# Patient Record
Sex: Male | Born: 1975 | Race: Black or African American | Hispanic: No | Smoking: Current every day smoker
Health system: Southern US, Community
[De-identification: ages and names within clinical notes are randomized; demographics above are authoritative.]

## PROBLEM LIST (undated history)

## (undated) DIAGNOSIS — I472 Ventricular tachycardia, unspecified: Secondary | ICD-10-CM

## (undated) DIAGNOSIS — I5082 Biventricular heart failure: Secondary | ICD-10-CM

## (undated) DIAGNOSIS — F1010 Alcohol abuse, uncomplicated: Secondary | ICD-10-CM

## (undated) DIAGNOSIS — K7460 Unspecified cirrhosis of liver: Secondary | ICD-10-CM

## (undated) DIAGNOSIS — I509 Heart failure, unspecified: Secondary | ICD-10-CM

## (undated) DIAGNOSIS — I10 Essential (primary) hypertension: Secondary | ICD-10-CM

---

## 2015-05-18 HISTORY — PX: ICD IMPLANT: EP1208

## 2020-09-03 ENCOUNTER — Encounter (HOSPITAL_COMMUNITY): Payer: Self-pay

## 2020-09-03 ENCOUNTER — Emergency Department (HOSPITAL_COMMUNITY): Payer: Medicare Other

## 2020-09-03 ENCOUNTER — Inpatient Hospital Stay (HOSPITAL_COMMUNITY)
Admission: EM | Admit: 2020-09-03 | Discharge: 2020-09-10 | DRG: 314 | Disposition: A | Discharge status: Home / Self Care | Payer: Medicare Other | Attending: Internal Medicine | Admitting: Internal Medicine

## 2020-09-03 DIAGNOSIS — Z95810 Presence of automatic (implantable) cardiac defibrillator: Secondary | ICD-10-CM | POA: Diagnosis not present

## 2020-09-03 DIAGNOSIS — E876 Hypokalemia: Secondary | ICD-10-CM | POA: Diagnosis not present

## 2020-09-03 DIAGNOSIS — G4700 Insomnia, unspecified: Secondary | ICD-10-CM | POA: Diagnosis present

## 2020-09-03 DIAGNOSIS — Z888 Allergy status to other drugs, medicaments and biological substances status: Secondary | ICD-10-CM

## 2020-09-03 DIAGNOSIS — F10288 Alcohol dependence with other alcohol-induced disorder: Secondary | ICD-10-CM | POA: Diagnosis present

## 2020-09-03 DIAGNOSIS — I472 Ventricular tachycardia: Secondary | ICD-10-CM | POA: Diagnosis present

## 2020-09-03 DIAGNOSIS — E1165 Type 2 diabetes mellitus with hyperglycemia: Secondary | ICD-10-CM | POA: Diagnosis not present

## 2020-09-03 DIAGNOSIS — R069 Unspecified abnormalities of breathing: Secondary | ICD-10-CM

## 2020-09-03 DIAGNOSIS — Z5900 Homelessness unspecified: Secondary | ICD-10-CM

## 2020-09-03 DIAGNOSIS — I110 Hypertensive heart disease with heart failure: Secondary | ICD-10-CM | POA: Diagnosis present

## 2020-09-03 DIAGNOSIS — R002 Palpitations: Secondary | ICD-10-CM | POA: Diagnosis present

## 2020-09-03 DIAGNOSIS — I5043 Acute on chronic combined systolic (congestive) and diastolic (congestive) heart failure: Secondary | ICD-10-CM | POA: Diagnosis present

## 2020-09-03 DIAGNOSIS — I420 Dilated cardiomyopathy: Secondary | ICD-10-CM | POA: Diagnosis not present

## 2020-09-03 DIAGNOSIS — G4733 Obstructive sleep apnea (adult) (pediatric): Secondary | ICD-10-CM | POA: Diagnosis present

## 2020-09-03 DIAGNOSIS — M25512 Pain in left shoulder: Secondary | ICD-10-CM | POA: Diagnosis present

## 2020-09-03 DIAGNOSIS — I428 Other cardiomyopathies: Secondary | ICD-10-CM | POA: Diagnosis not present

## 2020-09-03 DIAGNOSIS — Z20822 Contact with and (suspected) exposure to covid-19: Secondary | ICD-10-CM | POA: Diagnosis present

## 2020-09-03 DIAGNOSIS — I5082 Biventricular heart failure: Secondary | ICD-10-CM | POA: Diagnosis present

## 2020-09-03 DIAGNOSIS — Z6831 Body mass index (BMI) 31.0-31.9, adult: Secondary | ICD-10-CM | POA: Diagnosis not present

## 2020-09-03 DIAGNOSIS — Z9119 Patient's noncompliance with other medical treatment and regimen: Secondary | ICD-10-CM

## 2020-09-03 DIAGNOSIS — I4901 Ventricular fibrillation: Secondary | ICD-10-CM | POA: Diagnosis present

## 2020-09-03 DIAGNOSIS — F10231 Alcohol dependence with withdrawal delirium: Secondary | ICD-10-CM | POA: Diagnosis present

## 2020-09-03 DIAGNOSIS — R079 Chest pain, unspecified: Secondary | ICD-10-CM | POA: Diagnosis present

## 2020-09-03 DIAGNOSIS — R569 Unspecified convulsions: Secondary | ICD-10-CM | POA: Diagnosis present

## 2020-09-03 DIAGNOSIS — Z79899 Other long term (current) drug therapy: Secondary | ICD-10-CM

## 2020-09-03 DIAGNOSIS — F17210 Nicotine dependence, cigarettes, uncomplicated: Secondary | ICD-10-CM | POA: Diagnosis present

## 2020-09-03 DIAGNOSIS — I77810 Thoracic aortic ectasia: Secondary | ICD-10-CM | POA: Diagnosis present

## 2020-09-03 DIAGNOSIS — Z7982 Long term (current) use of aspirin: Secondary | ICD-10-CM

## 2020-09-03 DIAGNOSIS — F1020 Alcohol dependence, uncomplicated: Secondary | ICD-10-CM | POA: Diagnosis not present

## 2020-09-03 DIAGNOSIS — I4721 Torsades de pointes: Secondary | ICD-10-CM

## 2020-09-03 DIAGNOSIS — I426 Alcoholic cardiomyopathy: Principal | ICD-10-CM | POA: Diagnosis present

## 2020-09-03 DIAGNOSIS — F411 Generalized anxiety disorder: Secondary | ICD-10-CM | POA: Diagnosis present

## 2020-09-03 DIAGNOSIS — Z79890 Hormone replacement therapy (postmenopausal): Secondary | ICD-10-CM

## 2020-09-03 DIAGNOSIS — R7401 Elevation of levels of liver transaminase levels: Secondary | ICD-10-CM | POA: Diagnosis present

## 2020-09-03 DIAGNOSIS — I5023 Acute on chronic systolic (congestive) heart failure: Secondary | ICD-10-CM | POA: Diagnosis not present

## 2020-09-03 DIAGNOSIS — N179 Acute kidney failure, unspecified: Secondary | ICD-10-CM | POA: Diagnosis present

## 2020-09-03 DIAGNOSIS — Z8249 Family history of ischemic heart disease and other diseases of the circulatory system: Secondary | ICD-10-CM

## 2020-09-03 DIAGNOSIS — I479 Paroxysmal tachycardia, unspecified: Secondary | ICD-10-CM | POA: Diagnosis not present

## 2020-09-03 DIAGNOSIS — E669 Obesity, unspecified: Secondary | ICD-10-CM | POA: Diagnosis present

## 2020-09-03 DIAGNOSIS — E039 Hypothyroidism, unspecified: Secondary | ICD-10-CM | POA: Diagnosis present

## 2020-09-03 DIAGNOSIS — R0789 Other chest pain: Secondary | ICD-10-CM | POA: Diagnosis not present

## 2020-09-03 HISTORY — DX: Essential (primary) hypertension: I10

## 2020-09-03 HISTORY — DX: Heart failure, unspecified: I50.9

## 2020-09-03 LAB — RESP PANEL BY RT-PCR (FLU A&B, COVID) ARPGX2
Influenza A by PCR: NEGATIVE
Influenza B by PCR: NEGATIVE
SARS Coronavirus 2 by RT PCR: NEGATIVE

## 2020-09-03 LAB — GLUCOSE, CAPILLARY: Glucose-Capillary: 185 mg/dL — ABNORMAL HIGH (ref 70–99)

## 2020-09-03 LAB — HEPATIC FUNCTION PANEL
ALT: 40 U/L (ref 0–44)
AST: 98 U/L — ABNORMAL HIGH (ref 15–41)
Albumin: 3.5 g/dL (ref 3.5–5.0)
Alkaline Phosphatase: 215 U/L — ABNORMAL HIGH (ref 38–126)
Bilirubin, Direct: 0.5 mg/dL — ABNORMAL HIGH (ref 0.0–0.2)
Indirect Bilirubin: 1.1 mg/dL — ABNORMAL HIGH (ref 0.3–0.9)
Total Bilirubin: 1.6 mg/dL — ABNORMAL HIGH (ref 0.3–1.2)
Total Protein: 6.7 g/dL (ref 6.5–8.1)

## 2020-09-03 LAB — BASIC METABOLIC PANEL
Anion gap: 26 — ABNORMAL HIGH (ref 5–15)
BUN: 13 mg/dL (ref 6–20)
CO2: 29 mmol/L (ref 22–32)
Calcium: 7.2 mg/dL — ABNORMAL LOW (ref 8.9–10.3)
Chloride: 83 mmol/L — ABNORMAL LOW (ref 98–111)
Creatinine, Ser: 1.65 mg/dL — ABNORMAL HIGH (ref 0.61–1.24)
GFR, Estimated: 52 mL/min — ABNORMAL LOW (ref >60)
Glucose, Bld: 161 mg/dL — ABNORMAL HIGH (ref 70–99)
Potassium: 3.1 mmol/L — ABNORMAL LOW (ref 3.5–5.1)
Sodium: 138 mmol/L (ref 135–145)

## 2020-09-03 LAB — CBC
HCT: 39.9 % (ref 39.0–52.0)
Hemoglobin: 13.6 g/dL (ref 13.0–17.0)
MCH: 28.3 pg (ref 26.0–34.0)
MCHC: 34.1 g/dL (ref 30.0–36.0)
MCV: 83.1 fL (ref 80.0–100.0)
Platelets: 206 10*3/uL (ref 150–400)
RBC: 4.8 MIL/uL (ref 4.22–5.81)
RDW: 16.3 % — ABNORMAL HIGH (ref 11.5–15.5)
WBC: 5.7 10*3/uL (ref 4.0–10.5)
nRBC: 1.2 % — ABNORMAL HIGH (ref 0.0–0.2)

## 2020-09-03 LAB — I-STAT CHEM 8, ED
BUN: 17 mg/dL (ref 6–20)
Calcium, Ion: 0.67 mmol/L — CL (ref 1.15–1.40)
Chloride: 84 mmol/L — ABNORMAL LOW (ref 98–111)
Creatinine, Ser: 1.6 mg/dL — ABNORMAL HIGH (ref 0.61–1.24)
Glucose, Bld: 164 mg/dL — ABNORMAL HIGH (ref 70–99)
HCT: 45 % (ref 39.0–52.0)
Hemoglobin: 15.3 g/dL (ref 13.0–17.0)
Potassium: 3.3 mmol/L — ABNORMAL LOW (ref 3.5–5.1)
Sodium: 135 mmol/L (ref 135–145)
TCO2: 33 mmol/L — ABNORMAL HIGH (ref 22–32)

## 2020-09-03 LAB — TROPONIN I (HIGH SENSITIVITY): Troponin I (High Sensitivity): 84 ng/L — ABNORMAL HIGH (ref <18)

## 2020-09-03 LAB — MAGNESIUM: Magnesium: 2.5 mg/dL — ABNORMAL HIGH (ref 1.7–2.4)

## 2020-09-03 LAB — TSH: TSH: 6.782 u[IU]/mL — ABNORMAL HIGH (ref 0.350–4.500)

## 2020-09-03 MED ORDER — DOCUSATE SODIUM 100 MG PO CAPS: 100.0000 mg | ORAL_CAPSULE | Freq: Two times a day (BID) | ORAL | Status: DC | PRN

## 2020-09-03 MED ORDER — AMIODARONE HCL IN DEXTROSE 360-4.14 MG/200ML-% IV SOLN
30.0000 mg/h | INTRAVENOUS | Status: DC
Start: 2020-09-04 — End: 2020-09-03

## 2020-09-03 MED ORDER — ASPIRIN EC 81 MG PO TBEC
81.0000 mg | DELAYED_RELEASE_TABLET | Freq: Every day | ORAL | Status: DC
  Administered 2020-09-04 – 2020-09-10 (×7): 81 mg via ORAL
  Filled 2020-09-03 (×7): qty 1

## 2020-09-03 MED ORDER — POLYETHYLENE GLYCOL 3350 17 G PO PACK: 17.0000 g | PACK | Freq: Every day | ORAL | Status: DC | PRN

## 2020-09-03 MED ORDER — MAGNESIUM SULFATE 4 GM/100ML IV SOLN: 4.0000 g | Freq: Once | INTRAVENOUS | Status: AC

## 2020-09-03 MED ORDER — MIDAZOLAM HCL 2 MG/2ML IJ SOLN: 1.0000 mg | INTRAMUSCULAR | Status: DC | PRN

## 2020-09-03 MED ORDER — PHENOBARBITAL SODIUM 65 MG/ML IJ SOLN: 65.0000 mg | Freq: Two times a day (BID) | INTRAMUSCULAR | Status: DC

## 2020-09-03 MED ORDER — INSULIN ASPART 100 UNIT/ML IJ SOLN
0.0000 [IU] | INTRAMUSCULAR | Status: DC
  Administered 2020-09-04 – 2020-09-05 (×2): 3 [IU] via SUBCUTANEOUS

## 2020-09-03 MED ORDER — MAGNESIUM SULFATE 50 % IJ SOLN
INTRAMUSCULAR | Status: AC | PRN
  Administered 2020-09-03: 2 g via INTRAVENOUS

## 2020-09-03 MED ORDER — ADULT MULTIVITAMIN W/MINERALS CH
1.0000 | ORAL_TABLET | Freq: Every day | ORAL | Status: DC
  Administered 2020-09-04 – 2020-09-10 (×7): 1 via ORAL
  Filled 2020-09-03 (×7): qty 1

## 2020-09-03 MED ORDER — ONDANSETRON HCL 4 MG/2ML IJ SOLN: 4.0000 mg | Freq: Four times a day (QID) | INTRAMUSCULAR | Status: DC | PRN

## 2020-09-03 MED ORDER — PHENOBARBITAL SODIUM 65 MG/ML IJ SOLN
130.0000 mg | Freq: Three times a day (TID) | INTRAMUSCULAR | Status: DC
  Administered 2020-09-03: 130 mg via INTRAVENOUS
  Filled 2020-09-03 (×2): qty 2

## 2020-09-03 MED ORDER — HEPARIN SODIUM (PORCINE) 5000 UNIT/ML IJ SOLN
5000.0000 [IU] | Freq: Three times a day (TID) | INTRAMUSCULAR | Status: DC
  Administered 2020-09-03 – 2020-09-07 (×11): 5000 [IU] via SUBCUTANEOUS
  Filled 2020-09-03 (×15): qty 1

## 2020-09-03 MED ORDER — MAGNESIUM SULFATE 2 GM/50ML IV SOLN
INTRAVENOUS | Status: AC
  Administered 2020-09-03: 4 mg via INTRAVENOUS
  Filled 2020-09-03: qty 50

## 2020-09-03 MED ORDER — CARVEDILOL 12.5 MG PO TABS
12.5000 mg | ORAL_TABLET | Freq: Two times a day (BID) | ORAL | Status: DC
  Administered 2020-09-04 – 2020-09-08 (×10): 12.5 mg via ORAL
  Filled 2020-09-03 (×10): qty 1

## 2020-09-03 MED ORDER — AMIODARONE HCL IN DEXTROSE 360-4.14 MG/200ML-% IV SOLN
INTRAVENOUS | Status: AC
  Administered 2020-09-03: 60 mg/h via INTRAVENOUS
  Filled 2020-09-03: qty 200

## 2020-09-03 MED ORDER — FOLIC ACID 1 MG PO TABS
1.0000 mg | ORAL_TABLET | Freq: Every day | ORAL | Status: DC
  Administered 2020-09-04 – 2020-09-10 (×7): 1 mg via ORAL
  Filled 2020-09-03 (×8): qty 1

## 2020-09-03 MED ORDER — MAGNESIUM SULFATE 2 GM/50ML IV SOLN
INTRAVENOUS | Status: AC
  Administered 2020-09-03: 2 g
  Filled 2020-09-03: qty 50

## 2020-09-03 MED ORDER — LORAZEPAM 2 MG/ML IJ SOLN
2.0000 mg | Freq: Once | INTRAMUSCULAR | Status: AC
  Administered 2020-09-03: 2 mg via INTRAVENOUS
  Filled 2020-09-03: qty 1

## 2020-09-03 MED ORDER — CALCIUM GLUCONATE-NACL 1-0.675 GM/50ML-% IV SOLN
1.0000 g | Freq: Once | INTRAVENOUS | Status: AC
  Administered 2020-09-03: 1000 mg via INTRAVENOUS
  Filled 2020-09-03: qty 50

## 2020-09-03 MED ORDER — AMIODARONE HCL IN DEXTROSE 360-4.14 MG/200ML-% IV SOLN: 60.0000 mg/h | INTRAVENOUS | Status: DC

## 2020-09-03 MED ORDER — MAGNESIUM SULFATE 2 GM/50ML IV SOLN
2.0000 g | Freq: Once | INTRAVENOUS | Status: AC
  Administered 2020-09-03: 2 g via INTRAVENOUS
  Filled 2020-09-03: qty 50

## 2020-09-03 MED ORDER — DEXMEDETOMIDINE HCL IN NACL 400 MCG/100ML IV SOLN
0.2000 ug/kg/h | INTRAVENOUS | Status: DC
  Administered 2020-09-03: 0.2 ug/kg/h via INTRAVENOUS
  Administered 2020-09-04: 0.3 ug/kg/h via INTRAVENOUS
  Filled 2020-09-03 (×2): qty 100

## 2020-09-03 MED ORDER — POTASSIUM CHLORIDE 10 MEQ/100ML IV SOLN
10.0000 meq | INTRAVENOUS | Status: AC
  Administered 2020-09-03 – 2020-09-04 (×6): 10 meq via INTRAVENOUS
  Filled 2020-09-03 (×6): qty 100

## 2020-09-03 MED ORDER — CHLORHEXIDINE GLUCONATE CLOTH 2 % EX PADS
6.0000 | MEDICATED_PAD | Freq: Every day | CUTANEOUS | Status: DC
  Administered 2020-09-04 – 2020-09-05 (×2): 6 via TOPICAL

## 2020-09-03 MED ORDER — PHENOBARBITAL SODIUM 65 MG/ML IJ SOLN: 65.0000 mg | Freq: Three times a day (TID) | INTRAMUSCULAR | Status: DC

## 2020-09-03 MED ORDER — THIAMINE HCL 100 MG PO TABS
100.0000 mg | ORAL_TABLET | Freq: Every day | ORAL | Status: DC
  Administered 2020-09-04 – 2020-09-10 (×7): 100 mg via ORAL
  Filled 2020-09-03 (×7): qty 1

## 2020-09-03 NOTE — ED Provider Notes (Signed)
[facility] EMERGENCY DEPARTMENT Provider Note   CSN: [id number] Arrival date & time: 09/03/20  2004     History No chief complaint on file.   Darryl Mitchell is a 45 y.o. male.  The history is provided by the EMS personnel, the patient, the spouse and medical records. No language interpreter was used.  Palpitations Palpitations quality:  Fast Onset quality:  Sudden Timing:  Intermittent Progression:  Worsening Chronicity:  Recurrent Relieved by: icd defibrillation. Ineffective treatments:  None tried Associated symptoms: chest pain, chest pressure, malaise/fatigue, nausea and shortness of breath   Associated symptoms: no back pain, no cough and no leg pain       Past Medical History:  Diagnosis Date   CHF (congestive heart failure) (HCC)    Hypertension     There are no problems to display for this patient.    No family history on file.     Home Medications Prior to Admission medications   Not on File    Allergies    Lisinopril  Review of Systems   Review of Systems  Unable to perform ROS: Mental status change (Intermittently going into V. fib, difficult historian due to this)  Constitutional:  Positive for fatigue and malaise/fatigue.  HENT:  Negative for congestion.   Respiratory:  Positive for chest tightness and shortness of breath. Negative for cough and wheezing.   Cardiovascular:  Positive for chest pain, palpitations and leg swelling.  Gastrointestinal:  Positive for nausea. Negative for abdominal pain, constipation and diarrhea.  Genitourinary:  Negative for dysuria and frequency.  Musculoskeletal:  Negative for back pain.  Skin:  Negative for rash and wound.  Neurological:  Positive for syncope and light-headedness.  Psychiatric/Behavioral:  Negative for agitation.    Physical Exam Updated Vital Signs BP (!) 147/108   Pulse 98   Temp 99.1 F (37.3 C) (Temporal)   Resp 18   SpO2 94%   Physical Exam Vitals and nursing note  reviewed.  Constitutional:      Appearance: He is well-developed.  HENT:     Head: Normocephalic and atraumatic.     Mouth/Throat:     Mouth: Mucous membranes are moist.     Pharynx: No oropharyngeal exudate.  Eyes:     Extraocular Movements: Extraocular movements intact.     Conjunctiva/sclera: Conjunctivae normal.     Pupils: Pupils are equal, round, and reactive to light.  Cardiovascular:     Rate and Rhythm: Regular rhythm. Tachycardia present.     Pulses: Normal pulses.     Heart sounds: No murmur heard. Pulmonary:     Effort: Pulmonary effort is normal. No respiratory distress.     Breath sounds: Normal breath sounds. No wheezing, rhonchi or rales.  Chest:     Chest wall: No tenderness.  Abdominal:     General: Abdomen is flat.     Palpations: Abdomen is soft.     Tenderness: There is no abdominal tenderness.  Musculoskeletal:        General: No tenderness.     Cervical back: Neck supple. No tenderness.     Right lower leg: Edema present.     Left lower leg: Edema present.  Skin:    General: Skin is warm and dry.     Capillary Refill: Capillary refill takes less than 2 seconds.     Findings: No erythema or rash.  Neurological:     General: No focal deficit present.     Mental Status: He is  alert.    ED Results / Procedures / Treatments   Labs (all labs ordered are listed, but only abnormal results are displayed) Labs Reviewed  BASIC METABOLIC PANEL - Abnormal; Notable for the following components:      Result Value   Potassium 3.1 (*)    Chloride 83 (*)    Glucose, Bld 161 (*)    Creatinine, Ser 1.65 (*)    Calcium 7.2 (*)    GFR, Estimated 52 (*)    Anion gap 26 (*)    All other components within normal limits  CBC - Abnormal; Notable for the following components:   RDW 16.3 (*)    nRBC 1.2 (*)    All other components within normal limits  HEPATIC FUNCTION PANEL - Abnormal; Notable for the following components:   AST 98 (*)    Alkaline Phosphatase 215  (*)    Total Bilirubin 1.6 (*)    Bilirubin, Direct 0.5 (*)    Indirect Bilirubin 1.1 (*)    All other components within normal limits  MAGNESIUM - Abnormal; Notable for the following components:   Magnesium 2.5 (*)    All other components within normal limits  TSH - Abnormal; Notable for the following components:   TSH 6.782 (*)    All other components within normal limits  GLUCOSE, CAPILLARY - Abnormal; Notable for the following components:   Glucose-Capillary 185 (*)    All other components within normal limits  I-STAT CHEM 8, ED - Abnormal; Notable for the following components:   Potassium 3.3 (*)    Chloride 84 (*)    Creatinine, Ser 1.60 (*)    Glucose, Bld 164 (*)    Calcium, Ion 0.67 (*)    TCO2 33 (*)    All other components within normal limits  TROPONIN I (HIGH SENSITIVITY) - Abnormal; Notable for the following components:   Troponin I (High Sensitivity) 84 (*)    All other components within normal limits  RESP PANEL BY RT-PCR (FLU A&B, COVID) ARPGX2  MRSA PCR SCREENING  BASIC METABOLIC PANEL  HEPATIC FUNCTION PANEL  MAGNESIUM  PHOSPHORUS  HIV ANTIBODY (ROUTINE TESTING W REFLEX)  HEMOGLOBIN A1C  TROPONIN I (HIGH SENSITIVITY)    EKG EKG Interpretation  Date/Time:  Thursday September 03 2020 76:14:70 EDT Ventricular Rate:  97 PR Interval:  198 QRS Duration: 99 QT Interval:  378 QTC Calculation: 481 R Axis:   -33 Text Interpretation: Sinus rhythm Ventricular premature complex Aberrant conduction of SV complex(es) Borderline prolonged PR interval Left atrial enlargement Left axis deviation Consider anterior infarct no prior ECG for comparison. No STEMI Confirmed by Theda Belfast ([id number]) on 09/04/2020 12:35:52 AM  Radiology DG Chest Portable 1 View  Result Date: 09/03/2020 CLINICAL DATA:  Chest pain EXAM: PORTABLE CHEST 1 VIEW COMPARISON:  None. FINDINGS: Lungs are clear. No pneumothorax or pleural effusion. Implanted epicardial defibrillator noted. Cardiac size is  mildly enlarged. Pulmonary vascularity is normal. No acute bone abnormality. IMPRESSION: Mild cardiomegaly. Electronically Signed   By: Helyn Numbers MD   On: 09/03/2020 21:02    Procedures Procedures    CRITICAL CARE Performed by: Canary Brim Tammy Ericsson Total critical care time: 60 minutes Critical care time was exclusive of separately billable procedures and treating other patients. Critical care was necessary to treat or prevent imminent or life-threatening deterioration. Critical care was time spent personally by me on the following activities: development of treatment plan with patient and/or surrogate as well as nursing, discussions with consultants, evaluation  of patient's response to treatment, examination of patient, obtaining history from patient or surrogate, ordering and performing treatments and interventions, ordering and review of laboratory studies, ordering and review of radiographic studies, pulse oximetry and re-evaluation of patient's condition.   Medications Ordered in ED Medications  PHENObarbital (LUMINAL) injection 130 mg (130 mg Intravenous Given 09/03/20 2147)    Followed by  PHENObarbital (LUMINAL) injection 65 mg (has no administration in time range)    Followed by  PHENObarbital (LUMINAL) injection 65 mg (has no administration in time range)  dexmedetomidine (PRECEDEX) 400 MCG/100ML (4 mcg/mL) infusion (0.5 mcg/kg/hr  108.9 kg Intravenous Infusion Verify 09/04/20 0000)  midazolam (VERSED) injection 1-2 mg (has no administration in time range)  folic acid (FOLVITE) tablet 1 mg (0 mg Oral Hold 09/04/20 0009)  multivitamin with minerals tablet 1 tablet (0 tablets Oral Hold 09/04/20 0009)  thiamine tablet 100 mg (0 mg Oral Hold 09/04/20 0010)  potassium chloride 10 mEq in 100 mL IVPB (10 mEq Intravenous New Bag/Given 09/04/20 0028)  docusate sodium (COLACE) capsule 100 mg (has no administration in time range)  polyethylene glycol (MIRALAX / GLYCOLAX) packet 17 g (has no  administration in time range)  heparin injection 5,000 Units (5,000 Units Subcutaneous Given 09/03/20 2147)  aspirin EC tablet 81 mg (0 mg Oral Hold 09/04/20 0009)  insulin aspart (novoLOG) injection 0-15 Units (3 Units Subcutaneous Given 09/04/20 0028)  carvedilol (COREG) tablet 12.5 mg (has no administration in time range)  Chlorhexidine Gluconate Cloth 2 % PADS 6 each (6 each Topical Given 09/04/20 0000)  magnesium sulfate IVPB 2 g 50 mL (0 g Intravenous Stopped 09/03/20 2235)  magnesium sulfate IVPB 4 g 100 mL (0 g Intravenous Stopped 09/03/20 2313)  magnesium sulfate 2 GM/50ML IVPB (2 g  New Bag/Given 09/03/20 2140)  magnesium sulfate (IV Push/IM) injection (2 g Intravenous Given 09/03/20 2137)  LORazepam (ATIVAN) injection 2 mg (2 mg Intravenous Given 09/03/20 2230)  calcium gluconate 1 g/ 50 mL sodium chloride IVPB (0 mg Intravenous Stopped 09/03/20 2313)    ED Course  I have reviewed the triage vital signs and the nursing notes.  Pertinent labs & imaging results that were available during my care of the patient were reviewed by me and considered in my medical decision making (see chart for details).    MDM Rules/Calculators/A&P                          Morrill Bomkamp is a 45 y.o. male with a past medical history sniffing for alcohol abuse, hypertension, and CHF status post ICD placement who presents with multiple syncopal episodes and V. fib.  Patient reports that he has had some difficulty quitting drinking in the past and had his last drink several days ago.  He reports that he has been at a rehab facility and has been having episodes of passing out and then today was having episodes of lightheadedness and passing out episodes.  EMS found him to be going in and out of V. fib/V. tach as well as torsades.  Patient reports his defibrillator has fired "too many times to count" over the last 2 days and EMS reports that they have shocked him several times as well.  They gave amiodarone and brought him to the  emergency department for evaluation.  On arrival, patient is going in and out of torsades/V. fib.  We immediately started him on magnesium infusion and bolus as well as giving him continued amiodarone.  We called cardiology who will come see the patient.  We called critical care who will come see the patient.  Initially, we were going to intubate him for airway protection due to this continued ventricular fibrillation and torsades however after the magnesium he began to have some more stability with his rhythm.    after getting some magnesium, his heart rate went into a sinus rhythm and stayed there for some time.  He did have another episode after the initial 6 g of magnesium completed or he went back into torsades several times requiring several shocks both of his defibrillator and externally with our defibrillator.  In between these episodes patient is mentating well.  No evidence of postictal period or seizures.  After more magnesium, he did not have any further episodes of torsades.  He did have some nausea and vomiting so we gave some Ativan so as not to prolong QTC.  Patient mated to ICU for further management of her chronic torsades de points likely related to electrolyte imbalance.  Patient was given some calcium prior to admission as well with hypocalcemia discovery.  Patient admitted for further management.   Final Clinical Impression(s) / ED Diagnoses Final diagnoses:  Torsades de pointes (HCC)  Ventricular fibrillation (HCC)     Clinical Impression: 1. Torsades de pointes (HCC)   2. ICD (implantable cardioverter-defibrillator) in place   3. Ventricular fibrillation (HCC)     Disposition: Admit  This note was prepared with assistance of Dragon voice recognition software. Occasional wrong-word or sound-a-like substitutions may have occurred due to the inherent limitations of voice recognition software.     Cristabel Bicknell, Canary Brim, MD 09/04/20 0040

## 2020-09-03 NOTE — Consult Note (Signed)
Cardiology Consult    Patient ID: Dia Jefferys MRN: [id number], DOB/AGE: [birth year]   Admit date: 09/03/2020 Date of Consult: 09/03/2020 Requesting Provider: Lynden Oxford  PCP:  No primary care provider on file.   [facility] Providers Cardiologist:  None       Patient Profile    Darryl Mitchell is a 45 y.o. male without prior history in our system who presented with recurrent polymorphic VT in the setting of alcohol withdrawal whom we are consulted for arrhythmia.  History of Present Illness    This is a 45 year old male with a history of reported heart failure and prior implantation of [employer] S-ICD who presented with recurrent ventricular arrhythmias.  On arrival to ED he had multiple episodes of brief and sustained polymorphic VT requiring defibrillation x3 times.  He was bolused with amiodarone and started on an amiodarone infusion with 2 gm magnesium.  Advised more magnesium and, in response to push of IV Mg++, amount of ectopy quieted down.  He reports numerous shocks in the last 24 hours.  In interrogated his S-ICD in the ED and this confirmed 18 episodes in last 24 hours for which he received therapy 20 times.  He reports prior longstanding history of alcoholism and apparently has been sober for the last two years but relapsed about two months ago.  He decided to detox and went cold [place] ~ 2 days ago with subsequent development of severe alcohol withdrawal syndrome.  He denies chest pain, shortness of breath, orthopnea.  He did have some leg swelling but this improved.  He can't remember most of his medications but tells me he takes carvedilol and furosemide.  The only dose he can remember is carvedilol 25 mg BID.  He was diagnosed with NICM (reports negative cath) several years ago for which he had his SQ-ICD implanted.  He doesn't remember a cause and he's not sure if it was presumed alcoholic at the time.  Past Medical History   Past Medical History:  Diagnosis Date   CHF  (congestive heart failure) (HCC)    Hypertension        Allergies  Allergen Reactions   Lisinopril Swelling   Inpatient Medications     folic acid  1 mg Oral Daily   multivitamin with minerals  1 tablet Oral Daily   PHENObarbital  130 mg Intravenous Q8H   Followed by   Melene Muller ON 09/05/2020] PHENObarbital  65 mg Intravenous Q8H   Followed by   Melene Muller ON 09/06/2020] PHENObarbital  65 mg Intravenous BID   thiamine  100 mg Oral Daily    Family History    No family history on file. has no family status information on file.    Social History    Social History   Socioeconomic History   Marital status: Not on file    Spouse name: Not on file   Number of children: Not on file   Years of education: Not on file   Highest education level: Not on file  Occupational History   Not on file  Tobacco Use   Smoking status: Not on file   Smokeless tobacco: Not on file  Substance and Sexual Activity   Alcohol use: Not on file   Drug use: Not on file   Sexual activity: Not on file  Other Topics Concern   Not on file  Social History Narrative   Not on file   Social Determinants of Health   Financial Resource Strain: Not on  file  Food Insecurity: Not on file  Transportation Needs: Not on file  Physical Activity: Not on file  Stress: Not on file  Social Connections: Not on file  Intimate Partner Violence: Not on file     Review of Systems    General:  No chills, fever, night sweats or weight changes.  Cardiovascular:  No chest pain, dyspnea on exertion, edema, orthopnea, palpitations, paroxysmal nocturnal dyspnea. Dermatological: No rash, lesions/masses Respiratory: No cough, dyspnea Urologic: No hematuria, dysuria Abdominal:   No nausea, vomiting, diarrhea, bright red blood per rectum, melena, or hematemesis Neurologic:  No visual changes, wkns, changes in mental status. All other systems reviewed and are otherwise negative except as noted above.  Physical Exam     Blood pressure (!) 147/108, pulse 98, temperature 99.1 F (37.3 C), temperature source Temporal, resp. rate 18, SpO2 94 %.     Intake/Output Summary (Last 24 hours) at 09/03/2020 2053 Last data filed at 09/03/2020 2039 Gross per 24 hour  Intake 50 ml  Output --  Net 50 ml   Wt Readings from Last 3 Encounters:  No data found for Wt    CONSTITUTIONAL: Disheveled but alert and conversant HEENT: normal NECK: no JVD, no masses CARDIAC: RRR normal S1/S2 no MGR VASCULAR: Radial pulses intact bilaterally. No carotid bruits. PULMONARY/CHEST WALL: no deformities, normal breath sounds bilaterally, normal work of breathing, nontender SQ-ICD implant L axilla. ABDOMINAL: soft, non-tender, non-distended EXTREMITIES: no edema, no muscle atrophy, warm and well-perfused SKIN: Dry and intact without apparent rashes or wounds. No peripheral cyanosis. NEUROLOGIC: alert, no abnormal movements, cranial nerves grossly intact. PSYCH: normal affect, normal speech and language   Labs    5.7>13.6<206 K 3.3 Cr 1.6, iCal 0.67   Radiology Studies    CXR reviewed cardiomegaly otherwise unremarkable.  ECG & Cardiac Imaging    NSR, LAD, no overt iscehmia, delayed R-wave progression, QTc is prolonged.  Assessment & Plan    45 year old male with history of reported non-ischemic cardiomyopathy outside of our system (in [place]), alcohol use disorder, obesity presenting with severe acute alcohol withdrawal syndrome and recurrent PMVT with prolonged QT (Torsades).  Suspect QT segment abnormalities are in part related to electrolyte abnormalities given prompt response to magnesium with adrenergic surge driving trigger activity in the setting of alcohol withdrawal.  Recommend aggressive electrolyte resuscitation and treatment of his alcohol withdrawal syndrome which should be effective in suppressing arrhythmia.  He's a poor historian from the perspective of his medications but he does report full dose  carvedilol.  Reasonable to resume this to aid in PVC suppression but would resume at half dose.  Can advise on the rest of his regimen once we have more information.  #Torsades/PMVT #Prolonged QTc #Hypokalemia #Hypocalcemia - IV Mg++ up to 8 grams acutely for membrane stabilizing - Aggressively replete electrolytes - Treat underlyling alcohol withdrawal - Would recommend stopping amiodarone for now as this is contributing to prolongation of vulnerable period.  #History of cardiomyopathy - Resume BB @ half-dose, carvedilol 12.5 mg BID - Suspect degree of AKI (Cr 1.6); would observe for recovery and introduce ARB (note prior ACEi intolerance) - Approximately euvolemic; hold diuretics while resuscitating - TTE  Signed, Regino Schultze, MD 09/03/2020, 8:53 PM  For questions or updates, please contact   Please consult [email or web address] for contact info under Cardiology/STEMI.

## 2020-09-03 NOTE — ED Notes (Signed)
Paged Critical Care again for Dr. Rush Landmark

## 2020-09-03 NOTE — ED Notes (Signed)
Cards and CCM at bedside

## 2020-09-03 NOTE — Progress Notes (Signed)
eLink Physician-Brief Progress Note Patient Name: Darryl Mitchell DOB: June 05, 1975 MRN: [id number]   Date of Service  09/03/2020  HPI/Events of Note  Patient with ETOH cardiomyopathy (EF in the 25 % range) and a history of recurrent VT secondary to Torsades, he has an AICD, presents with seizure-like activity in the context of  Torsades, treated with  7 shocks and 4 grams of Magnesium, admits to heavy ETOH use up until 2 days ago.  eICU Interventions  New Patient Evaluation.        Thomasene Lot Desiray Orchard 09/03/2020, 11:19 PM

## 2020-09-03 NOTE — Code Documentation (Signed)
In vfib, shocked at 120J

## 2020-09-03 NOTE — ED Notes (Signed)
2137-Torsades rhythm- 200J defibrillation by this RN

## 2020-09-03 NOTE — ED Notes (Signed)
Attempted report 

## 2020-09-03 NOTE — H&P (Signed)
NAME:  Darryl Mitchell, MRN:  [id number], DOB:  01/06/1976, LOS: 0 ADMISSION DATE:  09/03/2020, CONSULTATION DATE:  09/03/20 REFERRING MD:  Tegeler, CHIEF COMPLAINT:  chest pain   History of Present Illness:  45 year old man presenting with seizure-like activity and recurrent torsades converted with defib x 7 and mag x 4g.  Had some vague left chest and shoulder pain preceding.  Cardiology to see.  Admits to heavy EtOH use last drink 2 days ago, never had w/d this bad before.  Does have an AICD that also shocked him, this is going to be interrogated.  Visiting from [place] does not know his meds.  Pertinent  Medical History  HTN CHF s/p AICD (he says 25% ef)  Significant Hospital Events: Including procedures, antibiotic start and stop dates in addition to other pertinent events   6/9 admitted  Interim History / Subjective:  Consulted.  Objective   Blood pressure (!) 147/108, pulse 98, temperature 99.1 F (37.3 C), temperature source Temporal, resp. rate 18, SpO2 94 %.        Intake/Output Summary (Last 24 hours) at 09/03/2020 2048 Last data filed at 09/03/2020 2039 Gross per 24 hour  Intake 50 ml  Output --  Net 50 ml   There were no vitals filed for this visit.  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  Defib x 6-7, 4g mag given.  EKG without STEMI. - F/u Mg/Trop - Check  echo - Generous with mg - Amio per cardiology  Severe EtOH w/d - Start phenobarb taper, PRN ativan, precedex if refractory - Seizure precautions - Thiamine/folate  Baseline cardiomyopathy - Need to get a med list when he's more stable, he does take 4 baby aspirin a day  Best practice (right click and "Reselect all SmartList Selections" daily)  Diet:  NPO Pain/Anxiety/Delirium protocol (if indicated): No VAP protocol (if indicated): Not indicated DVT prophylaxis: Subcutaneous Heparin GI prophylaxis: N/A Glucose control:  SSI Yes Central venous access:  N/A Arterial line:  N/A Foley:  N/A Mobility:  bed rest  PT consulted: N/A Last date of multidisciplinary goals of care discussion [pending] Code Status:  full code Disposition: ICU  Labs   CBC: Recent Labs  Lab 09/03/20 2036  HGB 15.3  HCT 45.0    Basic Metabolic Panel: Recent Labs  Lab 09/03/20 2036  NA 135  K 3.3*  CL 84*  GLUCOSE 164*  BUN 17  CREATININE 1.60*   GFR: CrCl cannot be calculated (Unknown ideal weight.). No results for input(s): PROCALCITON, WBC, LATICACIDVEN in the last 168 hours.  Liver Function Tests: No results for input(s): AST, ALT, ALKPHOS, BILITOT, PROT, ALBUMIN in the last 168 hours. No results for input(s): LIPASE, AMYLASE in the last 168 hours. No results for input(s): AMMONIA in the last 168 hours.  ABG    Component Value Date/Time   TCO2 33 (H) 09/03/2020 2036  Coagulation Profile: No results for input(s): INR, PROTIME in the last 168 hours.  Cardiac Enzymes: No results for input(s): CKTOTAL, CKMB, CKMBINDEX, TROPONINI in the last 168 hours.  HbA1C: No results found for: HGBA1C  CBG: No results for input(s): GLUCAP in the last 168 hours.  Review of Systems:    Positive Symptoms in bold:  Constitutional fevers, chills, weight loss, fatigue, anorexia, malaise  Eyes decreased vision, double vision, eye irritation  Ears, Nose, Mouth, Throat sore throat, trouble  swallowing, sinus congestion  Cardiovascular chest pain, paroxysmal nocturnal dyspnea, lower ext edema, palpitations   Respiratory SOB, cough, DOE, hemoptysis, wheezing  Gastrointestinal nausea, vomiting, diarrhea  Genitourinary burning with urination, trouble urinating  Musculoskeletal joint aches, joint swelling, back pain  Integumentary  rashes, skin lesions  Neurological focal weakness, focal numbness, trouble speaking, headaches  Psychiatric depression, anxiety, confusion  Endocrine polyuria, polydipsia, cold intolerance, heat intolerance  Hematologic abnormal bruising, abnormal bleeding, unexplained nose bleeds  Allergic/Immunologic recurrent infections, hives, swollen lymph nodes     Past Medical History:  He,  has a past medical history of CHF (congestive heart failure) (HCC) and Hypertension.   Surgical History:  AICD in place   Social History:    Drinks a handle (1/2 gallon) of liquor daily Denies illicits  Family History:  Mother had heart failure  Allergies Allergies  Allergen Reactions   Lisinopril Swelling     Home Medications  Prior to Admission medications   Not on File     Critical care time: 34 minutes not including any separately billable procedures

## 2020-09-03 NOTE — ED Triage Notes (Signed)
Pt comes via [facility], has been having "seizure like activity" in and out of vtach/vfib, torsades had internal defibrillator, given orders for 150mg  amiodarone PTA

## 2020-09-03 NOTE — ED Notes (Signed)
Paged Cardiology and Critical Care per Dr Rush Landmark oral orders.

## 2020-09-03 NOTE — Progress Notes (Signed)
eLink Physician-Brief Progress Note Patient Name: Darryl Mitchell DOB: [birth year] MRN: [id number]   Date of Service  09/03/2020  HPI/Events of Note  Patient has MVI, Thiamine and Aspirin due to be given PO tonight but he is NPO.  eICU Interventions  Hold scheduled PO medications due tonight.        Thomasene Lot Keyler Hoge 09/03/2020, 11:51 PM

## 2020-09-03 NOTE — Code Documentation (Signed)
Pt shocked by his defibrillator

## 2020-09-03 NOTE — ED Notes (Signed)
2147-MD Tegeler shock again 200J w/ resolution of vfib

## 2020-09-03 NOTE — Code Documentation (Signed)
In vifb, shocked at 150J

## 2020-09-04 ENCOUNTER — Inpatient Hospital Stay (HOSPITAL_COMMUNITY): Payer: Medicare Other

## 2020-09-04 DIAGNOSIS — I472 Ventricular tachycardia: Secondary | ICD-10-CM

## 2020-09-04 DIAGNOSIS — I420 Dilated cardiomyopathy: Secondary | ICD-10-CM

## 2020-09-04 LAB — ECHOCARDIOGRAM COMPLETE
AR max vel: 4.51 cm2
AV Area VTI: 3.87 cm2
AV Area mean vel: 4.49 cm2
AV Mean grad: 1 mmHg
AV Peak grad: 2.2 mmHg
Ao pk vel: 0.75 m/s
Area-P 1/2: 3.77 cm2
Calc EF: 3.2 %
MV M vel: 3.65 m/s
MV Peak grad: 53.3 mmHg
S' Lateral: 6.6 cm
Single Plane A2C EF: 6 %
Single Plane A4C EF: 1.8 %
Weight: 3545 oz

## 2020-09-04 LAB — BASIC METABOLIC PANEL
Anion gap: 12 (ref 5–15)
Anion gap: 14 (ref 5–15)
BUN: 10 mg/dL (ref 6–20)
BUN: 11 mg/dL (ref 6–20)
CO2: 39 mmol/L — ABNORMAL HIGH (ref 22–32)
CO2: 34 mmol/L — ABNORMAL HIGH (ref 22–32)
Calcium: 7.1 mg/dL — ABNORMAL LOW (ref 8.9–10.3)
Calcium: 6.9 mg/dL — ABNORMAL LOW (ref 8.9–10.3)
Chloride: 88 mmol/L — ABNORMAL LOW (ref 98–111)
Chloride: 88 mmol/L — ABNORMAL LOW (ref 98–111)
Creatinine, Ser: 1.14 mg/dL (ref 0.61–1.24)
Creatinine, Ser: 1.19 mg/dL (ref 0.61–1.24)
GFR, Estimated: >60 mL/min (ref >60)
GFR, Estimated: >60 mL/min (ref >60)
Glucose, Bld: 105 mg/dL — ABNORMAL HIGH (ref 70–99)
Glucose, Bld: 113 mg/dL — ABNORMAL HIGH (ref 70–99)
Potassium: 2.9 mmol/L — ABNORMAL LOW (ref 3.5–5.1)
Potassium: 3.2 mmol/L — ABNORMAL LOW (ref 3.5–5.1)
Sodium: 139 mmol/L (ref 135–145)
Sodium: 136 mmol/L (ref 135–145)

## 2020-09-04 LAB — RAPID URINE DRUG SCREEN, HOSP PERFORMED
Amphetamines: NOT DETECTED
Barbiturates: POSITIVE — AB
Benzodiazepines: POSITIVE — AB
Cocaine: NOT DETECTED
Opiates: NOT DETECTED
Tetrahydrocannabinol: NOT DETECTED

## 2020-09-04 LAB — TROPONIN I (HIGH SENSITIVITY)
Troponin I (High Sensitivity): 134 ng/L (ref <18)
Troponin I (High Sensitivity): 135 ng/L (ref <18)
Troponin I (High Sensitivity): 74 ng/L — ABNORMAL HIGH (ref <18)

## 2020-09-04 LAB — HEPATIC FUNCTION PANEL
ALT: 37 U/L (ref 0–44)
AST: 82 U/L — ABNORMAL HIGH (ref 15–41)
Albumin: 3.2 g/dL — ABNORMAL LOW (ref 3.5–5.0)
Alkaline Phosphatase: 198 U/L — ABNORMAL HIGH (ref 38–126)
Bilirubin, Direct: 0.4 mg/dL — ABNORMAL HIGH (ref 0.0–0.2)
Indirect Bilirubin: 0.8 mg/dL (ref 0.3–0.9)
Total Bilirubin: 1.2 mg/dL (ref 0.3–1.2)
Total Protein: 6.1 g/dL — ABNORMAL LOW (ref 6.5–8.1)

## 2020-09-04 LAB — HIV ANTIBODY (ROUTINE TESTING W REFLEX): HIV Screen 4th Generation wRfx: NONREACTIVE

## 2020-09-04 LAB — PHOSPHORUS: Phosphorus: 4.1 mg/dL (ref 2.5–4.6)

## 2020-09-04 LAB — GLUCOSE, CAPILLARY
Glucose-Capillary: 102 mg/dL — ABNORMAL HIGH (ref 70–99)
Glucose-Capillary: 111 mg/dL — ABNORMAL HIGH (ref 70–99)
Glucose-Capillary: 112 mg/dL — ABNORMAL HIGH (ref 70–99)
Glucose-Capillary: 113 mg/dL — ABNORMAL HIGH (ref 70–99)
Glucose-Capillary: 116 mg/dL — ABNORMAL HIGH (ref 70–99)
Glucose-Capillary: 125 mg/dL — ABNORMAL HIGH (ref 70–99)

## 2020-09-04 LAB — HEMOGLOBIN A1C
Hgb A1c MFr Bld: 6.6 % — ABNORMAL HIGH (ref 4.8–5.6)
Mean Plasma Glucose: 143 mg/dL

## 2020-09-04 LAB — MRSA PCR SCREENING: MRSA by PCR: NEGATIVE

## 2020-09-04 LAB — MAGNESIUM: Magnesium: 2.3 mg/dL (ref 1.7–2.4)

## 2020-09-04 LAB — TSH: TSH: 4.715 u[IU]/mL — ABNORMAL HIGH (ref 0.350–4.500)

## 2020-09-04 MED ORDER — POTASSIUM CHLORIDE CRYS ER 20 MEQ PO TBCR
20.0000 meq | EXTENDED_RELEASE_TABLET | Freq: Once | ORAL | Status: AC
  Administered 2020-09-04: 20 meq via ORAL
  Filled 2020-09-04: qty 1

## 2020-09-04 MED ORDER — PHENOBARBITAL SODIUM 130 MG/ML IJ SOLN
130.0000 mg | Freq: Three times a day (TID) | INTRAMUSCULAR | Status: AC
  Administered 2020-09-04 – 2020-09-05 (×5): 130 mg via INTRAVENOUS
  Filled 2020-09-04 (×5): qty 1

## 2020-09-04 MED ORDER — POTASSIUM CHLORIDE CRYS ER 20 MEQ PO TBCR
40.0000 meq | EXTENDED_RELEASE_TABLET | Freq: Once | ORAL | Status: AC
  Administered 2020-09-04: 40 meq via ORAL
  Filled 2020-09-04: qty 2

## 2020-09-04 MED ORDER — LORAZEPAM 0.5 MG PO TABS
0.5000 mg | ORAL_TABLET | Freq: Four times a day (QID) | ORAL | Status: DC | PRN
  Administered 2020-09-04 – 2020-09-05 (×2): 1 mg via ORAL
  Filled 2020-09-04 (×3): qty 2

## 2020-09-04 MED ORDER — CALCIUM GLUCONATE-NACL 1-0.675 GM/50ML-% IV SOLN
1.0000 g | Freq: Once | INTRAVENOUS | Status: AC
  Administered 2020-09-04: 1000 mg via INTRAVENOUS
  Filled 2020-09-04: qty 50

## 2020-09-04 MED ORDER — PHENOBARBITAL SODIUM 65 MG/ML IJ SOLN
65.0000 mg | Freq: Two times a day (BID) | INTRAMUSCULAR | Status: AC
  Administered 2020-09-06 – 2020-09-07 (×2): 65 mg via INTRAVENOUS
  Filled 2020-09-04 (×2): qty 1

## 2020-09-04 MED ORDER — PHENOBARBITAL SODIUM 65 MG/ML IJ SOLN
65.0000 mg | Freq: Three times a day (TID) | INTRAMUSCULAR | Status: AC
  Administered 2020-09-05 – 2020-09-06 (×3): 65 mg via INTRAVENOUS
  Filled 2020-09-04 (×3): qty 1

## 2020-09-04 MED ORDER — PERFLUTREN LIPID MICROSPHERE
1.0000 mL | INTRAVENOUS | Status: AC | PRN
Start: 2020-09-04 — End: 2020-09-04
  Administered 2020-09-04: 2 mL via INTRAVENOUS
  Filled 2020-09-04: qty 10

## 2020-09-04 MED ORDER — POTASSIUM CHLORIDE 10 MEQ/100ML IV SOLN
10.0000 meq | INTRAVENOUS | Status: AC
  Administered 2020-09-04 (×4): 10 meq via INTRAVENOUS
  Filled 2020-09-04 (×4): qty 100

## 2020-09-04 MED ORDER — HYDRALAZINE HCL 10 MG PO TABS
10.0000 mg | ORAL_TABLET | Freq: Two times a day (BID) | ORAL | Status: DC
  Administered 2020-09-04: 10 mg via ORAL
  Filled 2020-09-04: qty 1

## 2020-09-04 NOTE — Progress Notes (Signed)
2D echocardiogram with Definity completed.  09/04/2020 2:44 PM Eula Fried., MHA, RVT, RDCS, RDMS

## 2020-09-04 NOTE — Plan of Care (Signed)
  Problem: Activity: Goal: Risk for activity intolerance will decrease Outcome: Not Progressing   

## 2020-09-04 NOTE — Progress Notes (Addendum)
NAME:  Darryl Mitchell, MRN:  [id number], DOB:  May 03, 1975, LOS: 1 ADMISSION DATE:  09/03/2020, CONSULTATION DATE:  09/03/20 REFERRING MD:  Tegeler, CHIEF COMPLAINT:  chest pain   History of Present Illness:  45 year old man presenting with seizure-like activity and recurrent torsades converted with defib x 7 and mag x 4g.  Had some vague left chest and shoulder pain preceding.  Cardiology to see.  Admits to heavy EtOH use last drink 2 days ago, never had w/d this bad before.  Does have an AICD that also shocked him, this is going to be interrogated.  Visiting from [place] does not know his meds.  Pertinent  Medical History  HTN CHF s/p AICD (he says 25% ef)  Significant Hospital Events: Including procedures, antibiotic start and stop dates in addition to other pertinent events   6/9 admitted requiring multiple shocks boluses of magnesium  Interim History / Subjective:  amiodarone has been turned off all Precedex is now off due to lethargy  Objective   Blood pressure (!) 111/92, pulse 69, temperature 98.7 F (37.1 C), temperature source Oral, resp. rate 16, weight 100.5 kg, SpO2 99 %.        Intake/Output Summary (Last 24 hours) at 09/04/2020 8657 Last data filed at 09/04/2020 0600 Gross per 24 hour  Intake 818.92 ml  Output 650 ml  Net 168.92 ml   Filed Weights   09/03/20 2100 09/03/20 2300 09/04/20 0445  Weight: 108.9 kg 100.4 kg 100.5 kg    Examination: General: Middle-aged male in no acute distress.  Currently on Precedex and somewhat lethargic Precedex will be discontinued HEENT: MM pink/moist.  No JVD or lymphadenopathy is appreciated Neuro: Lethargic but otherwise intact CV: Heart sounds are regular regular rate and rhythm PULM: Decreased breath sounds in the bases  GI: soft, bsx4 active  GU: Voiding with Extremities: warm/dry, 1+ edema ulcer noted on right 2 fingers missing on the right hand.     Skin: Warm and dry   Labs/imaging that I havepersonally reviewed   (right click and "Reselect all SmartList Selections" daily)  Chem k2.9 Cxr iad  Resolved Hospital Problem list   N/a  Assessment & Plan:  Recurrent torsades in patient with hx of heavy alcohol abuse and known CHF with AICD in place.  Defib x 6-7, 4g mag given.  EKG without STEMI.  Continue to treat and monitor potassium Check 2D echo  Continuous cardiac monitoring Cardiology input greatly appreciated currently on Coreg He is not on amiodarone at this time     Severe EtOH w/d  Phenobarbital taper As needed Ativan Precedex currently off Thiamine folic acid Monitor for seizure activity    Baseline cardiomyopathy Need updated med list as able currently takes 4 baby aspirin daily  Best practice (right click and "Reselect all SmartList Selections" daily)  Diet:  NPO Pain/Anxiety/Delirium protocol (if indicated): No VAP protocol (if indicated): Not indicated DVT prophylaxis: Subcutaneous Heparin GI prophylaxis: N/A Glucose control:  SSI Yes Central venous access:  N/A Arterial line:  N/A Foley:  N/A Mobility:  bed rest  PT consulted: N/A Last date of multidisciplinary goals of care discussion [pending] Code Status:  full code Disposition: ICU  Critical care time: 30 min   Brett Canales Minor ACNP Acute Care Nurse Practitioner [facility] Please consult Amion 09/04/2020, 8:15 AM  --------------------------------------------  Attending note: I have seen and examined the patient. History, labs and imaging reviewed.  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  He was defibrillated, given amiodarone, Mg and admitted to ICU  Blood pressure (!) 111/92, pulse 69, temperature 98.7 F (37.1 C), temperature source Oral, resp. rate 16, weight 100.5 kg, SpO2 99 %. Gen:      No acute distress HEENT:  EOMI, sclera anicteric Neck:     No  masses; no thyromegaly Lungs:    Clear to auscultation bilaterally; normal respiratory effort CV:         Regular rate and rhythm; no murmurs Abd:      + bowel sounds; soft, non-tender; no palpable masses, no distension Ext:    No edema; adequate peripheral perfusion Skin:      Warm and dry; no rash Neuro: Somnolent, arousable  Labs/Imaging personally reviewed, significant for Potassium 2.9, BUN/creatinine 10/1.14, mag 2.3 Troponin elevated at 135  Assessment/plan: Recurrent torsades in the setting of cardiomyopathy, alcohol withdrawal Follow 2D echo, telemetry monitoring Replete electrolytes Off amiodarone  Severe EtOH abuse Monitor for withdrawal Phenobarb taper, CIWA protocol Precedex has been turned off Thiamine, folate  The patient is critically ill with multiple organ systems failure and requires high complexity decision making for assessment and support, frequent evaluation and titration of therapies, application of advanced monitoring technologies and extensive interpretation of multiple databases.  Critical care time - 35 mins. This represents my time independent of the NPs time taking care of the pt.  Chilton Greathouse MD [facility] 09/04/2020, 8:18 AM

## 2020-09-04 NOTE — Progress Notes (Signed)
eLink Physician-Brief Progress Note Patient Name: Darryl Mitchell DOB: January 13, 1976 MRN: [id number]   Date of Service  09/04/2020  HPI/Events of Note  Troponin 134 likely secondary to Torsades and shocks, K+ 2.9.  eICU Interventions  Will trend Troponin, and will order 80 meq of additional K+ replacement.        Thomasene Lot Kagan Hietpas 09/04/2020, 4:07 AM

## 2020-09-04 NOTE — Consult Note (Addendum)
Cardiology Consultation:   Patient ID: Darryl Mitchell MRN: [id number]; DOB: 05/28/1975  Admit date: 09/03/2020 Date of Consult: 09/04/2020  PCP:  Oneita Hurt, No   [facility] Providers Cardiologist:  Dr. Latanya Maudlin [facility])   Patient Profile:   Darryl Mitchell is a 45 y.o. male with a hx (by chart) of NICM w/s-ICD, HTN who is being seen 09/04/2020 for the evaluation of VT/VFat the request of Dr. Julianne Mitchell (cardiology service).  History of Present Illness:   Mr. Golubski was brought in via EMS with observed "seizure-like activity) associated with episode of brief and sustained VT/VF episode by chart description with both external and ICD defibrillations. Was reported that he recently relapsed with ETOH abuse and 2 days prior to coming in stopped again and is suspect to be in severe ETOH withdrawal syndrome.  Initially treated with amiodarone (and mag) though stopped by cardiology service mentioning Torsades/PMVT and prolonged QT and suspect electrolyte abnormality and ETOH withdrawal as trigger and treating these should suppress hs arrhythmia  LABS K+ 3.1 > 3.3 > 2.9 Mag 2.5 > 2.3 BUN/Creat 10/1.14 HS Trop 84 > 164 > 105 WBC 5.7 H/H 15/45 Plts 206 TSH 6.782   No device information is available I have reached out to industry to interrogate his device  The patient reports hx of CHF (unknown specifics) and think his device was put in 2016 or 2018 with one prior shock years ago. He denies any other medical HX Confirms recent exacerbation of ETOH abuse and stopped a few days ago Yesterday he started having dizzy spells and then fainting, he recalls at home one shock, his Mom called 911 He does not recall any particular symptoms last few days like CP, but says he isn't sure either.  He is "taking a break" from his home life here for a while, lives in [facility] and sees cardiologist Dr. Latanya Maudlin. He takes his medicines on/off doesn't know the names, but reports was taking them the last few  days   Past Medical History:  Diagnosis Date   CHF (congestive heart failure) (HCC)    Hypertension    Home Medications:  Prior to Admission medications   Medication Sig Start Date End Date Taking? Authorizing Provider  carvedilol (COREG) 25 MG tablet Take 25 mg by mouth 2 (two) times daily. 08/06/20  Yes [provider]  furosemide (LASIX) 80 MG tablet Take 80 mg by mouth 2 (two) times daily. 08/25/20  Yes [provider]  gabapentin (NEURONTIN) 300 MG capsule Take 30 mg by mouth 3 (three) times daily. 08/25/20  Yes [provider]  hydrALAZINE (APRESOLINE) 10 MG tablet Take 10 mg by mouth 2 (two) times daily. 08/06/20  Yes [provider]  hydrOXYzine (VISTARIL) 50 MG capsule Take 50 mg by mouth every 6 (six) hours as needed for anxiety. 08/06/20  Yes [provider]  levothyroxine (SYNTHROID) 25 MCG tablet Take 25 mcg by mouth daily. 08/25/20  Yes [provider]  mirtazapine (REMERON) 30 MG tablet Take 30 mg by mouth at bedtime as needed (sleep). 08/25/20  Yes [provider]  nicotine (NICODERM CQ - DOSED IN MG/24 HOURS) 21 mg/24hr patch Place 21 mg onto the skin daily. 08/06/20  Yes [provider]  spironolactone (ALDACTONE) 25 MG tablet Take 25 mg by mouth daily. 08/25/20  Yes [provider]  traZODone (DESYREL) 50 MG tablet Take 50 mg by mouth at bedtime as needed for sleep. 08/06/20  Yes [provider]    Inpatient Medications:  Scheduled Meds:  aspirin EC  81 mg Oral Daily   carvedilol  12.5 mg Oral BID WC   Chlorhexidine Gluconate Cloth  6 each Topical Q0600   folic acid  1 mg Oral Daily   heparin  5,000 Units Subcutaneous Q8H   insulin aspart  0-15 Units Subcutaneous Q4H   multivitamin with minerals  1 tablet Oral Daily   PHENObarbital  130 mg Intravenous Q8H   Followed by   [START ON 09/05/2020] PHENObarbital  65 mg Intravenous Q8H   Followed by   [START ON 09/06/2020] PHENObarbital  65 mg  Intravenous BID   thiamine  100 mg Oral Daily   Continuous Infusions:  dexmedetomidine (PRECEDEX) IV infusion 0.3 mcg/kg/hr (09/04/20 0600)   potassium chloride 10 mEq (09/04/20 0835)   PRN Meds: docusate sodium, midazolam, polyethylene glycol  Allergies:    Allergies  Allergen Reactions   Lisinopril Swelling    Social History:   Social History   Socioeconomic History   Marital status: Divorced    Spouse name: Not on file   Number of children: Not on file   Years of education: Not on file   Highest education level: Not on file  Occupational History   Not on file  Tobacco Use   Smoking status: Not on file   Smokeless tobacco: Not on file  Substance and Sexual Activity   Alcohol use: Not on file   Drug use: Not on file   Sexual activity: Not on file  Other Topics Concern   Not on file  Social History Narrative   Not on file   Social Determinants of Health   Financial Resource Strain: Not on file  Food Insecurity: Not on file  Transportation Needs: Not on file  Physical Activity: Not on file  Stress: Not on file  Social Connections: Not on file  Intimate Partner Violence: Not on file    Family History:   He doesn't know specific family hx  ROS:  Please see the history of present illness.  All other ROS reviewed and negative.     Physical Exam/Data:   Vitals:   09/04/20 0630 09/04/20 0645 09/04/20 0700 09/04/20 0833  BP: 109/87 (!) 113/94 (!) 111/92 (!) 131/108  Pulse: 69 69 69 74  Resp: 17 16 16    Temp:      TempSrc:      SpO2: 99% 99% 99%   Weight:        Intake/Output Summary (Last 24 hours) at 09/04/2020 0846 Last data filed at 09/04/2020 0600 Gross per 24 hour  Intake 818.92 ml  Output 650 ml  Net 168.92 ml   Last 3 Weights 09/04/2020 09/03/2020 09/03/2020  Weight (lbs) 221 lb 9 oz 221 lb 5.5 oz 240 lb  Weight (kg) 100.5 kg 100.4 kg 108.863 kg     There is no height or weight on file to calculate BMI.  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 Skin: has a black wound bottom of R great toe (he reports about 2 days) Neuro:  CNs 2-12 intact, no focal abnormalities noted Psych:  Normal affect   EKG:  The EKG was personally reviewed and  demonstrates:    SR 97bpm, LAD, poor R progression, no acute/ischemic looking changes  Telemetry:  Telemetry was personally reviewed and demonstrates:    SR 60's-70's  EMS strips are reviewed Agree with Torsades   Relevant CV Studies:  No historical cardiac data available  Laboratory Data:  High Sensitivity Troponin:   Recent Labs  Lab 09/03/20 2011 09/04/20 0251 09/04/20 0535  TROPONINIHS 84* 134* 135*     Chemistry Recent Labs  Lab 09/03/20 2011 09/03/20 2036 09/04/20 0251  NA 138 135 139  K 3.1* 3.3* 2.9*  CL 83* 84* 88*  CO2 29  --  39*  GLUCOSE 161* 164* 105*  BUN 13 17 10   CREATININE 1.65* 1.60* 1.14  CALCIUM 7.2*  --  7.1*  GFRNONAA 52*  --  >60  ANIONGAP 26*  --  12    Recent Labs  Lab 09/03/20 2011 09/04/20 0251  PROT 6.7 6.1*  ALBUMIN 3.5 3.2*  AST 98* 82*  ALT 40 37  ALKPHOS 215* 198*  BILITOT 1.6* 1.2   Hematology Recent Labs  Lab 09/03/20 2011 09/03/20 2036  WBC 5.7  --   RBC 4.80  --   HGB 13.6 15.3  HCT 39.9 45.0  MCV 83.1  --   MCH 28.3  --   MCHC 34.1  --   RDW 16.3*  --   PLT 206  --    BNPNo results for input(s): BNP, PROBNP in the last 168 hours.  DDimer No results for input(s): DDIMER in the last 168 hours.   Radiology/Studies:  DG Chest 2 View  Result Date: 09/04/2020 CLINICAL DATA:  Initial evaluation for ICD. EXAM: CHEST - 2 VIEW COMPARISON:  Prior radiograph from 09/03/2020. FINDINGS:  Left-sided subcutaneous ICD in place. The tip of the electrode terminates adjacent to the aortic knob, stable from previous. Defibrillator pads overlie the right chest. Underlying moderate cardiomegaly. Lungs mildly hypoinflated. Mild diffuse pulmonary venous and interstitial congestion. No definite pleural effusion. No focal infiltrates. No pneumothorax. Osseous structures unchanged. IMPRESSION: 1. Left-sided subcutaneous ICD in place with tip of the electrode terminating adjacent to the aortic knob. 2. Cardiomegaly with mild diffuse pulmonary venous congestion. Electronically Signed   By: Rise Mu M.D.   On: 09/04/2020 02:41   DG Chest Portable 1 View  Result Date: 09/03/2020 CLINICAL DATA:  Chest pain EXAM: PORTABLE CHEST 1 VIEW COMPARISON:  None. FINDINGS: Lungs are clear. No pneumothorax or pleural effusion. Implanted epicardial defibrillator noted. Cardiac size is mildly enlarged. Pulmonary vascularity is normal. No acute bone abnormality. IMPRESSION: Mild cardiomegaly. Electronically Signed   By: Helyn Numbers MD   On: 09/03/2020 21:02     Assessment and Plan:   Severe ETOH withdrawal  Severe hypokalemia       C/w CCM  3. Torsades 4. Reported hx of NICM 5. CHF by CXR      Agree, most likely triggered by withdrawal and marked electrolyte abnormality      Mild HS Trop abnormality, no acute looking ischemic changes, and most likely 2/2 arrhythmia and shocks though will defer to attending cardiology team decisions on any ischemic w/u       Echo is pending  Continue coreg  Dr. Ladona Ridgel will see later today Will defer primary management to CCM and attending cardiology team I have asked for device interrogation is pending   Risk Assessment/Risk Scores:  { For questions or updates, please contact [facility] Please consult [email or web address] for contact info under    Signed,  Sheilah Pigeon, PA-C  09/04/2020 8:46 AM  EP Attending  Patient seen and examined. Agree with  the findings as noted above. He admits to an ETOH binge and presented with hypokalemia, marked QT prlongation, particularly on repeat ECG where the T waves are less flat, and interrogation of his SICD demonstrates VFwith successful ICD therapy. I am not sure that his is really torsades as I did not see salvos. He has undergone potassium repletion but the QT is still long and K is still low. I suspect a significant and concomittant magnesium deficiency. For now continue the beta blocker. Main issue is to stop drinking ETOH. Watch for DT's. He has had them remotely.   Sharlot Gowda Danaja [place]ota,MD

## 2020-09-04 NOTE — Progress Notes (Addendum)
Progress Note  Patient Name: Darryl Mitchell Date of Encounter: 09/04/2020  [facility] Cardiologist: New   Subjective   Patient states he is feeling very fatigued. He can't tell me how he feels at this moment. He denied chest pain. He does not know if he is SOB. He denied leg swelling. He takes his home meds intermittently, knows he is on Coreg and Lasix. He states he is not DM. He drinks 1 gallon liquor daily. He denied any hallucination at this time. He states he had a cardiac cath many years ago and it was " good"   Inpatient Medications    Scheduled Meds:  aspirin EC  81 mg Oral Daily   carvedilol  12.5 mg Oral BID WC   Chlorhexidine Gluconate Cloth  6 each Topical Q0600   folic acid  1 mg Oral Daily   heparin  5,000 Units Subcutaneous Q8H   insulin aspart  0-15 Units Subcutaneous Q4H   multivitamin with minerals  1 tablet Oral Daily   PHENObarbital  130 mg Intravenous Q8H   Followed by   [START ON 09/05/2020] PHENObarbital  65 mg Intravenous Q8H   Followed by   Melene Muller ON 09/06/2020] PHENObarbital  65 mg Intravenous BID   thiamine  100 mg Oral Daily   Continuous Infusions:  dexmedetomidine (PRECEDEX) IV infusion 0.3 mcg/kg/hr (09/04/20 0600)   PRN Meds: docusate sodium, LORazepam, polyethylene glycol   Vital Signs    Vitals:   09/04/20 0630 09/04/20 0645 09/04/20 0700 09/04/20 0833  BP: 109/87 (!) 113/94 (!) 111/92 (!) 131/108  Pulse: 69 69 69 74  Resp: 17 16 16    Temp:   98.1 F (36.7 C)   TempSrc:   Oral   SpO2: 99% 99% 99%   Weight:        Intake/Output Summary (Last 24 hours) at 09/04/2020 1132 Last data filed at 09/04/2020 0600 Gross per 24 hour  Intake 818.92 ml  Output 650 ml  Net 168.92 ml   Last 3 Weights 09/04/2020 09/03/2020 09/03/2020  Weight (lbs) 221 lb 9 oz 221 lb 5.5 oz 240 lb  Weight (kg) 100.5 kg 100.4 kg 108.863 kg      Telemetry    Sinus rhythm 70s, artifacts- Personally Reviewed  ECG    No new tracing today - Personally  Reviewed  Physical Exam   GEN: No acute distress. Fatigued   Neck: No JVD Cardiac: RRR, no murmurs, rubs, or gallops. On 2LNC oxygen.  Respiratory: Clear to auscultation bilaterally. Speaks short sentence. No DOE.  GI: Soft, nontender, non-distended  MS: No BLE edema; No deformity. Neuro:  Nonfocal  Psych: Normal affect   Labs    High Sensitivity Troponin:   Recent Labs  Lab 09/03/20 2011 09/04/20 0251 09/04/20 0535  TROPONINIHS 84* 134* 135*      Chemistry Recent Labs  Lab 09/03/20 2011 09/03/20 2036 09/04/20 0251 09/04/20 0949  NA 138 135 139 136  K 3.1* 3.3* 2.9* 3.2*  CL 83* 84* 88* 88*  CO2 29  --  39* 34*  GLUCOSE 161* 164* 105* 113*  BUN 13 17 10 11   CREATININE 1.65* 1.60* 1.14 1.19  CALCIUM 7.2*  --  7.1* 6.9*  PROT 6.7  --  6.1*  --   ALBUMIN 3.5  --  3.2*  --   AST 98*  --  82*  --   ALT 40  --  37  --   ALKPHOS 215*  --  198*  --  BILITOT 1.6*  --  1.2  --   GFRNONAA 52*  --  >60 >60  ANIONGAP 26*  --  12 14     Hematology Recent Labs  Lab 09/03/20 2011 09/03/20 2036  WBC 5.7  --   RBC 4.80  --   HGB 13.6 15.3  HCT 39.9 45.0  MCV 83.1  --   MCH 28.3  --   MCHC 34.1  --   RDW 16.3*  --   PLT 206  --     BNPNo results for input(s): BNP, PROBNP in the last 168 hours.   DDimer No results for input(s): DDIMER in the last 168 hours.   Radiology    DG Chest 2 View  Result Date: 09/04/2020 CLINICAL DATA:  Initial evaluation for ICD. EXAM: CHEST - 2 VIEW COMPARISON:  Prior radiograph from 09/03/2020. FINDINGS: Left-sided subcutaneous ICD in place. The tip of the electrode terminates adjacent to the aortic knob, stable from previous. Defibrillator pads overlie the right chest. Underlying moderate cardiomegaly. Lungs mildly hypoinflated. Mild diffuse pulmonary venous and interstitial congestion. No definite pleural effusion. No focal infiltrates. No pneumothorax. Osseous structures unchanged. IMPRESSION: 1. Left-sided subcutaneous ICD in place  with tip of the electrode terminating adjacent to the aortic knob. 2. Cardiomegaly with mild diffuse pulmonary venous congestion. Electronically Signed   By: Rise Mu M.D.   On: 09/04/2020 02:41   DG Chest Portable 1 View  Result Date: 09/03/2020 CLINICAL DATA:  Chest pain EXAM: PORTABLE CHEST 1 VIEW COMPARISON:  None. FINDINGS: Lungs are clear. No pneumothorax or pleural effusion. Implanted epicardial defibrillator noted. Cardiac size is mildly enlarged. Pulmonary vascularity is normal. No acute bone abnormality. IMPRESSION: Mild cardiomegaly. Electronically Signed   By: Helyn Numbers MD   On: 09/03/2020 21:02    Cardiac Studies   N.A   Patient Profile     45 y.o. male with PMH significant of chronic systolic and diastolic heart failure, nonischemic cardiomyopathy s/p BS ICD, alcohol abuse, hypertension, anxiety, depression, hypothyroidism, medication noncompliance, type 2 diabetes, OSA untreated, obesity, who presented with torsades and severe alcohol withdrawal.  He reports numerous ICD shocks in the past 24 hours.  He has longstanding history of alcohol abuse, sober  for 2 years, relapsed 2 months ago, decided to detox through cold [place] 2 days ago with subsequent severe withdrawal.  He is a limited historian with limited insight and knowledge of his medical condition.   Assessment & Plan   Torsades/PMVT Chronic systolic and diastolic heart failure Nonischemic cardiomyopathy History of BS ICD implant -Presented with numerous ICD shocks in 24 hours in the setting of alcohol withdrawal, electrolytes derangement -device interrogation at ED confirmed 18 episodes in 24 hours and he received therapy 20 times per initial consult note  -Per outside records ([facility]) 08/20/20, he has history of NICM presumed due to alcohol abuse, EF 25 to 30%, history of noncompliance, recurrent hospitalization for decompensated heart failure and anasarca, homeless with limited  access to care -Home cardiac meds from outside records appears include aspirin 81, atorvastatin 40mg  daily, carvedilol 25 twice daily, hydralazine 10 mg twice daily isosorbide 30 mg daily, nifedipine 60 mg BID, spironolactone 12.5 mg daily -Echocardiogram is pending today -Continue aggressive correction of electrolytes derangement -Resumed GDMT with carvedilol 12.5mg  BID,  may uptitrate and add hydralazine+ isosorbide, spironolactone if blood pressure tolerating, hold diuretic as he is euvolemic at this time  -Appreciate EP consult, felt torsades triggered by alcohol withdrawal and marked electrolytes  abnormality, will follow-up on device interrogation  Elevated troponin -Sensitive troponin 84 >134 >135 -He denied chest pain -Echocardiogram is pending -Per outside cardiology records, nonischemic cardiomyopathy, negative stress in August 2016 when EF was found to be at 15-30% - He self reports previous cardiac cath was "good" no CAD - Low suspicion for ACS at this time  Hypertension -Blood pressure is low normal currently, continue carvedilol, add back hydralazine, isosorbide, spironolactone when BP is stable and less sedation  meds required  Hypokalemia Acute kidney injury Transaminitis Alcohol withdrawal Type 2 DM OSA  -K and renal index improving, currently requiring phenobarbital and precedx infusion, A1C pending, on [place] oxygen, managed per primary team       For questions or updates, please contact [facility] Please consult [email or web address] for contact info under        Signed, Cyndi Bender, NP  09/04/2020, 11:32 AM

## 2020-09-04 NOTE — Progress Notes (Signed)
Progress Note  Patient Name: Darryl Mitchell Date of Encounter: 09/05/2020  [facility] Cardiologist: None   Subjective   No further episodes of VT overnight. No chest pain currently.  HR 80-90s BP 100-130s Cr stable at 1.18  Inpatient Medications    Scheduled Meds:  aspirin EC  81 mg Oral Daily   carvedilol  12.5 mg Oral BID WC   Chlorhexidine Gluconate Cloth  6 each Topical Q0600   folic acid  1 mg Oral Daily   heparin  5,000 Units Subcutaneous Q8H   hydrALAZINE  10 mg Oral BID   insulin aspart  0-15 Units Subcutaneous Q4H   levothyroxine  25 mcg Oral Q0600   multivitamin with minerals  1 tablet Oral Daily   PHENObarbital  130 mg Intravenous Q8H   Followed by   PHENObarbital  65 mg Intravenous Q8H   Followed by   [START ON 09/06/2020] PHENObarbital  65 mg Intravenous BID   potassium chloride  40 mEq Oral Q4H   thiamine  100 mg Oral Daily   Continuous Infusions:  calcium gluconate 50 mL/hr at 09/05/20 0900   PRN Meds: clonazePAM, docusate sodium, hydrALAZINE, ibuprofen, LORazepam, polyethylene glycol   Vital Signs    Vitals:   09/05/20 0630 09/05/20 0750 09/05/20 0800 09/05/20 0900  BP: (!) 127/92 122/85 (!) 130/99 103/68  Pulse: 87 90 88 83  Resp: 18  16 20   Temp:   99.5 F (37.5 C)   TempSrc:   Oral   SpO2: 92%  99% 97%  Weight:        Intake/Output Summary (Last 24 hours) at 09/05/2020 0913 Last data filed at 09/05/2020 0900 Gross per 24 hour  Intake 937.01 ml  Output 940 ml  Net -2.99 ml   Last 3 Weights 09/04/2020 09/03/2020 09/03/2020  Weight (lbs) 221 lb 9 oz 221 lb 5.5 oz 240 lb  Weight (kg) 100.5 kg 100.4 kg 108.863 kg      Telemetry    NSR, PVCs, no VT - Personally Reviewed  ECG    NSR with LVH with strain- Personally Reviewed  Physical Exam   GEN: No acute distress.   Neck: JVD to the angle of the mandible Cardiac: RRR, no murmurs, rubs, or gallops.  Respiratory: Rhonchorous but partially clears with coughing GI: Soft, nontender,  non-distended  MS: Warm, trace edema Neuro:  Nonfocal  Psych: Normal affect   Labs    High Sensitivity Troponin:   Recent Labs  Lab 09/03/20 2011 09/04/20 0251 09/04/20 0535 09/04/20 1510  TROPONINIHS 84* 134* 135* 74*      Chemistry Recent Labs  Lab 09/03/20 2011 09/03/20 2036 09/04/20 0251 09/04/20 0949 09/05/20 0158  NA 138   < > 139 136 138  K 3.1*   < > 2.9* 3.2* 3.0*  CL 83*   < > 88* 88* 90*  CO2 29  --  39* 34* 35*  GLUCOSE 161*   < > 105* 113* 96  BUN 13   < > 10 11 11   CREATININE 1.65*   < > 1.14 1.19 1.18  CALCIUM 7.2*  --  7.1* 6.9* 7.1*  PROT 6.7  --  6.1*  --  6.0*  ALBUMIN 3.5  --  3.2*  --  3.0*  AST 98*  --  82*  --  95*  ALT 40  --  37  --  37  ALKPHOS 215*  --  198*  --  194*  BILITOT 1.6*  --  1.2  --  1.6*  GFRNONAA 52*  --  >60 >60 >60  ANIONGAP 26*  --  12 14 13    < > = values in this interval not displayed.     Hematology Recent Labs  Lab 09/03/20 2011 09/03/20 2036  WBC 5.7  --   RBC 4.80  --   HGB 13.6 15.3  HCT 39.9 45.0  MCV 83.1  --   MCH 28.3  --   MCHC 34.1  --   RDW 16.3*  --   PLT 206  --     BNPNo results for input(s): BNP, PROBNP in the last 168 hours.   DDimer No results for input(s): DDIMER in the last 168 hours.   Radiology    DG Chest 2 View  Result Date: 09/04/2020 CLINICAL DATA:  Initial evaluation for ICD. EXAM: CHEST - 2 VIEW COMPARISON:  Prior radiograph from 09/03/2020. FINDINGS: Left-sided subcutaneous ICD in place. The tip of the electrode terminates adjacent to the aortic knob, stable from previous. Defibrillator pads overlie the right chest. Underlying moderate cardiomegaly. Lungs mildly hypoinflated. Mild diffuse pulmonary venous and interstitial congestion. No definite pleural effusion. No focal infiltrates. No pneumothorax. Osseous structures unchanged. IMPRESSION: 1. Left-sided subcutaneous ICD in place with tip of the electrode terminating adjacent to the aortic knob. 2. Cardiomegaly with mild  diffuse pulmonary venous congestion. Electronically Signed   By: 11/03/2020 M.D.   On: 09/04/2020 02:41   DG Chest Port 1 View  Result Date: 09/05/2020 CLINICAL DATA:  Abnormal respiration.  Ventricular tachycardia. EXAM: PORTABLE CHEST 1 VIEW COMPARISON:  One day prior FINDINGS: Again identified are external pacer/defibrillator. Midline trachea. Moderate cardiomegaly. No pleural effusion or pneumothorax. No lobar consolidation. Pulmonary venous congestion is not significantly changed. Patchy areas of bibasilar atelectasis remain. IMPRESSION: Cardiomegaly with similar pulmonary venous congestion. Electronically Signed   By: 11/05/2020 M.D.   On: 09/05/2020 08:00   DG CHEST PORT 1 VIEW  Result Date: 09/04/2020 CLINICAL DATA:  Cardiac arrest. EXAM: PORTABLE CHEST 1 VIEW COMPARISON:  Earlier film, same date. FINDINGS: The external pacer paddle and wire/defibrillator are stable. The heart is enlarged but stable. The mediastinal and hilar contours are within normal limits. Patchy bibasilar atelectasis and mild vascular congestion but no overt pulmonary edema or definite pleural effusions. No pneumothorax. IMPRESSION: 1. Stable cardiac enlargement. 2. Patchy bibasilar atelectasis and mild vascular congestion but no overt pulmonary edema. Electronically Signed   By: 11/04/2020 M.D.   On: 09/04/2020 15:01   DG Chest Portable 1 View  Result Date: 09/03/2020 CLINICAL DATA:  Chest pain EXAM: PORTABLE CHEST 1 VIEW COMPARISON:  None. FINDINGS: Lungs are clear. No pneumothorax or pleural effusion. Implanted epicardial defibrillator noted. Cardiac size is mildly enlarged. Pulmonary vascularity is normal. No acute bone abnormality. IMPRESSION: Mild cardiomegaly. Electronically Signed   By: 11/03/2020 MD   On: 09/03/2020 21:02   ECHOCARDIOGRAM COMPLETE  Result Date: 09/04/2020    ECHOCARDIOGRAM REPORT   Patient Name:   Darryl Mitchell  Date of Exam: 09/04/2020 Medical Rec #:  11/04/2020  Height: Accession  #:    [id number] Weight:       221.6 lb Date of Birth:  1975/05/11 BSA:          2.162 m Patient Age:    45 years   BP:           124/98 mmHg Patient Gender: M          HR:  77 bpm. Exam Location:  Inpatient Procedure: 2D Echo, Cardiac Doppler, Color Doppler and Intracardiac            Opacification Agent Indications:    Dilated cardiomyopathy  History:        Patient has no prior history of Echocardiogram examinations.                 Risk Factors:Hypertension.  Sonographer:    Gertie Fey MHA, RDMS, RVT, RDCS Referring Phys: [id number] Lorin Glass IMPRESSIONS  1. Left ventricular ejection fraction, by estimation, is 10%. The left ventricle has normal function. The left ventricle demonstrates global hypokinesis. The left ventricular internal cavity size was severely dilated. Left ventricular diastolic parameters are consistent with Grade III diastolic dysfunction (restrictive). Elevated left ventricular end-diastolic pressure.  2. Right ventricular systolic function is severely reduced. The right ventricular size is moderately enlarged.  3. Left atrial size was mildly dilated.  4. The mitral valve is normal in structure. Mild mitral valve regurgitation. No evidence of mitral stenosis.  5. The aortic valve is normal in structure. Aortic valve regurgitation is not visualized. No aortic stenosis is present.  6. Aortic dilatation noted. There is mild dilatation of the aortic root, measuring 39 mm.  7. The inferior vena cava is normal in size with greater than 50% respiratory variability, suggesting right atrial pressure of 3 mmHg. FINDINGS  Left Ventricle: Left ventricular ejection fraction, by estimation, is 10%. The left ventricle has normal function. The left ventricle demonstrates global hypokinesis. Definity contrast agent was given IV to delineate the left ventricular endocardial borders. The left ventricular internal cavity size was severely dilated. There is no left ventricular hypertrophy.  Left ventricular diastolic parameters are consistent with Grade III diastolic dysfunction (restrictive). Elevated left ventricular end-diastolic pressure. Right Ventricle: The right ventricular size is moderately enlarged. No increase in right ventricular wall thickness. Right ventricular systolic function is severely reduced. Left Atrium: Left atrial size was mildly dilated. Right Atrium: Right atrial size was normal in size. Pericardium: There is no evidence of pericardial effusion. Mitral Valve: The mitral valve is normal in structure. Mild mitral valve regurgitation. No evidence of mitral valve stenosis. Tricuspid Valve: The tricuspid valve is normal in structure. Tricuspid valve regurgitation is mild . No evidence of tricuspid stenosis. Aortic Valve: The aortic valve is normal in structure. Aortic valve regurgitation is not visualized. No aortic stenosis is present. Aortic valve mean gradient measures 1.0 mmHg. Aortic valve peak gradient measures 2.2 mmHg. Aortic valve area, by VTI measures 3.87 cm. Pulmonic Valve: The pulmonic valve was normal in structure. Pulmonic valve regurgitation is mild. No evidence of pulmonic stenosis. Aorta: Aortic dilatation noted. There is mild dilatation of the aortic root, measuring 39 mm. Venous: The inferior vena cava is normal in size with greater than 50% respiratory variability, suggesting right atrial pressure of 3 mmHg. IAS/Shunts: No atrial level shunt detected by color flow Doppler.  LEFT VENTRICLE PLAX 2D LVIDd:         7.00 cm      Diastology LVIDs:         6.60 cm      LV e' medial:    2.48 cm/s LV PW:         0.70 cm      LV E/e' medial:  36.1 LV IVS:        0.50 cm      LV e' lateral:   4.00 cm/s LVOT diam:     2.90 cm  LV E/e' lateral: 22.4 LV SV:         48 LV SV Index:   22 LVOT Area:     6.61 cm  LV Volumes (MOD) LV vol d, MOD A2C: 168.0 ml LV vol d, MOD A4C: 169.0 ml LV vol s, MOD A2C: 158.0 ml LV vol s, MOD A4C: 166.0 ml LV SV MOD A2C:     10.0 ml LV SV  MOD A4C:     169.0 ml LV SV MOD BP:      5.4 ml RIGHT VENTRICLE RV S prime:     7.88 cm/s TAPSE (M-mode): 1.3 cm LEFT ATRIUM              Index       RIGHT ATRIUM           Index LA diam:        4.50 cm  2.08 cm/m  RA Area:     21.10 cm LA Vol (A2C):   104.0 ml 48.10 ml/m RA Volume:   64.40 ml  29.79 ml/m LA Vol (A4C):   56.1 ml  25.95 ml/m LA Biplane Vol: 78.2 ml  36.17 ml/m  AORTIC VALVE AV Area (Vmax):    4.51 cm AV Area (Vmean):   4.49 cm AV Area (VTI):     3.87 cm AV Vmax:           74.50 cm/s AV Vmean:          53.200 cm/s AV VTI:            0.124 m AV Peak Grad:      2.2 mmHg AV Mean Grad:      1.0 mmHg LVOT Vmax:         50.90 cm/s LVOT Vmean:        36.200 cm/s LVOT VTI:          0.073 m LVOT/AV VTI ratio: 0.59  AORTA Ao Root diam: 3.30 cm MITRAL VALVE               TRICUSPID VALVE MV Area (PHT): 3.77 cm    TR Peak grad:   16.5 mmHg MV Decel Time: 201 msec    TR Vmax:        203.00 cm/s MR Peak grad: 53.3 mmHg MR Mean grad: 42.0 mmHg    SHUNTS MR Vmax:      365.00 cm/s  Systemic VTI:  0.07 m MR Vmean:     306.0 cm/s   Systemic Diam: 2.90 cm MV E velocity: 89.60 cm/s MV A velocity: 32.30 cm/s MV E/A ratio:  2.77 Armanda Magicraci Turner MD Electronically signed by Armanda Magicraci Turner MD Signature Date/Time: 09/04/2020/5:51:35 PM    Final     Cardiac Studies  TTE 09/04/20: IMPRESSIONS   1. Left ventricular ejection fraction, by estimation, is 10%. The left  ventricle has normal function. The left ventricle demonstrates global  hypokinesis. The left ventricular internal cavity size was severely  dilated. Left ventricular diastolic  parameters are consistent with Grade III diastolic dysfunction  (restrictive). Elevated left ventricular end-diastolic pressure.   2. Right ventricular systolic function is severely reduced. The right  ventricular size is moderately enlarged.   3. Left atrial size was mildly dilated.   4. The mitral valve is normal in structure. Mild mitral valve  regurgitation. No evidence of  mitral stenosis.   5. The aortic valve is normal in structure. Aortic valve regurgitation is  not visualized. No aortic stenosis is present.  6. Aortic dilatation noted. There is mild dilatation of the aortic root,  measuring 39 mm.   7. The inferior vena cava is normal in size with greater than 50%  respiratory variability, suggesting right atrial pressure of 3 mmHg.   Patient Profile     45 y.o. male with PMH significant of chronic systolic and diastolic heart failure, nonischemic cardiomyopathy s/p BS ICD, alcohol abuse, hypertension, anxiety, depression, hypothyroidism, medication noncompliance, type 2 diabetes, OSA untreated, obesity, who presented with torsades and severe alcohol withdrawal.  He reports numerous ICD shocks in the past 24 hours.  He has longstanding history of alcohol abuse, sober  for 2 years, relapsed 2 months ago, decided to detox through cold [place] 2 days ago with subsequent severe withdrawal.  He is a limited historian with limited insight and knowledge of his medical condition.  Assessment & Plan    #Torsades/PMVT #Chronic systolic and diastolic heart failure #Nonischemic cardiomyopathy s/p ICD Implant #Severe RV Dysfunction Patient presented with numerous ICD shocks in 24 hours in the setting of alcohol withdrawal, hypokalemia and hypomagnesia. Device interrogation at ED confirmed 18 episodes of VT/VF in 24 hours and he received therapy 20 times per initial consult note. Patient has known history of NICM with previous EF 25-30%, now 10% on TTE 09/04/20 with G3DD with history of noncompliance, recurrent hospitalization for decompensated heart failure and anasarca, homeless with limited access to care. Was seen by EP who thought VT likely triggered by alcohol abuse and electrolyte disturbances.  -Continue coreg 12.5mg  BID; given very low CO, will not up-titrate at this time -Change hydralazine to 10mg  TID instead of BID dosing -Start low dose imdur 15mg   daily -Pending BP, will start spiro -While volume up on exam, will hold lasix today and likely start tomorrow once K repleted -Will add GDMT as tolerated--has been noncompliant in the past so will need to be judicious for ACE/ARB/ARNI -Will need to establish care with HF team as out-patient -Aggressive electrolyte repletion with goal K>4, Mg>2   #Elevated troponin Likely secondary to VT/ICD shocks that occurred in the setting of alcohol abuse and significant electrolyte disturbances with low suspicion of ACS. Stress test negative in 10/2014. No known CAD on reported cath per patient.  - Manage VT/HF as above   #Hypertension -Add back GDMT as able as detailed above  #Aortic Root Dilation: 29mm on TTE. -Needs serial monitoring as out-patient   #Hypokalemia #Acute kidney injury #Transaminitis #Alcohol withdrawal #Type 2 DM #OSA  -Management per primary team      For questions or updates, please contact [facility] Please consult [email or web address] for contact info under        Signed, 11/2014, MD  09/05/2020, 9:13 AM

## 2020-09-04 NOTE — TOC Initial Note (Signed)
Transition of Care [facility]) - Initial/Assessment Note    Patient Details  Name: Gahel Safley MRN: [id number] Date of Birth: [birth year]  [facility]) CM/SW Contact:    Lockie Pares, RN Phone Number: 09/04/2020, 8:37 AM  Clinical Narrative:                 45 YO male patient from [place] has AICD cannot recal all the circumstances as to why he has it. Stopped drinking alcohol 2 days ago, started having his AICD go off. Went into vfib in ED tosades. Was given magnesium. In ICU for electrolyte stabilization and close monitoring. Will need SA education once more stable.   Expected Discharge Plan: Home/Self Care     Patient Goals and CMS Choice        Expected Discharge Plan and Services Expected Discharge Plan: Home/Self Care   Discharge Planning Services: CM Consult                                          Prior Living Arrangements/Services     Patient language and need for interpreter reviewed:: Yes        Need for Family Participation in Patient Care: Yes (Comment) Care giver support system in place?: Yes (comment)   Criminal Activity/Legal Involvement Pertinent to Current Situation/Hospitalization: No - Comment as needed  Activities of Daily Living      Permission Sought/Granted                  Emotional Assessment       Orientation: : Fluctuating Orientation (Suspected and/or reported Sundowners) Alcohol / Substance Use: Alcohol Use Psych Involvement: No (comment)  Admission diagnosis:  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

## 2020-09-05 ENCOUNTER — Inpatient Hospital Stay (HOSPITAL_COMMUNITY): Payer: Medicare Other

## 2020-09-05 DIAGNOSIS — Z95810 Presence of automatic (implantable) cardiac defibrillator: Secondary | ICD-10-CM

## 2020-09-05 DIAGNOSIS — I5023 Acute on chronic systolic (congestive) heart failure: Secondary | ICD-10-CM

## 2020-09-05 DIAGNOSIS — I4901 Ventricular fibrillation: Secondary | ICD-10-CM

## 2020-09-05 LAB — BASIC METABOLIC PANEL
Anion gap: 13 (ref 5–15)
BUN: 11 mg/dL (ref 6–20)
CO2: 35 mmol/L — ABNORMAL HIGH (ref 22–32)
Calcium: 7.1 mg/dL — ABNORMAL LOW (ref 8.9–10.3)
Chloride: 90 mmol/L — ABNORMAL LOW (ref 98–111)
Creatinine, Ser: 1.18 mg/dL (ref 0.61–1.24)
GFR, Estimated: >60 mL/min (ref >60)
Glucose, Bld: 96 mg/dL (ref 70–99)
Potassium: 3 mmol/L — ABNORMAL LOW (ref 3.5–5.1)
Sodium: 138 mmol/L (ref 135–145)

## 2020-09-05 LAB — GLUCOSE, CAPILLARY
Glucose-Capillary: 97 mg/dL (ref 70–99)
Glucose-Capillary: 107 mg/dL — ABNORMAL HIGH (ref 70–99)
Glucose-Capillary: 167 mg/dL — ABNORMAL HIGH (ref 70–99)
Glucose-Capillary: 143 mg/dL — ABNORMAL HIGH (ref 70–99)
Glucose-Capillary: 96 mg/dL (ref 70–99)

## 2020-09-05 LAB — HEPATIC FUNCTION PANEL
ALT: 37 U/L (ref 0–44)
AST: 95 U/L — ABNORMAL HIGH (ref 15–41)
Albumin: 3 g/dL — ABNORMAL LOW (ref 3.5–5.0)
Alkaline Phosphatase: 194 U/L — ABNORMAL HIGH (ref 38–126)
Bilirubin, Direct: 0.6 mg/dL — ABNORMAL HIGH (ref 0.0–0.2)
Indirect Bilirubin: 1 mg/dL — ABNORMAL HIGH (ref 0.3–0.9)
Total Bilirubin: 1.6 mg/dL — ABNORMAL HIGH (ref 0.3–1.2)
Total Protein: 6 g/dL — ABNORMAL LOW (ref 6.5–8.1)

## 2020-09-05 LAB — MAGNESIUM: Magnesium: 1.9 mg/dL (ref 1.7–2.4)

## 2020-09-05 LAB — PHOSPHORUS: Phosphorus: 3.5 mg/dL (ref 2.5–4.6)

## 2020-09-05 MED ORDER — POTASSIUM CHLORIDE 10 MEQ/100ML IV SOLN
10.0000 meq | INTRAVENOUS | Status: DC
  Administered 2020-09-05: 10 meq via INTRAVENOUS
  Filled 2020-09-05: qty 100

## 2020-09-05 MED ORDER — HYDRALAZINE HCL 10 MG PO TABS
10.0000 mg | ORAL_TABLET | Freq: Three times a day (TID) | ORAL | Status: DC
  Administered 2020-09-05 – 2020-09-07 (×6): 10 mg via ORAL
  Filled 2020-09-05 (×7): qty 1

## 2020-09-05 MED ORDER — ISOSORBIDE MONONITRATE ER 30 MG PO TB24
15.0000 mg | ORAL_TABLET | Freq: Every day | ORAL | Status: DC
  Administered 2020-09-05 – 2020-09-07 (×3): 15 mg via ORAL
  Filled 2020-09-05 (×3): qty 1

## 2020-09-05 MED ORDER — POTASSIUM CHLORIDE CRYS ER 20 MEQ PO TBCR
20.0000 meq | EXTENDED_RELEASE_TABLET | ORAL | Status: DC
Start: 2020-09-05 — End: 2020-09-05
  Administered 2020-09-05: 20 meq via ORAL
  Filled 2020-09-05: qty 1

## 2020-09-05 MED ORDER — LORAZEPAM 2 MG/ML IJ SOLN
1.0000 mg | INTRAMUSCULAR | Status: DC | PRN
  Administered 2020-09-05 – 2020-09-06 (×3): 2 mg via INTRAVENOUS
  Filled 2020-09-05 (×3): qty 1

## 2020-09-05 MED ORDER — IBUPROFEN 200 MG PO TABS
600.0000 mg | ORAL_TABLET | Freq: Four times a day (QID) | ORAL | Status: DC | PRN
  Administered 2020-09-05: 600 mg via ORAL
  Filled 2020-09-05: qty 3

## 2020-09-05 MED ORDER — MAGNESIUM SULFATE 2 GM/50ML IV SOLN
2.0000 g | Freq: Once | INTRAVENOUS | Status: AC
  Administered 2020-09-05: 2 g via INTRAVENOUS
  Filled 2020-09-05: qty 50

## 2020-09-05 MED ORDER — HYDRALAZINE HCL 20 MG/ML IJ SOLN
10.0000 mg | INTRAMUSCULAR | Status: AC | PRN
  Administered 2020-09-05: 20 mg via INTRAVENOUS
  Filled 2020-09-05: qty 1

## 2020-09-05 MED ORDER — POTASSIUM CHLORIDE 20 MEQ PO PACK
40.0000 meq | PACK | ORAL | Status: AC
  Administered 2020-09-05 (×3): 40 meq via ORAL
  Filled 2020-09-05 (×3): qty 2

## 2020-09-05 MED ORDER — POTASSIUM CHLORIDE 10 MEQ/100ML IV SOLN
10.0000 meq | INTRAVENOUS | Status: DC
  Administered 2020-09-05 (×2): 10 meq via INTRAVENOUS
  Filled 2020-09-05 (×3): qty 100

## 2020-09-05 MED ORDER — LORAZEPAM 1 MG PO TABS
1.0000 mg | ORAL_TABLET | ORAL | Status: DC | PRN
  Administered 2020-09-06: 2 mg via ORAL
  Filled 2020-09-05: qty 2

## 2020-09-05 MED ORDER — LEVOTHYROXINE SODIUM 25 MCG PO TABS
25.0000 ug | ORAL_TABLET | Freq: Every day | ORAL | Status: DC
  Administered 2020-09-05 – 2020-09-10 (×6): 25 ug via ORAL
  Filled 2020-09-05 (×6): qty 1

## 2020-09-05 MED ORDER — CLONAZEPAM 0.5 MG PO TABS
1.0000 mg | ORAL_TABLET | Freq: Two times a day (BID) | ORAL | Status: DC | PRN
  Administered 2020-09-05 – 2020-09-07 (×6): 1 mg via ORAL
  Filled 2020-09-05 (×5): qty 2
  Filled 2020-09-05: qty 1

## 2020-09-05 MED ORDER — CALCIUM GLUCONATE-NACL 1-0.675 GM/50ML-% IV SOLN
1.0000 g | Freq: Once | INTRAVENOUS | Status: AC
  Administered 2020-09-05: 1000 mg via INTRAVENOUS
  Filled 2020-09-05: qty 50

## 2020-09-05 NOTE — Progress Notes (Signed)
AM K+ 3.0 with creat 1.18 and GFR > 60. CCM ELink electrolyte protocol initiated.

## 2020-09-05 NOTE — Progress Notes (Signed)
eLink Physician-Brief Progress Note Patient Name: Darryl Mitchell DOB: 1975-06-14 MRN: [id number]   Date of Service  09/05/2020  HPI/Events of Note  Patient with elevated  blood pressure, he needs a PRN order for iv anti-hypertensive medications.Marland Kitchen  eICU Interventions  PRN iv Hydralazine ordered.        Jaydis Duchene U Hollie Wojahn 09/05/2020, 1:21 AM

## 2020-09-05 NOTE — Progress Notes (Signed)
NAME:  Darryl Mitchell, MRN:  [id number], DOB:  10/30/1975, LOS: 2 ADMISSION DATE:  09/03/2020, CONSULTATION DATE:  09/03/20 REFERRING MD:  Tegeler, CHIEF COMPLAINT:  chest pain   History of Present Illness:  45 year old man presenting with seizure-like activity and recurrent torsades converted with defib x 7 and mag x 4g.  Had some vague left chest and shoulder pain preceding.  Cardiology to see.  Admits to heavy EtOH use last drink 2 days ago, never had w/d this bad before.  Does have an AICD that also shocked him, this is going to be interrogated.  Visiting from [place] does not know his meds.  Pertinent  Medical History  HTN CHF s/p AICD (he says 25% ef)  Significant Hospital Events: Including procedures, antibiotic start and stop dates in addition to other pertinent events   6/9 admitted requiring multiple shocks boluses of magnesium  Interim History / Subjective:   No more episodes of arrhythmia.  Has remained hemodynamically stable.  Complains of clicking sound at the left chest wall  Objective   Blood pressure 122/85, pulse 90, temperature 99.1 F (37.3 C), temperature source Oral, resp. rate 18, weight 100.5 kg, SpO2 92 %.        Intake/Output Summary (Last 24 hours) at 09/05/2020 0819 Last data filed at 09/05/2020 0758 Gross per 24 hour  Intake 655.1 ml  Output 940 ml  Net -284.9 ml   Filed Weights   09/03/20 2100 09/03/20 2300 09/04/20 0445  Weight: 108.9 kg 100.4 kg 100.5 kg    Examination: Gen:      No acute distress HEENT:  EOMI, sclera anicteric Neck:     No masses; no thyromegaly Lungs:    Clear to auscultation bilaterally; normal respiratory effort CV:         Regular rate and rhythm; no murmurs Abd:      + bowel sounds; soft, non-tender; no palpable masses, no distension Ext:    No edema; adequate peripheral perfusion, digit amputation Skin:      Warm and dry; no rash Neuro: alert and oriented x 3 Psych: normal mood and affect   Labs/imaging that I  havepersonally reviewed  (right click and "Reselect all SmartList Selections" daily)   Potassium 3.0, creatinine 1.18, mag 1.9 Chest x-ray with vascular congestion  Resolved Hospital Problem list   N/a  Assessment & Plan:  Recurrent torsades in patient with hx of heavy alcohol abuse and known CHF with AICD in place.  Defib x 6-7, 4g mag given.  EKG without STEMI. Baseline cardiomyopathy.  EF during this admission is 10% Elevated troponins likely due to demand Cardiology is on board.  May require input from advanced heart failure and ischemia eval He will need to establish with cardiology clinic in [facility] Continue aspirin, Coreg Replete potassium Unsure what clicking sound from around the area of the AICD is.  He tells me that it started after he had a fall 5 days ago.  Device interrogation is pending.  Order CT chest abdomen pelvis to make sure there is no injury from fall  Severe EtOH w/d Phenobarb taper, thiamine, folate CIWA protocol with Ativan as needed  Best practice (right click and "Reselect all SmartList Selections" daily)  Diet:  PO diet Pain/Anxiety/Delirium protocol (if indicated): No VAP protocol (if indicated): Not indicated DVT prophylaxis: Subcutaneous Heparin GI prophylaxis: N/A Glucose control:  SSI Yes Central venous access:  N/A Arterial line:  N/A Foley:  N/A Mobility:  bed rest  PT consulted:  N/A Last date of multidisciplinary goals of care discussion [pending] Code Status:  full code Disposition: Transfer to progressive and hospitalist service  Critical care time: NA    Chilton Greathouse MD [facility] See Amion for pager  If no response to pager , please call [phone] until 7pm After 7:00 pm call Elink  [phone] 09/05/2020, 8:29 AM

## 2020-09-05 NOTE — Progress Notes (Signed)
"  Clicking" noise auscultated on L lower lobe during initial assessment. Was advised by previous RN that this was found during day shift and addressed by the primary team who ordered EKG and CXR, which were both negative. Patient reports falling a few weeks ago and c/o pain "all over." Patient states that the clicking sound was not present prior to this admission and shocks administered in the ED. Pt states he is afraid that the "clicking" means that the ICD may defibrillate him. This nurse reassured patient that the ICD should not defibrillate with him being in normal sinus rhythm on the monitor. Reassured pt that his concerns about the continued clicking and persistent pain will be addressed with the primary team during rounds tomorrow. Elink notified tonight of persistent pain, no new orders at this time. Attempted to provide comfort for pt via heat packs on his left side.  Barbaraann Cao, RN  09/05/2020

## 2020-09-06 DIAGNOSIS — I5043 Acute on chronic combined systolic (congestive) and diastolic (congestive) heart failure: Secondary | ICD-10-CM

## 2020-09-06 DIAGNOSIS — E876 Hypokalemia: Secondary | ICD-10-CM

## 2020-09-06 LAB — CBC
HCT: 37.3 % — ABNORMAL LOW (ref 39.0–52.0)
Hemoglobin: 12.8 g/dL — ABNORMAL LOW (ref 13.0–17.0)
MCH: 28.4 pg (ref 26.0–34.0)
MCHC: 34.3 g/dL (ref 30.0–36.0)
MCV: 82.9 fL (ref 80.0–100.0)
Platelets: 117 10*3/uL — ABNORMAL LOW (ref 150–400)
RBC: 4.5 MIL/uL (ref 4.22–5.81)
RDW: 16.2 % — ABNORMAL HIGH (ref 11.5–15.5)
WBC: 7.6 10*3/uL (ref 4.0–10.5)
nRBC: 1.3 % — ABNORMAL HIGH (ref 0.0–0.2)

## 2020-09-06 LAB — GLUCOSE, CAPILLARY
Glucose-Capillary: 116 mg/dL — ABNORMAL HIGH (ref 70–99)
Glucose-Capillary: 120 mg/dL — ABNORMAL HIGH (ref 70–99)
Glucose-Capillary: 120 mg/dL — ABNORMAL HIGH (ref 70–99)
Glucose-Capillary: 159 mg/dL — ABNORMAL HIGH (ref 70–99)
Glucose-Capillary: 160 mg/dL — ABNORMAL HIGH (ref 70–99)

## 2020-09-06 LAB — BASIC METABOLIC PANEL
Anion gap: 8 (ref 5–15)
BUN: 13 mg/dL (ref 6–20)
CO2: 35 mmol/L — ABNORMAL HIGH (ref 22–32)
Calcium: 7 mg/dL — ABNORMAL LOW (ref 8.9–10.3)
Chloride: 95 mmol/L — ABNORMAL LOW (ref 98–111)
Creatinine, Ser: 1.35 mg/dL — ABNORMAL HIGH (ref 0.61–1.24)
GFR, Estimated: >60 mL/min (ref >60)
Glucose, Bld: 142 mg/dL — ABNORMAL HIGH (ref 70–99)
Potassium: 3.2 mmol/L — ABNORMAL LOW (ref 3.5–5.1)
Sodium: 138 mmol/L (ref 135–145)

## 2020-09-06 LAB — HEPATIC FUNCTION PANEL
ALT: 32 U/L (ref 0–44)
AST: 80 U/L — ABNORMAL HIGH (ref 15–41)
Albumin: 2.5 g/dL — ABNORMAL LOW (ref 3.5–5.0)
Alkaline Phosphatase: 172 U/L — ABNORMAL HIGH (ref 38–126)
Bilirubin, Direct: 0.6 mg/dL — ABNORMAL HIGH (ref 0.0–0.2)
Indirect Bilirubin: 0.7 mg/dL (ref 0.3–0.9)
Total Bilirubin: 1.3 mg/dL — ABNORMAL HIGH (ref 0.3–1.2)
Total Protein: 4.9 g/dL — ABNORMAL LOW (ref 6.5–8.1)

## 2020-09-06 LAB — PROTIME-INR
INR: 1.2 (ref 0.8–1.2)
Prothrombin Time: 15.2 seconds (ref 11.4–15.2)

## 2020-09-06 LAB — MAGNESIUM: Magnesium: 2.2 mg/dL (ref 1.7–2.4)

## 2020-09-06 LAB — PHOSPHORUS: Phosphorus: 3.7 mg/dL (ref 2.5–4.6)

## 2020-09-06 MED ORDER — POTASSIUM CHLORIDE CRYS ER 20 MEQ PO TBCR
40.0000 meq | EXTENDED_RELEASE_TABLET | Freq: Every day | ORAL | Status: DC
  Administered 2020-09-07 – 2020-09-10 (×4): 40 meq via ORAL
  Filled 2020-09-06 (×5): qty 2

## 2020-09-06 MED ORDER — POTASSIUM CHLORIDE 10 MEQ/100ML IV SOLN
10.0000 meq | INTRAVENOUS | Status: AC
  Administered 2020-09-06 (×3): 10 meq via INTRAVENOUS
  Filled 2020-09-06 (×4): qty 100

## 2020-09-06 MED ORDER — POTASSIUM CHLORIDE 20 MEQ PO PACK
40.0000 meq | PACK | ORAL | Status: AC
  Administered 2020-09-06 (×2): 40 meq via ORAL
  Filled 2020-09-06 (×2): qty 2

## 2020-09-06 MED ORDER — SPIRONOLACTONE 25 MG PO TABS
25.0000 mg | ORAL_TABLET | Freq: Every day | ORAL | Status: DC
  Administered 2020-09-06 – 2020-09-10 (×5): 25 mg via ORAL
  Filled 2020-09-06 (×5): qty 1

## 2020-09-06 NOTE — Progress Notes (Signed)
Patients CBG result is 159. Patient refused insulin coverage. RN educated patient on importance of maintaining appropriate blood glucose levels. Patient still refused.

## 2020-09-06 NOTE — Plan of Care (Signed)
  Problem: Activity: Goal: Risk for activity intolerance will decrease Outcome: Progressing   Problem: Education: Goal: Knowledge of General Education information will improve Description: Including pain rating scale, medication(s)/side effects and non-pharmacologic comfort measures Outcome: Progressing   Problem: Health Behavior/Discharge Planning: Goal: Ability to manage health-related needs will improve Outcome: Progressing   Problem: Clinical Measurements: Goal: Ability to maintain clinical measurements within normal limits will improve Outcome: Progressing Goal: Will remain free from infection Outcome: Progressing Goal: Diagnostic test results will improve Outcome: Progressing Goal: Respiratory complications will improve Outcome: Progressing Goal: Cardiovascular complication will be avoided Outcome: Progressing   Problem: Activity: Goal: Risk for activity intolerance will decrease Outcome: Progressing   Problem: Nutrition: Goal: Adequate nutrition will be maintained Outcome: Progressing   Problem: Coping: Goal: Level of anxiety will decrease Outcome: Progressing   Problem: Elimination: Goal: Will not experience complications related to bowel motility Outcome: Progressing Goal: Will not experience complications related to urinary retention Outcome: Progressing   Problem: Pain Managment: Goal: General experience of comfort will improve Outcome: Progressing   Problem: Safety: Goal: Ability to remain free from injury will improve Outcome: Progressing   Problem: Skin Integrity: Goal: Risk for impaired skin integrity will decrease Outcome: Progressing   Problem: Education: Goal: Ability to demonstrate management of disease process will improve Outcome: Progressing Goal: Ability to verbalize understanding of medication therapies will improve Outcome: Progressing Goal: Individualized Educational Video(s) Outcome: Progressing   Problem: Activity: Goal:  Capacity to carry out activities will improve Outcome: Progressing   Problem: Cardiac: Goal: Ability to achieve and maintain adequate cardiopulmonary perfusion will improve Outcome: Progressing   

## 2020-09-06 NOTE — Progress Notes (Signed)
PROGRESS NOTE   Darryl Mitchell  FGH:[id number] DOB: 1975/08/18 DOA: 09/03/2020 PCP: Pcp, No  Brief Narrative:  45 year old blk male originally from [place] EtOH, HTN, CHF status post AICD ([facility] cardiology [place]) Prior [facility] 2017+ cervical/lumbosacral spinal trauma?  On narcotics Known HF R EF 19% previously (an ICM negative stress 10/2014) with HF improved EF subsequently as followed by cardiology in [place] It appears he is essentially homeless as of 08/20/2020 secondary to divorce and has poor access to medical care or medications for the past 6 to 7 months prior to coming to [place] He was admitted to [facility] alcohol detox center recently but was transferred to [facility] 08/21/20 with anasarca decompensated RHF and cardiorenal syndrome At that hospitalization he was worked up, discharged on goal-directed medical therapy for HFrEF  Patient admitted to [facility] feeling "funny" 09/03/2020 Pacemaker defibrillator from several times he was started on amiodarone cardiology and critical care saw the patient  He was found to have recurrent torsades and was defibrillated 6-7 times cardiology saw the patient and continues to follow 09/04/2020 echo = EF 10%-severe LV dilatation grade 3 diastolic restrictive dysfunction  Hospital-Problem based course  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 to low-dose Xanax Discontinue phenobarbital at that time He has never attended a 12-step program and does not seem to want assistance with cessation Underlying anxiety Low-dose hydroxyzine if okayed by EP Hold Remeron at this time and trazodone 50 Acute superimposed on chronic HFrEF-HFpEF EF 67% grade 3 diastolic  dysfunction Continue potassium check a.m. magnesium Aldactone 25 added 6/12 Net fluid status is slightly up, weight 100-->102 kg however monitor for now and reassess in a.m. Aortic root dilatation-39 mm on TTE Needs serial outpatient echoes per cardiology Previous homelessness We will ask case manager to look in on him   DVT prophylaxis: Heparin Code Status: Full Family Communication: none + Disposition:  Status is: Inpatient  Remains inpatient appropriate because:Hemodynamically unstable, Unsafe d/c plan, and IV treatments appropriate due to intensity of illness or inability to take PO  Dispo: The patient is from: Home              Anticipated d/c is to: Home              Patient currently is not medically stable to d/c.   Difficult to place patient Yes       Consultants:  Cardiology  Procedures:  Echocardiogram 6/11 = EF 89% grade 3 diastolic restrictive dysfunction mild aortic root dilatation 39 mm  Antimicrobials: None   Subjective: Awake coherent no distress Doing fair Complains consistently of clicking in the left chest Images are reassuring for no overt pathology-I wonder if this is because of his cardioversion He seems reassured that this is something that does not need work-up He is having a good lunch of chicken and mac & cheese and we will probably need to discuss nutrition with him prior to discharge I read in the notes that he has no hepatosplenomegaly homeless lately so we will get TOC to investigate and maybe help him if he needs support  Objective: Vitals:   09/06/20 0700 09/06/20 0738 09/06/20 1125 09/06/20 1200  BP:  114/90 (!) 108/91 113/88  Pulse: 76 74 90 81  Resp: _0 Temp:  97.8 F (36.6 C) 97.9 F (36.6 C)   TempSrc:  Oral Oral   SpO2: 94% 95% 94% 99%  Weight: 102.8 kg       Intake/Output Summary (Last 24 hours) at 09/06/2020 1349 Last data filed at 09/06/2020 1000 Gross per 24 hour  Intake 714 ml  Output --  Net 714 ml    Filed Weights   09/03/20 2300 09/04/20 0445 09/06/20 0700  Weight: 100.4 kg 100.5 kg 102.8 kg    Examination:  Alert pleasant no distress EOMI NCAT no focal deficit metaglip Tattoos Multiple finger loss on right side Chest clear no rales or rhonchi A9-V9 holosystolic murmur Abdomen soft Lower extremity edema Abdomen soft nontender no rebound no guarding  Data Reviewed: personally reviewed   CBC    Component Value Date/Time   WBC 7.6 09/06/2020 0029   RBC 4.50 09/06/2020 0029   HGB 12.8 (L) 09/06/2020 0029   HCT 37.3 (L) 09/06/2020 0029   PLT 117 (L) 09/06/2020 0029   MCV 82.9 09/06/2020 0029   MCH 28.4 09/06/2020 0029   MCHC 34.3 09/06/2020 0029   RDW 16.2 (H) 09/06/2020 0029   CMP Latest Ref Rng & Units 09/06/2020 09/05/2020 09/04/2020  Glucose 70 - 99 mg/dL 142(H) 96 113(H)  BUN 6 - 20 mg/dL _1 Creatinine 0.61 - 1.24 mg/dL 1.35(H) 1.18 1.19  Sodium 135 - 145 mmol/L 138 138 136  Potassium 3.5 - 5.1 mmol/L 3.2(L) 3.0(L) 3.2(L)  Chloride 98 - 111 mmol/L 95(L) 90(L) 88(L)  CO2 22 - 32 mmol/L 35(H) 35(H) 34(H)  Calcium 8.9 - 10.3 mg/dL 7.0(L) 7.1(L) 6.9(L)  Total Protein 6.5 - 8.1 g/dL 4.9(L) 6.0(L) -  Total Bilirubin 0.3 - 1.2 mg/dL 1.3(H) 1.6(H) -  Alkaline Phos 38 - 126 U/L 172(H) 194(H) -  AST 15 - 41 U/L 80(H) 95(H) -  ALT 0 - 44 U/L 32 37 -     Radiology Studies: CT ABDOMEN PELVIS WO CONTRAST  Result Date: 09/05/2020 CLINICAL DATA:  45 year old male with non localized abdominal and chest pain and a history of recent fall EXAM: CT CHEST, ABDOMEN AND PELVIS WITHOUT CONTRAST TECHNIQUE: Multidetector CT imaging of the chest, abdomen and pelvis was performed following the standard protocol without IV contrast. COMPARISON:  None. FINDINGS: CT CHEST FINDINGS Cardiovascular: Limited evaluation in the absence of intravenous contrast. No evidence of aortic aneurysm. Mild cardiomegaly. Trace pericardial effusion. No significant atherosclerotic plaque.  Mediastinum/Nodes: Unremarkable CT appearance of the thyroid gland. No suspicious mediastinal or hilar adenopathy. No soft tissue mediastinal mass. The thoracic esophagus is unremarkable. Lungs/Pleura: Moderate right and small left layering pleural effusions. Associated dependent atelectasis. Mild ground-glass attenuation airspace opacities in a tree-in-bud configuration along a peribronchovascular distribution throughout the non atelectatic portion of the left lower lobe. Otherwise, the lungs are clear. Musculoskeletal: External cardiac defibrillator overlies the left chest. The generator is present along the left lateral chest wall. CT ABDOMEN PELVIS FINDINGS Hepatobiliary: Borderline hepatomegaly. No discrete hepatic lesion. High attenuation material within the gallbladder lumen consistent with cholelithiasis. No biliary ductal dilatation. Pancreas: Unremarkable. No pancreatic ductal dilatation or surrounding inflammatory changes. Spleen: Normal in size without focal abnormality. Adrenals/Urinary Tract: Normal adrenal glands. No hydronephrosis, nephrolithiasis or enhancing renal mass. The ureters and bladder are unremarkable. Stomach/Bowel: No evidence of focal bowel wall thickening or bowel obstruction. Vascular/Lymphatic: Limited evaluation in the absence of intravenous contrast. No significant atherosclerotic plaque or evidence of aneurysm. No suspicious lymphadenopathy. Reproductive:  Prostate is unremarkable. Other: Small volume ascites.  Small fat containing umbilical hernia. Musculoskeletal: No acute fracture or aggressive appearing lytic or blastic osseous lesion. IMPRESSION: 1. Cardiomegaly with an external defibrillator device in place. Findings are consistent with the clinical history of CHF. 2. Ground-glass attenuation airspace opacities in a tree-in-bud configuration throughout the left lower lobe concerning for an active infectious/inflammatory process such as bronchopneumonia. 3. Borderline  hepatomegaly. Differential considerations include chronic hepatic venous congestion in the setting of right heart failure versus hepatic cirrhosis. 4. Moderate right and small left layering pleural effusions with associated atelectasis. 5. Cholelithiasis. 6. Small volume ascites. 7. Small fat containing umbilical hernia. Electronically Signed   By: Jacqulynn Cadet M.D.   On: 09/05/2020 13:43   CT CHEST WO CONTRAST  Result Date: 09/05/2020 CLINICAL DATA:  45 year old male with non localized abdominal and chest pain and a history of recent fall EXAM: CT CHEST, ABDOMEN AND PELVIS WITHOUT CONTRAST TECHNIQUE: Multidetector CT imaging of the chest, abdomen and pelvis was performed following the standard protocol without IV contrast. COMPARISON:  None. FINDINGS: CT CHEST FINDINGS Cardiovascular: Limited evaluation in the absence of intravenous contrast. No evidence of aortic aneurysm. Mild cardiomegaly. Trace pericardial effusion. No significant atherosclerotic plaque. Mediastinum/Nodes: Unremarkable CT appearance of the thyroid gland. No suspicious mediastinal or hilar adenopathy. No soft tissue mediastinal mass. The thoracic esophagus is unremarkable. Lungs/Pleura: Moderate right and small left layering pleural effusions. Associated dependent atelectasis. Mild ground-glass attenuation airspace opacities in a tree-in-bud configuration along a peribronchovascular distribution throughout the non atelectatic portion of the left lower lobe. Otherwise, the lungs are clear. Musculoskeletal: External cardiac defibrillator overlies the left chest. The generator is present along the left lateral chest wall. CT ABDOMEN PELVIS FINDINGS Hepatobiliary: Borderline hepatomegaly. No discrete hepatic lesion. High attenuation material within the gallbladder lumen consistent with cholelithiasis. No biliary ductal dilatation. Pancreas: Unremarkable. No pancreatic ductal dilatation or surrounding inflammatory changes. Spleen: Normal in  size without focal abnormality. Adrenals/Urinary Tract: Normal adrenal glands. No hydronephrosis, nephrolithiasis or enhancing renal mass. The ureters and bladder are unremarkable. Stomach/Bowel: No evidence of focal bowel wall thickening or bowel obstruction. Vascular/Lymphatic: Limited evaluation in the absence of intravenous contrast. No significant atherosclerotic plaque or evidence of aneurysm. No suspicious lymphadenopathy. Reproductive: Prostate is unremarkable. Other: Small volume ascites.  Small fat containing umbilical hernia. Musculoskeletal: No acute fracture or aggressive appearing lytic or blastic osseous lesion. IMPRESSION: 1. Cardiomegaly with an external defibrillator device in place. Findings are consistent with the clinical history of CHF. 2. Ground-glass attenuation airspace opacities in a tree-in-bud configuration throughout the left lower lobe concerning for an active infectious/inflammatory process such as bronchopneumonia. 3. Borderline hepatomegaly. Differential considerations include chronic hepatic venous congestion in the setting of right heart failure versus hepatic cirrhosis. 4. Moderate right and small left layering pleural effusions with associated atelectasis. 5. Cholelithiasis. 6. Small volume ascites. 7. Small fat containing umbilical hernia. Electronically Signed   By: Jacqulynn Cadet M.D.   On: 09/05/2020 13:43   DG Chest Port 1 View  Result Date: 09/05/2020 CLINICAL DATA:  Abnormal respiration.  Ventricular tachycardia. EXAM: PORTABLE CHEST 1 VIEW COMPARISON:  One day prior FINDINGS: Again identified are external pacer/defibrillator. Midline trachea. Moderate cardiomegaly. No pleural effusion or pneumothorax. No lobar consolidation. Pulmonary venous congestion is not significantly changed. Patchy areas of bibasilar atelectasis remain. IMPRESSION: Cardiomegaly with similar pulmonary venous congestion. Electronically Signed   By: Abigail Miyamoto M.D.   On: 09/05/2020 08:00    DG CHEST PORT 1 VIEW  Result Date: 09/04/2020 CLINICAL DATA:  Cardiac arrest. EXAM: PORTABLE CHEST 1 VIEW COMPARISON:  Earlier film, same date. FINDINGS: The external pacer paddle and wire/defibrillator are stable. The heart is enlarged but stable. The mediastinal and hilar contours are within normal limits. Patchy bibasilar atelectasis and mild vascular congestion but no overt pulmonary edema or definite pleural effusions. No pneumothorax. IMPRESSION: 1. Stable cardiac enlargement. 2. Patchy bibasilar atelectasis and mild vascular congestion but no overt pulmonary edema. Electronically Signed   By: Marijo Sanes M.D.   On: 09/04/2020 15:01   ECHOCARDIOGRAM COMPLETE  Result Date: 09/04/2020    ECHOCARDIOGRAM REPORT   Patient Name:   Darryl Mitchell  Date of Exam: 09/04/2020 Medical Rec #:  [id number]  Height: Accession #:    [id number] Weight:       221.6 lb Date of Birth:  [birth year] BSA:          2.162 m Patient Age:    62 years   BP:           124/98 mmHg Patient Gender: M          HR:           77 bpm. Exam Location:  Inpatient Procedure: 2D Echo, Cardiac Doppler, Color Doppler and Intracardiac            Opacification Agent Indications:    Dilated cardiomyopathy  History:        Patient has no prior history of Echocardiogram examinations.                 Risk Factors:Hypertension.  Sonographer:    Maudry Mayhew MHA, RDMS, RVT, RDCS Referring Phys: [id number] [place]  1. Left ventricular ejection fraction, by estimation, is 10%. The left ventricle has normal function. The left ventricle demonstrates global hypokinesis. The left ventricular internal cavity size was severely dilated. Left ventricular diastolic parameters are consistent with Grade III diastolic dysfunction (restrictive). Elevated left ventricular end-diastolic pressure.  2. Right ventricular systolic function is severely reduced. The right ventricular size is moderately enlarged.  3. Left atrial size was mildly dilated.  4.  The mitral valve is normal in structure. Mild mitral valve regurgitation. No evidence of mitral stenosis.  5. The aortic valve is normal in structure. Aortic valve regurgitation is not visualized. No aortic stenosis is present.  6. Aortic dilatation noted. There is mild dilatation of the aortic root, measuring 39 mm.  7. The inferior vena cava is normal in size with greater than 50% respiratory variability, suggesting right atrial pressure of 3 mmHg. FINDINGS  Left Ventricle: Left ventricular ejection fraction, by estimation, is 10%. The left ventricle has normal function. The left ventricle demonstrates global hypokinesis. Definity contrast agent was given IV to delineate the left ventricular endocardial borders. The left ventricular internal cavity size was severely dilated. There is no left ventricular hypertrophy. Left ventricular diastolic parameters are consistent with Grade III diastolic dysfunction (restrictive). Elevated left ventricular end-diastolic pressure. Right Ventricle: The right ventricular size is moderately enlarged. No increase in right ventricular wall thickness. Right ventricular systolic function is severely reduced. Left Atrium: Left atrial size was mildly dilated. Right Atrium: Right atrial size was normal in size. Pericardium: There is no evidence of pericardial effusion. Mitral Valve: The mitral valve is normal in structure. Mild mitral valve regurgitation. No evidence of mitral valve stenosis. Tricuspid Valve: The tricuspid valve is normal in structure. Tricuspid valve regurgitation is mild . No evidence of tricuspid stenosis. Aortic Valve: The aortic  valve is normal in structure. Aortic valve regurgitation is not visualized. No aortic stenosis is present. Aortic valve mean gradient measures 1.0 mmHg. Aortic valve peak gradient measures 2.2 mmHg. Aortic valve area, by VTI measures 3.87 cm. Pulmonic Valve: The pulmonic valve was normal in structure. Pulmonic valve regurgitation is mild.  No evidence of pulmonic stenosis. Aorta: Aortic dilatation noted. There is mild dilatation of the aortic root, measuring 39 mm. Venous: The inferior vena cava is normal in size with greater than 50% respiratory variability, suggesting right atrial pressure of 3 mmHg. IAS/Shunts: No atrial level shunt detected by color flow Doppler.  LEFT VENTRICLE PLAX 2D LVIDd:         7.00 cm      Diastology LVIDs:         6.60 cm      LV e' medial:    2.48 cm/s LV PW:         0.70 cm      LV E/e' medial:  36.1 LV IVS:        0.50 cm      LV e' lateral:   4.00 cm/s LVOT diam:     2.90 cm      LV E/e' lateral: 22.4 LV SV:         48 LV SV Index:   22 LVOT Area:     6.61 cm  LV Volumes (MOD) LV vol d, MOD A2C: 168.0 ml LV vol d, MOD A4C: 169.0 ml LV vol s, MOD A2C: 158.0 ml LV vol s, MOD A4C: 166.0 ml LV SV MOD A2C:     10.0 ml LV SV MOD A4C:     169.0 ml LV SV MOD BP:      5.4 ml RIGHT VENTRICLE RV S prime:     7.88 cm/s TAPSE (M-mode): 1.3 cm LEFT ATRIUM              Index       RIGHT ATRIUM           Index LA diam:        4.50 cm  2.08 cm/m  RA Area:     21.10 cm LA Vol (A2C):   104.0 ml 48.10 ml/m RA Volume:   64.40 ml  29.79 ml/m LA Vol (A4C):   56.1 ml  25.95 ml/m LA Biplane Vol: 78.2 ml  36.17 ml/m  AORTIC VALVE AV Area (Vmax):    4.51 cm AV Area (Vmean):   4.49 cm AV Area (VTI):     3.87 cm AV Vmax:           74.50 cm/s AV Vmean:          53.200 cm/s AV VTI:            0.124 m AV Peak Grad:      2.2 mmHg AV Mean Grad:      1.0 mmHg LVOT Vmax:         50.90 cm/s LVOT Vmean:        36.200 cm/s LVOT VTI:          0.073 m LVOT/AV VTI ratio: 0.59  AORTA Ao Root diam: 3.30 cm MITRAL VALVE               TRICUSPID VALVE MV Area (PHT): 3.77 cm    TR Peak grad:   16.5 mmHg MV Decel Time: 201 msec    TR Vmax:        203.00 cm/s MR Peak  grad: 53.3 mmHg MR Mean grad: 42.0 mmHg    SHUNTS MR Vmax:      365.00 cm/s  Systemic VTI:  0.07 m MR Vmean:     306.0 cm/s   Systemic Diam: 2.90 cm MV E velocity: 89.60 cm/s MV A velocity:  32.30 cm/s MV E/A ratio:  2.77 Fransico Him MD Electronically signed by Fransico Him MD Signature Date/Time: 09/04/2020/5:51:35 PM    Final      Scheduled Meds:  aspirin EC  81 mg Oral Daily   carvedilol  12.5 mg Oral BID WC   folic acid  1 mg Oral Daily   heparin  5,000 Units Subcutaneous Q8H   hydrALAZINE  10 mg Oral Q8H   insulin aspart  0-15 Units Subcutaneous Q4H   isosorbide mononitrate  15 mg Oral Daily   levothyroxine  25 mcg Oral Q0600   multivitamin with minerals  1 tablet Oral Daily   PHENObarbital  65 mg Intravenous Q8H   Followed by   PHENObarbital  65 mg Intravenous BID   [START ON 09/07/2020] potassium chloride  40 mEq Oral Daily   spironolactone  25 mg Oral Daily   thiamine  100 mg Oral Daily   Continuous Infusions:  potassium chloride 10 mEq (09/06/20 1204)     LOS: 3 days   Time spent: [place], MD [facility] To contact the attending provider between 7A-7P or the covering provider during after hours 7P-7A, please log into the web site [email or web address] and access using universal [place] password for that web site. If you do not have the password, please call the hospital operator.  09/06/2020, 1:49 PM

## 2020-09-06 NOTE — Progress Notes (Signed)
Progress Note   Subjective   Doing well today, the patient denies CP or SOB.  No new concerns  Inpatient Medications    Scheduled Meds:  aspirin EC  81 mg Oral Daily   carvedilol  12.5 mg Oral BID WC   folic acid  1 mg Oral Daily   heparin  5,000 Units Subcutaneous Q8H   hydrALAZINE  10 mg Oral Q8H   insulin aspart  0-15 Units Subcutaneous Q4H   isosorbide mononitrate  15 mg Oral Daily   levothyroxine  25 mcg Oral Q0600   multivitamin with minerals  1 tablet Oral Daily   PHENObarbital  65 mg Intravenous Q8H   Followed by   PHENObarbital  65 mg Intravenous BID   potassium chloride  40 mEq Oral Q4H   thiamine  100 mg Oral Daily   Continuous Infusions:  PRN Meds: clonazePAM, docusate sodium, hydrALAZINE, ibuprofen, LORazepam **OR** LORazepam, polyethylene glycol   Vital Signs    Vitals:   09/06/20 0417 09/06/20 0541 09/06/20 0700 09/06/20 0738  BP:  108/81  114/90  Pulse: 83  76 74  Resp:   20 18  Temp:    97.8 F (36.6 C)  TempSrc:    Oral  SpO2:   94% 95%  Weight:   102.8 kg     Intake/Output Summary (Last 24 hours) at 09/06/2020 0948 Last data filed at 09/05/2020 2300 Gross per 24 hour  Intake 257.2 ml  Output 200 ml  Net 57.2 ml   Filed Weights   09/03/20 2300 09/04/20 0445 09/06/20 0700  Weight: 100.4 kg 100.5 kg 102.8 kg    Telemetry    Sinus, no arrhythmias - Personally Reviewed  Physical Exam   GEN- The patient is well appearing, alert and oriented x 3 today.   Head- normocephalic, atraumatic Eyes-  Sclera clear, conjunctiva pink Ears- hearing intact Oropharynx- clear Neck- supple, Lungs-  normal work of breathing Heart- Regular rate and rhythm  GI- soft  Extremities- no clubbing, cyanosis, or edema  MS- no significant deformity or atrophy Skin- no rash or lesion Psych- euthymic mood, full affect Neuro- strength and sensation are intact   Labs    Chemistry Recent Labs  Lab 09/04/20 0251 09/04/20 0949 09/05/20 0158  09/06/20 0029  NA 139 136 138 138  K 2.9* 3.2* 3.0* 3.2*  CL 88* 88* 90* 95*  CO2 39* 34* 35* 35*  GLUCOSE 105* 113* 96 142*  BUN 10 11 11 13   CREATININE 1.14 1.19 1.18 1.35*  CALCIUM 7.1* 6.9* 7.1* 7.0*  PROT 6.1*  --  6.0* 4.9*  ALBUMIN 3.2*  --  3.0* 2.5*  AST 82*  --  95* 80*  ALT 37  --  37 32  ALKPHOS 198*  --  194* 172*  BILITOT 1.2  --  1.6* 1.3*  GFRNONAA >60 >60 >60 >60  ANIONGAP 12 14 13 8      Hematology Recent Labs  Lab 09/03/20 2011 09/03/20 2036 09/06/20 0029  WBC 5.7  --  7.6  RBC 4.80  --  4.50  HGB 13.6 15.3 12.8*  HCT 39.9 45.0 37.3*  MCV 83.1  --  82.9  MCH 28.3  --  28.4  MCHC 34.1  --  34.3  RDW 16.3*  --  16.2*  PLT 206  --  117*     Patient ID   45 y.o. male with PMH significant of chronic systolic and diastolic heart failure, nonischemic cardiomyopathy s/p BS ICD, alcohol abuse,  hypertension, anxiety, depression, hypothyroidism, medication noncompliance, type 2 diabetes, OSA untreated, obesity, who presented with torsades and severe alcohol withdrawal.  He reports numerous ICD shocks in the past 24 hours.  He has longstanding history of alcohol abuse, sober  for 2 years, relapsed 2 months ago, decided to detox through [facility] [place] 2 days ago with subsequent severe withdrawal.  He is a limited historian with limited insight and knowledge of his medical condition. Assessment & Plan    1.  Torsades No further arrhythmia Goal K >3.9 Avoid ETOH Continue coreg Avoid qt prolonging drugs  2. Acute on chronic combined systolic and diastolic dysfunction Add spironolactone today Resume lasix once K is replete  3. HTN Stable No change required today  4. Hypokalemia K must be replete before discharge given arrhythmia Give additional IV K today Hold lasix Add spironolactone  5. ETOH Avoidance is necessary for him to do well long term  General cardiology to manage heart failure while here EP to see as needed  Hillis Range MD,  [facility] 09/06/2020 9:48 AM

## 2020-09-06 NOTE — Consult Note (Signed)
WOC Nurse Consult Note: Patient receiving care in [facility] 2C01 Reason for Consult: Wound on right big toe Wound type: Partial thickness wound to the posterior side of the right great toe Pressure Injury POA: NA Measurement: See flow sheet Wound bed: Black Drainage (amount, consistency, odor) none Periwound: Dry cracked Dressing procedure/placement/frequency: Clean the foot with soap and water, rinse and dry. Paint the wound on the right great toe with Betadine and allow to air dry. Apply daily.  Monitor the wound area(s) for worsening of condition such as: Signs/symptoms of infection, increase in size, development of or worsening of odor, development of pain, or increased pain at the affected locations.   Notify the medical team if any of these develop.  Thank you for the consult. WOC nurse will not follow at this time.   Please re-consult the WOC team if needed.  Renaldo Reel Katrinka Blazing, MSN, RN, CMSRN, Angus Seller, [facility] Wound Treatment Associate Pager [phone]

## 2020-09-06 NOTE — Hospital Course (Addendum)
45 year old blk male originally from [place] Heavy EtOH, HTN, CHF status post AICD ([facility] cardiology [place]) Prior [facility] 2017+ cervical/lumbosacral spinal trauma?  On narcotics Known HF R EF 19% previously (an ICM negative stress 10/2014) with HF improved EF subsequently as followed by cardiology in [place] It appears he is essentially homeless as of 08/20/2020 secondary to divorce and has poor access to medical care or medications for the past 6 to 7 months prior to coming to [place] He was admitted to [facility] alcohol detox center recently but was transferred to [facility] 08/21/20 with anasarca decompensated RHF and cardiorenal syndrome At that hospitalization he was worked up, discharged on goal-directed medical therapy for HFrEF  Patient admitted to [facility] feeling "funny" 09/03/2020 Pacemaker defibrillator from several times he was started on amiodarone cardiology and critical care saw the patient  He was found to have recurrent torsades and was defibrillated 6-7 times cardiology saw the patient and continues to follow. Echo on 6/10 showed EF 10%-severe LV dilatation grade 3 diastolic restrictive dysfunction.  Diuresed very well with IV Lasix per cardiology. Net IO Since Admission: -7,539.78 mL [09/10/20 1624]  Patient clinically improved and stable for d/c today.

## 2020-09-07 ENCOUNTER — Encounter (HOSPITAL_COMMUNITY): Payer: Self-pay | Admitting: Internal Medicine

## 2020-09-07 LAB — GLUCOSE, CAPILLARY
Glucose-Capillary: 111 mg/dL — ABNORMAL HIGH (ref 70–99)
Glucose-Capillary: 137 mg/dL — ABNORMAL HIGH (ref 70–99)
Glucose-Capillary: 123 mg/dL — ABNORMAL HIGH (ref 70–99)
Glucose-Capillary: 75 mg/dL (ref 70–99)

## 2020-09-07 LAB — COMPREHENSIVE METABOLIC PANEL
ALT: 34 U/L (ref 0–44)
AST: 65 U/L — ABNORMAL HIGH (ref 15–41)
Albumin: 2.8 g/dL — ABNORMAL LOW (ref 3.5–5.0)
Alkaline Phosphatase: 166 U/L — ABNORMAL HIGH (ref 38–126)
Anion gap: 7 (ref 5–15)
BUN: 13 mg/dL (ref 6–20)
CO2: 34 mmol/L — ABNORMAL HIGH (ref 22–32)
Calcium: 7.5 mg/dL — ABNORMAL LOW (ref 8.9–10.3)
Chloride: 96 mmol/L — ABNORMAL LOW (ref 98–111)
Creatinine, Ser: 1.29 mg/dL — ABNORMAL HIGH (ref 0.61–1.24)
GFR, Estimated: >60 mL/min (ref >60)
Glucose, Bld: 144 mg/dL — ABNORMAL HIGH (ref 70–99)
Potassium: 3.6 mmol/L (ref 3.5–5.1)
Sodium: 137 mmol/L (ref 135–145)
Total Bilirubin: 1.1 mg/dL (ref 0.3–1.2)
Total Protein: 5.3 g/dL — ABNORMAL LOW (ref 6.5–8.1)

## 2020-09-07 LAB — MAGNESIUM: Magnesium: 2 mg/dL (ref 1.7–2.4)

## 2020-09-07 LAB — PHOSPHORUS: Phosphorus: 2.5 mg/dL (ref 2.5–4.6)

## 2020-09-07 MED ORDER — POTASSIUM CHLORIDE CRYS ER 20 MEQ PO TBCR
40.0000 meq | EXTENDED_RELEASE_TABLET | Freq: Two times a day (BID) | ORAL | Status: AC
  Administered 2020-09-07 (×2): 40 meq via ORAL
  Filled 2020-09-07 (×2): qty 2

## 2020-09-07 MED ORDER — ISOSORBIDE MONONITRATE ER 30 MG PO TB24: 15.0000 mg | ORAL_TABLET | Freq: Once | ORAL | Status: DC

## 2020-09-07 MED ORDER — MAGNESIUM SULFATE 2 GM/50ML IV SOLN
2.0000 g | Freq: Once | INTRAVENOUS | Status: AC
  Administered 2020-09-07: 2 g via INTRAVENOUS
  Filled 2020-09-07: qty 50

## 2020-09-07 MED ORDER — ALPRAZOLAM 0.25 MG PO TABS: 0.2500 mg | ORAL_TABLET | Freq: Two times a day (BID) | ORAL | Status: DC | PRN

## 2020-09-07 MED ORDER — POTASSIUM CHLORIDE CRYS ER 20 MEQ PO TBCR: 40.0000 meq | EXTENDED_RELEASE_TABLET | Freq: Once | ORAL | Status: DC

## 2020-09-07 MED ORDER — DAPAGLIFLOZIN PROPANEDIOL 10 MG PO TABS
10.0000 mg | ORAL_TABLET | Freq: Every day | ORAL | Status: DC
  Administered 2020-09-07 – 2020-09-10 (×4): 10 mg via ORAL
  Filled 2020-09-07 (×4): qty 1

## 2020-09-07 MED ORDER — HYDRALAZINE HCL 25 MG PO TABS
25.0000 mg | ORAL_TABLET | Freq: Two times a day (BID) | ORAL | Status: DC
  Administered 2020-09-07: 25 mg via ORAL
  Filled 2020-09-07: qty 1

## 2020-09-07 MED ORDER — IBUPROFEN 200 MG PO TABS: 400.0000 mg | ORAL_TABLET | Freq: Four times a day (QID) | ORAL | Status: DC | PRN

## 2020-09-07 MED ORDER — ISOSORBIDE MONONITRATE ER 30 MG PO TB24
30.0000 mg | ORAL_TABLET | Freq: Every day | ORAL | Status: DC
  Administered 2020-09-08 – 2020-09-09 (×2): 30 mg via ORAL
  Filled 2020-09-07 (×2): qty 1

## 2020-09-07 MED ORDER — LOSARTAN POTASSIUM 50 MG PO TABS
50.0000 mg | ORAL_TABLET | Freq: Every day | ORAL | Status: DC
  Administered 2020-09-07 – 2020-09-10 (×4): 50 mg via ORAL
  Filled 2020-09-07 (×4): qty 1

## 2020-09-07 MED ORDER — DIGOXIN 125 MCG PO TABS
0.1250 mg | ORAL_TABLET | Freq: Every day | ORAL | Status: DC
  Administered 2020-09-07 – 2020-09-10 (×4): 0.125 mg via ORAL
  Filled 2020-09-07 (×4): qty 1

## 2020-09-07 MED ORDER — POTASSIUM CHLORIDE CRYS ER 20 MEQ PO TBCR: 20.0000 meq | EXTENDED_RELEASE_TABLET | Freq: Once | ORAL | Status: DC

## 2020-09-07 MED ORDER — FUROSEMIDE 10 MG/ML IJ SOLN
40.0000 mg | Freq: Two times a day (BID) | INTRAMUSCULAR | Status: AC
  Administered 2020-09-07 (×2): 40 mg via INTRAVENOUS
  Filled 2020-09-07 (×2): qty 4

## 2020-09-07 NOTE — Progress Notes (Signed)
CARDIAC REHAB PHASE I   PRE:  Rate/Rhythm: 77 SR with 1 PVC    BP: sitting 114/92    SaO2: 95 RA  MODE:  Ambulation: 240 ft   POST:  Rate/Rhythm: 92 SR    BP: sitting 124/105     SaO2: 95 RA  Pt to BR then ambulated hall with standby assist. Slow, c/o significant fatigue. No arrhythmias noted. To recliner. When I stepped out of the room, pt sts he felt he was going to pass out. Now feels fine. Nothing noted on telemetry except "apneic" artifact. Left HF book and pt reviewing now.  Will f/u later.  [id number]  Harriet Masson CES, ACSM 09/07/2020 1:42 PM

## 2020-09-07 NOTE — TOC Initial Note (Addendum)
Transition of Care (TOC) - Initial/Assessment Note  Heart Failure   Patient Details  Name: Yuki Purves MRN: [id number] Date of Birth: 1975-08-29  Transition of Care [facility]) CM/SW Contact:    Asanti Craigo, LCSWA Phone Number: 09/07/2020, 12:09 PM  Clinical Narrative:                 CSW spoke with the patient at bedside and completed a very brief SDOH screening with the patient who reported he is currently living with his mother in [place], [place] and his sister is here as well. Patient reported having Medicare A and B but that was when he was in [place] and needs to see about getting it transferred to [facility]. CSW encouraged Mr. Heesch to call the number listed on his insurance card about getting services in [place]. CSW spoke with Mr. Taddei about etoh use and Mr. Schliep reported that he is done drinking but didn't want a referral to inpatient/outpatient treatment but agreed to substance use resources to look at. Mr. Tsang presented apprehensive with CSW and therefore, CSW will check back with Mr. Staubs later on. CSW provided the patient with the social workers name and position and to reach out to CSW as other social needs arise.       CSW will continue to follow throughout discharge.  Expected Discharge Plan: Home/Self Care Barriers to Discharge: Continued Medical Work up   Patient Goals and CMS Choice        Expected Discharge Plan and Services Expected Discharge Plan: Home/Self Care In-house Referral: Clinical Social Work Discharge Planning Services: CM Consult   Living arrangements for the past 2 months: No permanent address (Patient reports he is staying with his mother in [place], [place])                                      Prior Living Arrangements/Services Living arrangements for the past 2 months: No permanent address (Patient reports he is staying with his mother in [place], [place])   Patient language and need for interpreter reviewed:: Yes        Need for Family Participation in Patient  Care: No (Comment) Care giver support system in place?: No (comment)   Criminal Activity/Legal Involvement Pertinent to Current Situation/Hospitalization: No - Comment as needed  Activities of Daily Living      Permission Sought/Granted                  Emotional Assessment Appearance:: Appears stated age Attitude/Demeanor/Rapport: Engaged Affect (typically observed): Pleasant Orientation: : Oriented to Self, Oriented to Place, Oriented to  Time, Oriented to Situation Alcohol / Substance Use: Alcohol Use Psych Involvement: No (comment)  Admission diagnosis:  Torsades de pointes (HCC) [I47.2] ICD (implantable cardioverter-defibrillator) in place [Z95.810] Patient Active Problem List   Diagnosis Date Noted   Torsades de pointes (HCC) 09/03/2020   PCP:  Pcp, No Pharmacy:   [id number] - [place] [place] - [address] AT [facility] [address] [place] [address] Phone: [phone] Fax: [phone]     Social Determinants of Health (SDOH) Interventions Food Insecurity Interventions: Intervention Not Indicated Financial Strain Interventions: Other (Comment) (CSW encouraged pt. to call the number listed on his Medicare card to see about services in [place]) Housing Interventions: Intervention Not Indicated, Other (Comment) (Patient reports he is currently staying with his mother in [place] and his sister is  here as well.) Transportation Interventions: Intervention Not Indicated  Readmission Risk Interventions No flowsheet data found.  Jannetta Massey, MSW, LCSWA [phone] Heart Failure Social Worker

## 2020-09-07 NOTE — Progress Notes (Signed)
PROGRESS NOTE   Darryl Mitchell  ENI:[id number] DOB: [birth year] DOA: 09/03/2020 PCP: Pcp, No  Brief Narrative:  45 year old blk male originally from [place] Heavy EtOH, HTN, CHF status post AICD ([facility]ology [place]) Prior [facility] 2017+ cervical/lumbosacral spinal trauma?  On narcotics Known HF R EF 19% previously (an ICM negative stress 10/2014) with HF improved EF subsequently as followed by cardiology in [place] It appears he is essentially homeless as of 08/20/2020 secondary to divorce and has poor access to medical care or medications for the past 6 to 7 months prior to coming to [place] He was admitted to [facility] alcohol detox center recently but was transferred to [facility] 08/21/20 with anasarca decompensated RHF and cardiorenal syndrome At that hospitalization he was worked up, discharged on goal-directed medical therapy for HFrEF  Patient admitted to [facility] feeling "funny" 09/03/2020 Pacemaker defibrillator from several times he was started on amiodarone cardiology and critical care saw the patient  He was found to have recurrent torsades and was defibrillated 6-7 times cardiology saw the patient and continues to follow 09/04/2020 echo = EF 10%-severe LV dilatation grade 3 diastolic restrictive dysfunction  Hospital-Problem based course  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 to low-dose Xanax Discontinue phenobarbital at that time He has never attended a 12-step program --appreciate social work input Underlying anxiety Low-dose hydroxyzine on discharge for anxiety if okayed by EP Hold Remeron at this time and trazodone 50 Aortic root dilatation-39 mm on TTE Needs serial outpatient echoes per cardiology Previous homelessness We will go to the home of his mother on discharge   DVT prophylaxis: Heparin Code Status: Full Family Communication: none + Disposition:  Status is: Inpatient  Remains inpatient appropriate because:Hemodynamically unstable, Unsafe d/c plan, and IV treatments appropriate due to intensity of illness or inability to take PO  Dispo: The patient is from: Home              Anticipated d/c is to: Home              Patient currently is not medically stable to d/c.   Difficult to place patient Yes    Consultants:  Cardiology  Procedures:  Echocardiogram 6/11 = EF 10% grade 3 diastolic restrictive dysfunction mild aortic root dilatation 39 mm  Antimicrobials: None   Subjective:  No complaints eating drinking no chest pain Coherent - Fever, - chills, - nausea, - vomiting, - blurred vision, - double vision He does not complain of the clicking noise today that he was hearing yesterday  Objective: Vitals:   09/07/20 0509 09/07/20 0512 09/07/20 0700 09/07/20 1132  BP: (!) 110/92  107/71 113/79  Pulse:  (!) 199    Resp:  20 16 20   Temp:   98.7 F (37.1 C) 98 F (36.7 C)  TempSrc:   Oral Oral  SpO2:  100% 100% 97%  Weight:  104.6 kg  Height:  6' (1.829 m)      Intake/Output Summary (Last 24 hours) at 09/07/2020 1422 Last data filed at 09/07/2020 1300 Gross per 24 hour  Intake 1217.09 ml  Output 925 ml  Net 292.09 ml    Filed Weights   09/04/20 0445 09/06/20 0700 09/07/20 0512  Weight: 100.5 kg 102.8 kg 104.6 kg    Examination:  Pleasant coherent no  distress Not on oxygen today CTA B no rales no rhonchi--I did not examine the pacemaker site today S1-S2 seems to be in sinus Abdomen soft nontender no rebound no guarding Neurologically intact no focal deficit  Data Reviewed: personally reviewed   CBC    Component Value Date/Time   WBC 7.6 09/06/2020 0029   RBC 4.50 09/06/2020 0029   HGB 12.8 (L) 09/06/2020 0029   HCT 37.3 (L) 09/06/2020 0029   PLT 117 (L) 09/06/2020 0029   MCV 82.9 09/06/2020 0029   MCH 28.4 09/06/2020 0029   MCHC 34.3 09/06/2020 0029   RDW 16.2 (H) 09/06/2020 0029   CMP Latest Ref Rng & Units 09/07/2020 09/06/2020 09/05/2020  Glucose 70 - 99 mg/dL 400(Q) 676(P) 96  BUN 6 - 20 mg/dL 13 13 11   Creatinine 0.61 - 1.24 mg/dL ) 9.50(D) 3.26(Z  Sodium 135 - 145 mmol/L 137 138 138  Potassium 3.5 - 5.1 mmol/L 3.6 3.2(L) 3.0(L)  Chloride 98 - 111 mmol/L 96(L) 95(L) 90(L)  CO2 22 - 32 mmol/L 34(H) 35(H) 35(H)  Calcium 8.9 - 10.3 mg/dL 7.5(L) 7.0(L) 7.1(L)  Total Protein 6.5 - 8.1 g/dL 5.3(L) 4.9(L) 6.0(L)  Total Bilirubin 0.3 - 1.2 mg/dL 1.1 1.24) 5.8(K)  Alkaline Phos 38 - 126 U/L 166(H) 172(H) 194(H)  AST 15 - 41 U/L 65(H) 80(H) 95(H)  ALT 0 - 44 U/L 34 32 37     Radiology Studies: No results found.   Scheduled Meds:  aspirin EC  81 mg Oral Daily   carvedilol  12.5 mg Oral BID WC   dapagliflozin propanediol  10 mg Oral Daily   digoxin  0.125 mg Oral Daily   folic acid  1 mg Oral Daily   furosemide  40 mg Intravenous BID   heparin  5,000 Units Subcutaneous Q8H   hydrALAZINE  25 mg Oral BID   insulin aspart  0-15 Units Subcutaneous Q4H   [START ON 09/08/2020] isosorbide mononitrate  30 mg Oral Daily   levothyroxine  25 mcg Oral Q0600   losartan  50 mg Oral Daily   multivitamin with minerals  1 tablet Oral Daily   potassium chloride  40 mEq Oral Daily   potassium chloride  40 mEq Oral BID   spironolactone  25 mg Oral Daily   thiamine  100 mg Oral Daily   Continuous Infusions:     LOS: 4 days    Time spent: 40  30, MD [facility] To contact the attending provider between 7A-7P or the covering provider during after hours 7P-7A, please log into the web site [email or web address] and access using universal [place] password for that web site. If you do not have the password, please call the hospital operator.  09/07/2020, 2:22 PM

## 2020-09-07 NOTE — Care Management Important Message (Signed)
Important Message  Patient Details  Name: Darryl Mitchell MRN: [id number] Date of Birth: [birth year]   Medicare Important Message Given:  Yes     [name] Stefan Church 09/07/2020, 3:32 PM

## 2020-09-07 NOTE — Plan of Care (Signed)
  Problem: Activity: Goal: Risk for activity intolerance will decrease Outcome: Progressing   Problem: Education: Goal: Knowledge of General Education information will improve Description: Including pain rating scale, medication(s)/side effects and non-pharmacologic comfort measures Outcome: Progressing   Problem: Health Behavior/Discharge Planning: Goal: Ability to manage health-related needs will improve Outcome: Progressing   Problem: Clinical Measurements: Goal: Ability to maintain clinical measurements within normal limits will improve Outcome: Progressing Goal: Will remain free from infection Outcome: Progressing Goal: Diagnostic test results will improve Outcome: Progressing Goal: Respiratory complications will improve Outcome: Progressing Goal: Cardiovascular complication will be avoided Outcome: Progressing   Problem: Activity: Goal: Risk for activity intolerance will decrease Outcome: Progressing   Problem: Nutrition: Goal: Adequate nutrition will be maintained Outcome: Progressing   Problem: Coping: Goal: Level of anxiety will decrease Outcome: Progressing   Problem: Elimination: Goal: Will not experience complications related to bowel motility Outcome: Progressing Goal: Will not experience complications related to urinary retention Outcome: Progressing   Problem: Pain Managment: Goal: General experience of comfort will improve Outcome: Progressing   Problem: Safety: Goal: Ability to remain free from injury will improve Outcome: Progressing   Problem: Skin Integrity: Goal: Risk for impaired skin integrity will decrease Outcome: Progressing   Problem: Education: Goal: Ability to demonstrate management of disease process will improve Outcome: Progressing Goal: Ability to verbalize understanding of medication therapies will improve Outcome: Progressing Goal: Individualized Educational Video(s) Outcome: Progressing   Problem: Activity: Goal:  Capacity to carry out activities will improve Outcome: Progressing   Problem: Cardiac: Goal: Ability to achieve and maintain adequate cardiopulmonary perfusion will improve Outcome: Progressing

## 2020-09-07 NOTE — TOC CAGE-AID Note (Signed)
Transition of Care (TOC) - CAGE-AID Screening Heart Failure   Patient Details  Name: Darryl Mitchell MRN: [id number] Date of Birth: 15-Sep-1975  Transition of Care [facility]) CM/SW Contact:    Carsten Carstarphen, LCSWA Phone Number: 09/07/2020, 11:57 AM   Clinical Narrative: Patient admits to ETOH use. He states after this last situation he is going to quit drinking. CM offered inpatient/outpatient alcohol counseling and pt refused referral but agreed to look at substance use resources provided by CSW.     CAGE-AID Screening:    Have You Ever Felt You Ought to Cut Down on Your Drinking or Drug Use?: Yes Have People Annoyed You By Critizing Your Drinking Or Drug Use?: No Have You Felt Bad Or Guilty About Your Drinking Or Drug Use?: Yes Have You Ever Had a Drink or Used Drugs First Thing In The Morning to Steady Your Nerves or to Get Rid of a Hangover?: No CAGE-AID Score: 2  Substance Abuse Education Offered: Yes  Substance abuse interventions: SDOH Screening, Educational Materials     [employer], MSW, LCSWA [phone] Heart Failure Social Worker

## 2020-09-07 NOTE — Consult Note (Addendum)
Advanced Heart Failure Team Consult Note   Primary Physician: Pcp, No PCP-Cardiologist:  Dr Janalyn Shy   Reason for Consultation: Acute/Chronic Heart Failure   HPI:    Darryl Mitchell is seen today for evaluation of Acute/Chronic Heart Failure  at the request of Dr Shari Prows.  Mr Onder is a 45 year old with history of NICM 2016,  ICD, ETOH abuse, DMII, OSA, HTN, and tobacco abuse.  Sober 2 years but relapsed 2 months ago. Drinking 2 gallons of Vodka a day.  Prior to admit he was not taking his meds and doesn't know any the meds he had been taking.    2021 EF 40-45%.   Visiting from [place] but planning to stay in [place]. Presented to [facility] via EMS with (? seizure activity), VT/VF requiring external/internal shocks in the field.  EP consulted and felt VF  in the setting of ETOH abuse.  K/Mag replaced. Initially on amio drip but later stopped due to QT prolongation.   Pertinent labs: HS Trop [id number] , TSH 4.7 , HIV NR , creatinine 1.2, K 3.1   Echo this admit. EF 10%  RV severely reduced.   SH: Visiting his Mom from [place]. Drinks 2 gallons per day, smokes cigarette daily. He does not work  FH: No family history of coronary disease.   Review of Systems: [y] = yes, [ ]  = no   General: Weight gain [ ] ; Weight loss [ ] ; Anorexia [ ] ; Fatigue [Y ]; Fever [ ] ; Chills [ ] ; Weakness [ Y]  Cardiac: Chest pain/pressure [ ] ; Resting SOB [ ] ; Exertional SOB [ ] ; Orthopnea [ ] ; Pedal Edema [ ] ; Palpitations [ ] ; Syncope [ ] ; Presyncope [ ] ; Paroxysmal nocturnal dyspnea[ ]   Pulmonary: Cough [ ] ; Wheezing[ ] ; Hemoptysis[ ] ; Sputum [ ] ; Snoring [ ]   GI: Vomiting[ ] ; Dysphagia[ ] ; Melena[ ] ; Hematochezia [ ] ; Heartburn[ ] ; Abdominal pain [ ] ; Constipation [ ] ; Diarrhea [ ] ; BRBPR [ ]   GU: Hematuria[ ] ; Dysuria [ ] ; Nocturia[ ]   Vascular: Pain in legs with walking [ ] ; Pain in feet with lying flat [ ] ; Non-healing sores [ ] ; Stroke [ ] ; TIA [ ] ; Slurred speech [ ] ;  Neuro: Headaches[ ] ; Vertigo[ ] ; Seizures[ ] ;  Paresthesias[ ] ;Blurred vision [ ] ; Diplopia [ ] ; Vision changes [ ]   Ortho/Skin: Arthritis [ ] ; Joint pain [ ] ; Muscle pain [ ] ; Joint swelling [ ] ; Back Pain [ Y]; Rash [ ]   Psych: Depression[ ] ; Anxiety[ ]   Heme: Bleeding problems [ ] ; Clotting disorders [ ] ; Anemia [ ]   Endocrine: Diabetes [ Y]; Thyroid dysfunction[ ]   Home Medications Prior to Admission medications   Medication Sig Start Date End Date Taking? Authorizing Provider  carvedilol (COREG) 25 MG tablet Take 25 mg by mouth 2 (two) times daily. 08/06/20  Yes [provider]  furosemide (LASIX) 80 MG tablet Take 80 mg by mouth 2 (two) times daily. 08/25/20  Yes [provider]  gabapentin (NEURONTIN) 300 MG capsule Take 30 mg by mouth 3 (three) times daily. 08/25/20  Yes [provider]  hydrALAZINE (APRESOLINE) 10 MG tablet Take 10 mg by mouth 2 (two) times daily. 08/06/20  Yes [provider]  hydrOXYzine (VISTARIL) 50 MG capsule Take 50 mg by mouth every 6 (six) hours as needed for anxiety. 08/06/20  Yes [provider]  levothyroxine (SYNTHROID) 25 MCG tablet Take 25 mcg by mouth daily. 08/25/20  Yes [provider]  mirtazapine (REMERON) 30  MG tablet Take 30 mg by mouth at bedtime as needed (sleep). 08/25/20  Yes [provider]  nicotine (NICODERM CQ - DOSED IN MG/24 HOURS) 21 mg/24hr patch Place 21 mg onto the skin daily. 08/06/20  Yes [provider]  spironolactone (ALDACTONE) 25 MG tablet Take 25 mg by mouth daily. 08/25/20  Yes [provider]  traZODone (DESYREL) 50 MG tablet Take 50 mg by mouth at bedtime as needed for sleep. 08/06/20  Yes [provider]    Past Medical History: Past Medical History:  Diagnosis Date   CHF (congestive heart failure) (HCC)    Hypertension     Past Surgical History: ICD placement 2016   Family History: History reviewed. No pertinent family history.  Social History: Social History   Socioeconomic  History   Marital status: Divorced    Spouse name: Not on file   Number of children: Not on file   Years of education: Not on file   Highest education level: Not on file  Occupational History   Not on file  Tobacco Use   Smoking status: Every Day    Packs/day: 3.00    Pack years: 0.00    Types: Cigarettes   Smokeless tobacco: Current  Substance and Sexual Activity   Alcohol use: Yes    Comment: 2 gallons of Vodka   Drug use: Not on file   Sexual activity: Not on file  Other Topics Concern   Not on file  Social History Narrative   Not on file   Social Determinants of Health   Financial Resource Strain: Not on file  Food Insecurity: Not on file  Transportation Needs: Not on file  Physical Activity: Not on file  Stress: Not on file  Social Connections: Not on file    Allergies:  Allergies  Allergen Reactions   Lisinopril Swelling    Objective:    Vital Signs:   Temp:  [97.9 F (36.6 C)-98.7 F (37.1 C)] 98.7 F (37.1 C) (06/13 0700) Pulse Rate:  [78-199] 199 (06/13 0512) Resp:  [13-20] 16 (06/13 0700) BP: (101-119)/(71-96) 107/71 (06/13 0700) SpO2:  [94 %-100 %] 100 % (06/13 0700) Weight:  [104.6 kg] 104.6 kg (06/13 0512) Last BM Date: 09/06/20  Weight change: Filed Weights   09/04/20 0445 09/06/20 0700 09/07/20 0512  Weight: 100.5 kg 102.8 kg 104.6 kg    Intake/Output:   Intake/Output Summary (Last 24 hours) at 09/07/2020 1117 Last data filed at 09/06/2020 1930 Gross per 24 hour  Intake 743.09 ml  Output --  Net 743.09 ml      Physical Exam    General:  No resp difficulty HEENT: normal Neck: supple. JVP 9-10 . Carotids 2+ bilat; no bruits. No lymphadenopathy or thyromegaly appreciated. Cor: PMI nondisplaced. Regular rate & rhythm. No rubs, gallops or murmurs. Lungs: clear Abdomen: soft, nontender, nondistended. No hepatosplenomegaly. No bruits or masses. Good bowel sounds. Extremities: no cyanosis, clubbing, rash, edema Neuro: alert &  orientedx3, cranial nerves grossly intact. moves all 4 extremities w/o difficulty. Affect pleasant   Telemetry  SR 90s  EKG    N/A  Labs   Basic Metabolic Panel: Recent Labs  Lab 09/03/20 2011 09/03/20 2036 09/04/20 0251 09/04/20 0949 09/05/20 0158 09/06/20 0029 09/07/20 0033  NA 138   < > 139 136 138 138 137  K 3.1*   < > 2.9* 3.2* 3.0* 3.2* 3.6  CL 83*   < > 88* 88* 90* 95* 96*  CO2 29  --  39* 34* 35* 35* 34*  GLUCOSE 161*   < > 105* 113* 96 142* 144*  BUN 13   < > 10 11 11 13 13   CREATININE 1.65*   < > 1.14 1.19 1.18 1.35* 1.29*  CALCIUM 7.2*  --  7.1* 6.9* 7.1* 7.0* 7.5*  MG 2.5*  --  2.3  --  1.9 2.2 2.0  PHOS  --   --  4.1  --  3.5 3.7 2.5   < > = values in this interval not displayed.    Liver Function Tests: Recent Labs  Lab 09/03/20 2011 09/04/20 0251 09/05/20 0158 09/06/20 0029 09/07/20 0033  AST 98* 82* 95* 80* 65*  ALT 40 37 37 32 34  ALKPHOS 215* 198* 194* 172* 166*  BILITOT 1.6* 1.2 1.6* 1.3* 1.1  PROT 6.7 6.1* 6.0* 4.9* 5.3*  ALBUMIN 3.5 3.2* 3.0* 2.5* 2.8*   No results for input(s): LIPASE, AMYLASE in the last 168 hours. No results for input(s): AMMONIA in the last 168 hours.  CBC: Recent Labs  Lab 09/03/20 2011 09/03/20 2036 09/06/20 0029  WBC 5.7  --  7.6  HGB 13.6 15.3 12.8*  HCT 39.9 45.0 37.3*  MCV 83.1  --  82.9  PLT 206  --  117*    Cardiac Enzymes: No results for input(s): CKTOTAL, CKMB, CKMBINDEX, TROPONINI in the last 168 hours.  BNP: BNP (last 3 results) No results for input(s): BNP in the last 8760 hours.  ProBNP (last 3 results) No results for input(s): PROBNP in the last 8760 hours.   CBG: Recent Labs  Lab 09/06/20 0402 09/06/20 1123 09/06/20 1544 09/06/20 2107 09/07/20 0642  GLUCAP 120* 120* 160* 116* 111*    Coagulation Studies: Recent Labs    09/06/20 0757  LABPROT 15.2  INR 1.2     Imaging   No results found.   Medications:     Current Medications:  aspirin EC  81 mg Oral Daily    carvedilol  12.5 mg Oral BID WC   dapagliflozin propanediol  10 mg Oral Daily   digoxin  0.125 mg Oral Daily   folic acid  1 mg Oral Daily   furosemide  40 mg Intravenous BID   heparin  5,000 Units Subcutaneous Q8H   hydrALAZINE  25 mg Oral BID   insulin aspart  0-15 Units Subcutaneous Q4H   [START ON 09/08/2020] isosorbide mononitrate  30 mg Oral Daily   levothyroxine  25 mcg Oral Q0600   losartan  50 mg Oral Daily   multivitamin with minerals  1 tablet Oral Daily   potassium chloride  20 mEq Oral Once   potassium chloride  40 mEq Oral Daily   potassium chloride  40 mEq Oral Once   spironolactone  25 mg Oral Daily   thiamine  100 mg Oral Daily    Infusions:  magnesium sulfate bolus IVPB       Assessment/Plan    VT/VF Multiple shocks for VT/VF in the setting ETOH abuse.  K and Mag replace. Will need to keep K >4 and Mag >2.  EP following. Off amio with QT prolongation.  Has 09/10/2020 ICD Discussed no driving 6 months.   2. A/C Biventricular Heart Failure, presume NICM--> ETOH  ECHO EF 10% with severe RV failure back in 2021 EF was up to 40-45%  - Volume status mildly elevated. Give 40 mg IV lasix x 2. Watch lytes.  - No entresto with h/o angioedema with lisinopril  -  Continue current dose of carvedilol  - Change hydralazine to 25 mg twice a day to help with compliance.  - Continue imdur 30 mg daiy - Continue spiro 25 mg daily - Start farxiga 10 mg daily  - Start digoxin 0.125 mg daily  - Renal function stable.   3. ETOH Abuse -Off phenobarbital today. On thiamine.  -Refer to SW for cessation options   4. HTN  -Elevated on admit. Now controlled.   5. Noncompliance No recent home medications.   6. DMII Hgb AiC 6.6  Adding SGLT2 today.   7. Tobacco Abuse Discussed smoking cessation.    Consult cardiac rehab.  Consult SW for ETOH abuse.   Length of Stay: 4  Amy Clegg, NP  09/07/2020, 11:17 AM  Advanced Heart Failure Team Pager [phone]  (M-F; 7a - 5p)  Please contact [facility] for night-coverage after hours (4p -7a ) and weekends on [email or web address]  Patient seen with NP, agree with the above note.   Patient was admitted with VT/VF and ICD shocks as well as external shocks after ETOH binge with hypokalemia and hypomagnesemia.  H/o nonischemic cardiomyopathy but not taking meds at home x months. Amiodarone was stopped due to prolonged QT, electrolytes replaced.  He developed ETOH withdrawal (very heavy drinker).   Currently stable, denies dyspnea, no further arrhythmias.   General: NAD Neck: JVP 10-12 cm, no thyromegaly or thyroid nodule.  Lungs: Clear to auscultation bilaterally with normal respiratory effort. CV: Lateral PMI.  Heart regular S1/S2, no S3/S4, no murmur.  No peripheral edema.   Abdomen: Soft, nontender, no hepatosplenomegaly, no distention.  Skin: Intact without lesions or rashes.  Neurologic: Alert and oriented x 3.  Psych: Normal affect. Extremities: No clubbing or cyanosis.  HEENT: Normal.   1. VT/VF: Multiple shocks in setting of ETOH binge with low K and Mg.  Has [employer] ICD.  Electrolytes replaced.  No further VT.  HS-TnI mildly elevated without trend.  - Continue Coreg 12.5 mg bid.  - Not on amiodarone with initial markedly prolonged QT interval.  - Ischemic unlikely, has long history of NICM.   2. Acute on chronic systolic CHF: Biventricular failure, followed in [place] in the past.  Echoes have shown EF up and down, appears to fluctuate with medication compliance and likely with ETOH intake. Echo this admission with EF 10%, severe LV dilation, severely reduced RV systolic function, mild MR.  Reports prior cath without CAD though I cannot find report.  He had a normal nuclear stress test in the past as well. Cardiomyopathy is likely due to heavy ETOH abuse (2 gallons/day vodka currently) and possibly poor HTN control.  On exam, I think that he is at least mildly volume overloaded. - Lasix 40  mg IV bid today, replace K.  - No ACEI or ARNI due to angioedema with ACEI.  - Start losartan 50 mg daily.  - Continue Coreg 12.5 mg bid.  - Hydralazine 25 bid + Imdur 30.  - Spironolactone 25 daily. - Add Farxiga 10 mg daily.  - Add digoxin 0.125 daily.  - Imperative to stop ETOH.  3. ETOH abuse: Has finished phenobarbital for withdrawal.  - Strongly urged to quit.  4. HTN: BP control with the above meds.  5. Smoking: Enouraged to quit.   Marca Ancona 09/07/2020 12:15 PM

## 2020-09-08 DIAGNOSIS — R0789 Other chest pain: Secondary | ICD-10-CM

## 2020-09-08 LAB — CBC WITH DIFFERENTIAL/PLATELET
Abs Immature Granulocytes: 0.02 10*3/uL (ref 0.00–0.07)
Basophils Absolute: 0 10*3/uL (ref 0.0–0.1)
Basophils Relative: 0 %
Eosinophils Absolute: 0.2 10*3/uL (ref 0.0–0.5)
Eosinophils Relative: 2 %
HCT: 40.8 % (ref 39.0–52.0)
Hemoglobin: 13.6 g/dL (ref 13.0–17.0)
Immature Granulocytes: 0 %
Lymphocytes Relative: 42 %
Lymphs Abs: 3.4 10*3/uL (ref 0.7–4.0)
MCH: 28.7 pg (ref 26.0–34.0)
MCHC: 33.3 g/dL (ref 30.0–36.0)
MCV: 86.1 fL (ref 80.0–100.0)
Monocytes Absolute: 0.4 10*3/uL (ref 0.1–1.0)
Monocytes Relative: 6 %
Neutro Abs: 3.9 10*3/uL (ref 1.7–7.7)
Neutrophils Relative %: 50 %
Platelets: 148 10*3/uL — ABNORMAL LOW (ref 150–400)
RBC: 4.74 MIL/uL (ref 4.22–5.81)
RDW: 17.2 % — ABNORMAL HIGH (ref 11.5–15.5)
WBC: 7.9 10*3/uL (ref 4.0–10.5)
nRBC: 0.5 % — ABNORMAL HIGH (ref 0.0–0.2)

## 2020-09-08 LAB — MAGNESIUM: Magnesium: 2.1 mg/dL (ref 1.7–2.4)

## 2020-09-08 LAB — BASIC METABOLIC PANEL
Anion gap: 9 (ref 5–15)
BUN: 14 mg/dL (ref 6–20)
CO2: 29 mmol/L (ref 22–32)
Calcium: 8 mg/dL — ABNORMAL LOW (ref 8.9–10.3)
Chloride: 98 mmol/L (ref 98–111)
Creatinine, Ser: 1.35 mg/dL — ABNORMAL HIGH (ref 0.61–1.24)
GFR, Estimated: >60 mL/min (ref >60)
Glucose, Bld: 93 mg/dL (ref 70–99)
Potassium: 4.5 mmol/L (ref 3.5–5.1)
Sodium: 136 mmol/L (ref 135–145)

## 2020-09-08 LAB — GLUCOSE, CAPILLARY
Glucose-Capillary: 108 mg/dL — ABNORMAL HIGH (ref 70–99)
Glucose-Capillary: 93 mg/dL (ref 70–99)
Glucose-Capillary: 98 mg/dL (ref 70–99)
Glucose-Capillary: 136 mg/dL — ABNORMAL HIGH (ref 70–99)
Glucose-Capillary: 138 mg/dL — ABNORMAL HIGH (ref 70–99)

## 2020-09-08 MED ORDER — BENZONATATE 100 MG PO CAPS
100.0000 mg | ORAL_CAPSULE | Freq: Three times a day (TID) | ORAL | Status: DC | PRN
  Administered 2020-09-08 – 2020-09-09 (×4): 100 mg via ORAL
  Filled 2020-09-08 (×4): qty 1

## 2020-09-08 MED ORDER — NAPROXEN 250 MG PO TABS
500.0000 mg | ORAL_TABLET | Freq: Two times a day (BID) | ORAL | Status: DC
  Administered 2020-09-09 – 2020-09-10 (×3): 500 mg via ORAL
  Filled 2020-09-08 (×5): qty 2

## 2020-09-08 MED ORDER — HYDRALAZINE HCL 50 MG PO TABS
50.0000 mg | ORAL_TABLET | Freq: Two times a day (BID) | ORAL | Status: DC
  Administered 2020-09-08 – 2020-09-09 (×4): 50 mg via ORAL
  Filled 2020-09-08 (×4): qty 1

## 2020-09-08 MED ORDER — CLONAZEPAM 0.5 MG PO TABS
0.5000 mg | ORAL_TABLET | Freq: Two times a day (BID) | ORAL | Status: DC | PRN
  Administered 2020-09-08 – 2020-09-10 (×4): 0.5 mg via ORAL
  Filled 2020-09-08 (×4): qty 1

## 2020-09-08 MED ORDER — MELATONIN 5 MG PO TABS
5.0000 mg | ORAL_TABLET | Freq: Every evening | ORAL | Status: DC | PRN
  Administered 2020-09-08: 5 mg via ORAL
  Filled 2020-09-08: qty 1

## 2020-09-08 MED ORDER — FUROSEMIDE 10 MG/ML IJ SOLN
80.0000 mg | Freq: Two times a day (BID) | INTRAMUSCULAR | Status: AC
  Administered 2020-09-08 – 2020-09-09 (×3): 80 mg via INTRAVENOUS
  Filled 2020-09-08 (×3): qty 8

## 2020-09-08 NOTE — Evaluation (Signed)
Physical Therapy Evaluation Patient Details Name: Darryl Mitchell MRN: [id number] DOB: [birth year] Today's Date: 09/08/2020   History of Present Illness  45 yo admitted 6/9 with seizure like activity with torsades de pointes s/p defibrillation x 7. PMhx: ETOH abuse, HTN, CHf s/p AICD, NICM, homelessness  Clinical Impression  Pt reports having only been at [facility] house for 2 days after moving from [place] and unable to recall her full home setup. Pt initially stating house 2 levels but that he will stay downstairs and later admitted that video games are upstairs and he plans to go up to play them. Pt reports frequent "drunk falls" PTA and able to acknowledge that he was always intoxicated. Pt with VSS throughout session with noted balance and cognitive deficits as limiting factors. Pt will benefit from acute therapy to maximize mobility, safety and function as well as OPPT to address balance as long as pt able to maintain sobriety to actually impact balance.   HR 96-102 SpO2 92-96% on RA    Follow Up Recommendations Outpatient PT    Equipment Recommendations  None recommended by PT    Recommendations for Other Services       Precautions / Restrictions Precautions Precautions: Fall Restrictions Weight Bearing Restrictions: No      Mobility  Bed Mobility Overal bed mobility: Modified Independent             General bed mobility comments: HOB elevated but not assist required    Transfers Overall transfer level: Modified independent Equipment used: None Transfers: Sit to/from Stand Sit to Stand: Min guard;Supervision         General transfer comment: pt able to stand from bed without assist  Ambulation/Gait Ambulation/Gait assistance: Min guard Gait Distance (Feet): 400 Feet   Gait Pattern/deviations: Step-through pattern;Decreased stride length   Gait velocity interpretation: >2.62 ft/sec, indicative of community ambulatory General Gait Details: pt with LOB with  looking to left during gait and able to perform head turns with decreased speed and cautious gait  Stairs Stairs: Yes Stairs assistance: Supervision Stair Management: Step to pattern;Forwards;No rails Number of Stairs: 3 General stair comments: pt with cautious ascent/ descent of stairs without rail with supervision for safety  Wheelchair Mobility    Modified Rankin (Stroke Patients Only)       Balance Overall balance assessment: Needs assistance   Sitting balance-Leahy Scale: Good       Standing balance-Leahy Scale: Good   Single Leg Stance - Right Leg: 0 Single Leg Stance - Left Leg: 0 Tandem Stance - Right Leg: 10 Tandem Stance - Left Leg: 10   Rhomberg - Eyes Closed: 25   High Level Balance Comments: LOB when looking left with gait             Pertinent Vitals/Pain Pain Assessment: Faces Pain Score: 3  Pain Location: L side Pain Descriptors / Indicators: Discomfort;Aching Pain Intervention(s): Limited activity within patient's tolerance    Home Living Family/patient expects to be discharged to:: Private residence Living Arrangements: Parent Available Help at Discharge: Family;Available PRN/intermittently Type of Home: House Home Access: Stairs to enter Entrance Stairs-Rails: None Entrance Stairs-Number of Steps: 3 Home Layout: Two level;Able to live on main level with bedroom/bathroom Home Equipment: None      Prior Function Level of Independence: Independent         Comments: independent and driving, working freelance, pt reports frequent "drunk falls"     Hand Dominance   Dominant Hand: Left    Extremity/Trunk Assessment  Upper Extremity Assessment Upper Extremity Assessment: Defer to OT evaluation    Lower Extremity Assessment Lower Extremity Assessment: Overall WFL for tasks assessed    Cervical / Trunk Assessment Cervical / Trunk Assessment: Normal  Communication   Communication: No difficulties  Cognition  Arousal/Alertness: Awake/alert Behavior During Therapy: WFL for tasks assessed/performed Overall Cognitive Status: Impaired/Different from baseline Area of Impairment: Memory;Safety/judgement                     Memory: Decreased short-term memory   Safety/Judgement: Decreased awareness of safety   Problem Solving: Slow processing General Comments: pt unable to recall if mom has a job or complete home setup of her house. Decreased safety awareness      General Comments General comments (skin integrity, edema, etc.): VSS on RA, SpO2 ranged from 90-96% HR 95-105 during activity    Exercises     Assessment/Plan    PT Assessment Patient needs continued PT services  PT Problem List Decreased activity tolerance;Decreased balance;Decreased cognition;Decreased mobility;Decreased safety awareness       PT Treatment Interventions Gait training;Balance training;Stair training;Functional mobility training;Therapeutic activities;Patient/family education;Cognitive remediation;DME instruction;Therapeutic exercise;Neuromuscular re-education    PT Goals (Current goals can be found in the Care Plan section)  Acute Rehab PT Goals Patient Stated Goal: play bloody video games PT Goal Formulation: With patient Time For Goal Achievement: 09/15/20 Potential to Achieve Goals: Good    Frequency Min 3X/week   Barriers to discharge Decreased caregiver support      Co-evaluation               AM-PAC PT "6 Clicks" Mobility  Outcome Measure Help needed turning from your back to your side while in a flat bed without using bedrails?: None Help needed moving from lying on your back to sitting on the side of a flat bed without using bedrails?: None Help needed moving to and from a bed to a chair (including a wheelchair)?: A Little Help needed standing up from a chair using your arms (e.g., wheelchair or bedside chair)?: A Little Help needed to walk in hospital room?: A Little Help  needed climbing 3-5 steps with a railing? : A Little 6 Click Score: 20    End of Session   Activity Tolerance: Patient tolerated treatment well Patient left: in chair;with call bell/phone within reach Nurse Communication: Mobility status PT Visit Diagnosis: Other abnormalities of gait and mobility (R26.89);Repeated falls (R29.6)    Time: 1020-1040 PT Time Calculation (min) (ACUTE ONLY): 20 min   Charges:   PT Evaluation $PT Eval Moderate Complexity: 1 Mod          Keylen Uzelac P, PT Acute Rehabilitation Services Pager: [phone] Office: [phone]   Akina Maish B Arjan Strohm 09/08/2020, 11:29 AM

## 2020-09-08 NOTE — Progress Notes (Signed)
PROGRESS NOTE   Darryl Mitchell  NAT:[id number] DOB: Feb 18, 1976 DOA: 09/03/2020 PCP: Pcp, No  Brief Narrative:  45 year old blk male originally from [place] Heavy EtOH, HTN, CHF status post AICD ([facility] cardiology [place]) Prior [facility] 2017+ cervical/lumbosacral spinal trauma?  On narcotics Known HF R EF 19% previously (an ICM negative stress 10/2014) with HF improved EF subsequently as followed by cardiology in [place] It appears he is essentially homeless as of 08/20/2020 secondary to divorce and has poor access to medical care or medications for the past 6 to 7 months prior to coming to [place] He was admitted to [facility] alcohol detox center recently but was transferred to [facility] 08/21/20 with anasarca decompensated RHF and cardiorenal syndrome At that hospitalization he was worked up, discharged on goal-directed medical therapy for HFrEF  Patient admitted to [facility] feeling "funny" 09/03/2020 Pacemaker defibrillator from several times he was started on amiodarone cardiology and critical care saw the patient  He was found to have recurrent torsades and was defibrillated 6-7 times cardiology saw the patient and continues to follow 09/04/2020 echo = EF 10%-severe LV dilatation grade 3 diastolic restrictive dysfunction Hospitalist service assumed care 6/13 Patient is currently being diuresed under direction of cardiology  Hospital-Problem based course  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--has pacemaker on left side Defer to cardiology Acute superimposed on chronic HFpEF EF 10% grade 3 diastolic dysfunction--estimated 10% Advanced heart failure team has seen and made the following adjustments to meds Current meds:  Coreg 12.5 twice daily, digoxin 0.125 daily, h hydralazine 50 twice daily, Imdur 30 daily,  losartan 50 daily, Aldactone 25 daily Farxiga 10 daily was added Diuresing with IV Lasix  80 twice daily [increased on 6/14] Net I's/O: -1.44 cc Naproxen for pain--no tramadol or opiates Chronic ethanolism drinks 1 handle of vodka daily CIWA scores are negligible at this time-transition in a.m. to low-dose Xanax Phenobarb discontinued by cardiology  He has never attended a 12-step program --appreciate social work input Underlying anxiety Low-dose hydroxyzine was recommended by psychiatry consult on 6/14 Patient however is disinterested in this medication at this time Cut back clonopin to 0.5--would not d/c patient on BZD Hold Remeron / trazodone  2/2 risk qTC prolongation DM TY 2? Blood sugars 90s to 100s eating 100% Get A1c for stratification however possibly does not need insulin on discharge Aortic root dilatation-39 mm on TTE Needs serial outpatient echoes per cardiology Previous homelessness We will go to the home of his mother on discharge   DVT prophylaxis: Heparin Code Status: Full Family Communication: none + Disposition:  Status is: Inpatient  Remains inpatient appropriate because:Hemodynamically unstable, Unsafe d/c plan, and IV treatments appropriate due to intensity of illness or inability to take PO  Dispo: The patient is from: Home              Anticipated d/c is to: Home              Patient currently is not medically stable to d/c.   Difficult to place patient Yes    Consultants:  Cardiology Psychiatry 6/14  Procedures:  Echocardiogram 6/11 = EF 10% grade 3 diastolic restrictive dysfunction mild aortic root dilatation 39 mm  Antimicrobials: None   Subjective:  Awake coherent no distress - Fever, - chills, - nausea, -7 vomiting Ambulatory but weak  Objective: Vitals:   09/08/20 0914 09/08/20 0915 09/08/20 1027 09/08/20 1125  BP: (!) 128/95   Marland Kitchen)  124/97  Pulse: 92 92 98 94  Resp:    (!) 24  Temp:    98.5 F (36.9 C)  TempSrc:    Oral  SpO2:   93% 96%  Weight:      Height:        Intake/Output Summary (Last 24 hours) at  09/08/2020 1457 Last data filed at 09/08/2020 1400 Gross per 24 hour  Intake 770 ml  Output 3200 ml  Net -2430 ml    Filed Weights   09/06/20 0700 09/07/20 0512 09/08/20 0500  Weight: 102.8 kg 104.6 kg 104.8 kg    Examination:  Coherent pleasant no distress JVD present at 30 degrees up to the jaw S1-S2 holosystolic murmur Telemetry some PVCs Chest otherwise clear No lower extremity edema ROM intact moving all 4 limbs equally Missing digits on right hand  Data Reviewed: personally reviewed   CBC    Component Value Date/Time   WBC 7.9 09/08/2020 0043   RBC 4.74 09/08/2020 0043   HGB 13.6 09/08/2020 0043   HCT 40.8 09/08/2020 0043   PLT 148 (L) 09/08/2020 0043   MCV 86.1 09/08/2020 0043   MCH 28.7 09/08/2020 0043   MCHC 33.3 09/08/2020 0043   RDW 17.2 (H) 09/08/2020 0043   LYMPHSABS 3.4 09/08/2020 0043   MONOABS 0.4 09/08/2020 0043   EOSABS 0.2 09/08/2020 0043   BASOSABS 0.0 09/08/2020 0043   CMP Latest Ref Rng & Units 09/08/2020 09/07/2020 09/06/2020  Glucose 70 - 99 mg/dL 93 355(D) 322(G)  BUN 6 - 20 mg/dL 14 13 13   Creatinine 0.61 - 1.24 mg/dL ) 2.54(Y) 7.06(C)  Sodium 135 - 145 mmol/L 136 137 138  Potassium 3.5 - 5.1 mmol/L 4.5 3.6 3.2(L)  Chloride 98 - 111 mmol/L 98 96(L) 95(L)  CO2 22 - 32 mmol/L 29 34(H) 35(H)  Calcium 8.9 - 10.3 mg/dL 8.0(L) 7.5(L) 7.0(L)  Total Protein 6.5 - 8.1 g/dL - 5.3(L) 4.9(L)  Total Bilirubin 0.3 - 1.2 mg/dL - 1.1 3.76(E)  Alkaline Phos 38 - 126 U/L - 166(H) 172(H)  AST 15 - 41 U/L - 65(H) 80(H)  ALT 0 - 44 U/L - 34 32     Radiology Studies: No results found.   Scheduled Meds:  aspirin EC  81 mg Oral Daily   carvedilol  12.5 mg Oral BID WC   dapagliflozin propanediol  10 mg Oral Daily   digoxin  0.125 mg Oral Daily   folic acid  1 mg Oral Daily   furosemide  80 mg Intravenous BID   heparin  5,000 Units Subcutaneous Q8H   hydrALAZINE  50 mg Oral BID   insulin aspart  0-15 Units Subcutaneous Q4H   isosorbide  mononitrate  30 mg Oral Daily   levothyroxine  25 mcg Oral Q0600   losartan  50 mg Oral Daily   multivitamin with minerals  1 tablet Oral Daily   potassium chloride  40 mEq Oral Daily   spironolactone  25 mg Oral Daily   thiamine  100 mg Oral Daily   Continuous Infusions:     LOS: 5 days   Time spent: 64  30, MD [facility] To contact the attending provider between 7A-7P or the covering provider during after hours 7P-7A, please log into the web site [email or web address] and access using universal [place] password for that web site. If you do not have the password, please call the hospital operator.  09/08/2020, 2:57 PM

## 2020-09-08 NOTE — TOC Progression Note (Addendum)
Transition of Care (TOC) - Progression Note  Heart Failure   Patient Details  Name: Darryl Mitchell MRN: [id number] Date of Birth: January 06, 1976  Transition of Care [facility]) CM/SW Contact  Maybelline Kolarik, LCSWA Phone Number: 09/08/2020, 11:43 AM  Clinical Narrative:    CSW spoke with the patient at bedside again today to follow up with Mr. Zinda who reported that his plan is to transition to living in [place]. Mr. Dock reported receiving disability and having Medicare and wants to see what services are available for him here. Mr. Terral reported that his vehicle is back in [place] and CSW spoke with patient about [employer] transportation and Mr. Demuro reported that would be helpful for him. CSW obtained the patient's signature for the rider waiver for [employer] transportation and will enroll him in [employer] transportation and provided Mr. Canizalez with their card for contact information. Mr. Prowse could not provide the CSW with his mother's address here in [place], [place] and gave permission for the CSW to reach out to his mother for the address. CSW spoke with Mr. Blades about establishing a primary care provider here in [place] and provided him with a list of local PCP's however Mr. Palmatier reported he didn't want help scheduling an appointment and that he had to "interview them first" and he would call and make himself an appointment. Mr. Cabiness reported that he would let the CSW know if he needs help.   CSW attempted to contact Zacory Fiola [phone] the patients mother to obtain an address in order to enroll Gilberto in [employer] transportation, however the CSW had to leave a voicemail for her to return the call.  12:53pm - CSW received a call from the patients mother, Steward Drone [phone] who provided her address [address] [place], [place] [address]. Steward Drone reported needing some FMLA paperwork filled out by her son's doctor/cardiologist and reported she will help get him scheduled with a primary care doctor.  CSW will continue to follow throughout  discharge.   Expected Discharge Plan: Home/Self Care Barriers to Discharge: Continued Medical Work up  Expected Discharge Plan and Services Expected Discharge Plan: Home/Self Care In-house Referral: Clinical Social Work Discharge Planning Services: CM Consult   Living arrangements for the past 2 months: No permanent address (Patient reports he is staying with his mother in [place], [place])                                       Social Determinants of Health (SDOH) Interventions Food Insecurity Interventions: Intervention Not Indicated Financial Strain Interventions: Other (Comment) (CSW encouraged pt. to call the number listed on his Medicare card to see about services in [place]) Housing Interventions: Intervention Not Indicated, Other (Comment) (Patient reports he is currently staying with his mother in [place] and his sister is here as well.) Transportation Interventions: Retail banker  Readmission Risk Interventions No flowsheet data found.  Achille Xiang, MSW, LCSWA [phone] Heart Failure Social Worker

## 2020-09-08 NOTE — Plan of Care (Signed)
  Problem: Activity: Goal: Risk for activity intolerance will decrease Outcome: Progressing   Problem: Education: Goal: Knowledge of General Education information will improve Description: Including pain rating scale, medication(s)/side effects and non-pharmacologic comfort measures Outcome: Progressing   Problem: Health Behavior/Discharge Planning: Goal: Ability to manage health-related needs will improve Outcome: Progressing   Problem: Clinical Measurements: Goal: Ability to maintain clinical measurements within normal limits will improve Outcome: Progressing Goal: Will remain free from infection Outcome: Progressing Goal: Diagnostic test results will improve Outcome: Progressing Goal: Respiratory complications will improve Outcome: Progressing Goal: Cardiovascular complication will be avoided Outcome: Progressing   Problem: Activity: Goal: Risk for activity intolerance will decrease Outcome: Progressing   Problem: Nutrition: Goal: Adequate nutrition will be maintained Outcome: Progressing   Problem: Coping: Goal: Level of anxiety will decrease Outcome: Progressing   Problem: Elimination: Goal: Will not experience complications related to bowel motility Outcome: Progressing Goal: Will not experience complications related to urinary retention Outcome: Progressing   Problem: Pain Managment: Goal: General experience of comfort will improve Outcome: Progressing   Problem: Safety: Goal: Ability to remain free from injury will improve Outcome: Progressing   Problem: Skin Integrity: Goal: Risk for impaired skin integrity will decrease Outcome: Progressing   Problem: Education: Goal: Ability to demonstrate management of disease process will improve Outcome: Progressing Goal: Ability to verbalize understanding of medication therapies will improve Outcome: Progressing Goal: Individualized Educational Video(s) Outcome: Progressing   Problem: Activity: Goal:  Capacity to carry out activities will improve Outcome: Progressing   Problem: Cardiac: Goal: Ability to achieve and maintain adequate cardiopulmonary perfusion will improve Outcome: Progressing   

## 2020-09-08 NOTE — Progress Notes (Addendum)
Advanced Heart Failure Rounding Note  PCP-Cardiologist: None   Subjective:    Yesterday diuresed with IV lasix x2. HF meds adjusted.   Feels ok. Denies SOB but fatigued after walking.    Objective:   Weight Range: 104.8 kg Body mass index is 31.33 kg/m.   Vital Signs:   Temp:  [98 F (36.7 C)-98.7 F (37.1 C)] 98 F (36.7 C) (06/14 0713) Pulse Rate:  [88-96] 92 (06/14 0713) Resp:  [15-20] 18 (06/14 0713) BP: (113-132)/(79-104) 128/95 (06/14 0713) SpO2:  [94 %-99 %] 94 % (06/14 0713) Weight:  [104.8 kg] 104.8 kg (06/14 0500) Last BM Date: 09/06/20  Weight change: Filed Weights   09/06/20 0700 09/07/20 0512 09/08/20 0500  Weight: 102.8 kg 104.6 kg 104.8 kg    Intake/Output:   Intake/Output Summary (Last 24 hours) at 09/08/2020 0717 Last data filed at 09/07/2020 2100 Gross per 24 hour  Intake 764 ml  Output 1725 ml  Net -961 ml      Physical Exam    General:  Sitting on the side of the bed. No resp difficulty HEENT: Normal Neck: Supple. JVP .8-9 Carotids 2+ bilat; no bruits. No lymphadenopathy or thyromegaly appreciated. Cor: PMI nondisplaced. Regular rate & rhythm. No rubs, gallops or murmurs. Lungs: Clear Abdomen: Soft, nontender, nondistended. No hepatosplenomegaly. No bruits or masses. Good bowel sounds. Extremities: No cyanosis, clubbing, rash, edema Neuro: Alert & orientedx3, cranial nerves grossly intact. moves all 4 extremities w/o difficulty. Affect pleasant   Telemetry   SR 80-90s . Personally reviewed.   EKG    N/A Labs    CBC Recent Labs    09/06/20 0029 09/08/20 0043  WBC 7.6 7.9  NEUTROABS  --  3.9  HGB 12.8* 13.6  HCT 37.3* 40.8  MCV 82.9 86.1  PLT 117* 148*   Basic Metabolic Panel Recent Labs    44/62/86 0029 09/07/20 0033 09/08/20 0043  NA 138 137 136  K 3.2* 3.6 4.5  CL 95* 96* 98  CO2 35* 34* 29  GLUCOSE 142* 144* 93  BUN 13 13 14   CREATININE 1.35* 1.29* 1.35*  CALCIUM 7.0* 7.5* 8.0*  MG 2.2 2.0 2.1   PHOS 3.7 2.5  --    Liver Function Tests Recent Labs    09/06/20 0029 09/07/20 0033  AST 80* 65*  ALT 32 34  ALKPHOS 172* 166*  BILITOT 1.3* 1.1  PROT 4.9* 5.3*  ALBUMIN 2.5* 2.8*   No results for input(s): LIPASE, AMYLASE in the last 72 hours. Cardiac Enzymes No results for input(s): CKTOTAL, CKMB, CKMBINDEX, TROPONINI in the last 72 hours.  BNP: BNP (last 3 results) No results for input(s): BNP in the last 8760 hours.  ProBNP (last 3 results) No results for input(s): PROBNP in the last 8760 hours.   D-Dimer No results for input(s): DDIMER in the last 72 hours. Hemoglobin A1C No results for input(s): HGBA1C in the last 72 hours. Fasting Lipid Panel No results for input(s): CHOL, HDL, LDLCALC, TRIG, CHOLHDL, LDLDIRECT in the last 72 hours. Thyroid Function Tests No results for input(s): TSH, T4TOTAL, T3FREE, THYROIDAB in the last 72 hours.  Invalid input(s): FREET3  Other results:   Imaging    No results found.   Medications:     Scheduled Medications:  aspirin EC  81 mg Oral Daily   carvedilol  12.5 mg Oral BID WC   dapagliflozin propanediol  10 mg Oral Daily   digoxin  0.125 mg Oral Daily   folic acid  1 mg Oral Daily   heparin  5,000 Units Subcutaneous Q8H   hydrALAZINE  25 mg Oral BID   insulin aspart  0-15 Units Subcutaneous Q4H   isosorbide mononitrate  30 mg Oral Daily   levothyroxine  25 mcg Oral Q0600   losartan  50 mg Oral Daily   multivitamin with minerals  1 tablet Oral Daily   potassium chloride  40 mEq Oral Daily   spironolactone  25 mg Oral Daily   thiamine  100 mg Oral Daily    Infusions:   PRN Medications: benzonatate, clonazePAM, docusate sodium, ibuprofen, melatonin, polyethylene glycol     Assessment/Plan  VT/VF Multiple shocks for VT/VF in the setting ETOH abuse.  K and Mag replaced. Will need to keep K >4 and Mag >2. Stable today  EP following. Off amio with QT prolongation. Has [employer] ICD Discussed  no driving 6 months.    2. A/C Biventricular Heart Failure, presume NICM--> ETOH  ECHO EF 10% with severe RV failure back in 2021 EF was up to 40-45% - Yesterday had 2 doses on IV lasix. Weight up 1 pound. He has lots of liquids on his tray. Discussed limiting fluid intake. Will check Reds Clip.  - No entresto with h/o angioedema with lisinopril - Continue current dose of carvedilol - Increase hydralazine to 50 mg twice a day.  - Continue imdur 30 mg daiy - Continue spiro 25 mg daily - Continue  farxiga 10 mg daily - Continue  digoxin 0.125 mg daily - Renal function stable.  - Cardiac Rehab appreciated.    3. ETOH Abuse -Off phenobarbital today. On thiamine. -HF SW appreciated. Resource provided.    4. HTN  -Elevated on admit.  - Remains elevated. Increase hydralazine to 50 mg twice a day.    5. Noncompliance No recent home medications.   6. DMII Hgb AiC 6.6 Continue farxiga 10 mg daily.    7. Tobacco Abuse Discussed smoking cessation.  Social - he was homeless prior to moving down to [place] but now living with his Mom. HF SW appreciated. Will need all meds prior to discharge.   Check Reds Clip.   Length of Stay: 5  Amy Clegg, NP  09/08/2020, 7:17 AM  Advanced Heart Failure Team Pager [phone] (M-F; 7a - 5p)  Please contact [facility] for night-coverage after hours (5p -7a ) and weekends on [email or web address]  Patient seen with NP, agree with the above note.   Fatigue with walking.  REDS clip 46% today.   General: NAD Neck: JVP 12+ cm, no thyromegaly or thyroid nodule.  Lungs: Clear to auscultation bilaterally with normal respiratory effort. CV: Nondisplaced PMI.  Heart regular S1/S2, no S3/S4, no murmur.  1+ ankle edema.  Abdomen: Soft, nontender, no hepatosplenomegaly, no distention.  Skin: Intact without lesions or rashes.  Neurologic: Alert and oriented x 3.  Psych: Normal affect. Extremities: No clubbing or cyanosis.  HEENT: Normal.   1. VT/VF: Multiple shocks  in setting of ETOH binge with low K and Mg.  Has [employer] ICD.  Electrolytes replaced.  No further VT.  HS-TnI mildly elevated without trend. - Continue Coreg 12.5 mg bid. - Not on amiodarone with initial markedly prolonged QT interval. - Ischemic unlikely, has long history of NICM.   2. Acute on chronic systolic CHF: Biventricular failure, followed in [place] in the past.  Echoes have shown EF up and down, appears to fluctuate with medication compliance and likely with ETOH intake. Echo this admission  with EF 10%, severe LV dilation, severely reduced RV systolic function, mild MR.  Reports prior cath without CAD though I cannot find report.  He had a normal nuclear stress test in the past as well. Cardiomyopathy is likely due to heavy ETOH abuse (2 gallons/day vodka currently) and possibly poor HTN control.  He remains volume overloaded on exam with REDS clip 46%. - Lasix 80 mg IV bid today. - No ACEI or ARNI due to angioedema with ACEI. - Continue losartan 50 mg daily. - Continue Coreg 12.5 mg bid. - Increase hydralazine to 50 bid and continue Imdur 30. - Spironolactone 25 daily. - Continue Farxiga 10 mg daily. - Continue digoxin 0.125 daily. - Imperative to stop ETOH. 3. ETOH abuse: Has finished phenobarbital for withdrawal. - Strongly urged to quit. 4. HTN: BP control with the above meds. 5. Smoking: Enouraged to quit.   Marca Ancona 09/08/2020 9:08 AM

## 2020-09-08 NOTE — Plan of Care (Signed)
  Problem: Activity: Goal: Risk for activity intolerance will decrease Outcome: Progressing   Problem: Activity: Goal: Risk for activity intolerance will decrease Outcome: Progressing   Problem: Nutrition: Goal: Adequate nutrition will be maintained Outcome: Progressing   Problem: Coping: Goal: Level of anxiety will decrease Outcome: Progressing

## 2020-09-08 NOTE — Progress Notes (Signed)
CARDIAC REHAB PHASE I   PRE:  Rate/Rhythm: 87 SR    BP: sitting 122/99    SaO2: 97 RA  MODE:  Ambulation: 400 ft   POST:  Rate/Rhythm: 100 ST    BP: sitting 128/107     SaO2: 96 RA  Pt stronger today. Quicker pace, steadier. No major c/o walking. Brief discussion of HF booklet. Pt able to voice watching for fluid using scales. Pt sts he plans to focus on ETOH cessation however it does not sound like he has a concrete plan for this at this time. Encouraged him to utilize resources.  [phone]   Darryl Mitchell CES, ACSM 09/08/2020 1:45 PM

## 2020-09-08 NOTE — Evaluation (Signed)
Occupational Therapy Evaluation Patient Details Name: Darryl Mitchell MRN: [id number] DOB: 02-05-1976 Today's Date: 09/08/2020    History of Present Illness 44 yo admitted 6/9 with seizure like activity with torsades de pointes s/p defibrillation x 7. PMhx: ETOH abuse, HTN, CHf s/p AICD, NICM, homelessness   Clinical Impression   PTA patient independent, admitted for above and presenting with problem list below including generalized weakness, impaired balance and decreased activity tolerance.  Pt presenting cognitively with slow processing and decreased problem solving, but anticipate near baseline.  He currently requires min guard fading to close supervision for transfers and in room mobility, ADLs with up to min guard assist.  He will benefit from further OT services while admitted to optimize independence and safety with ADLs, IADLs and mobility but anticipate no further needs after dc home.     Follow Up Recommendations  No OT follow up;Supervision - Intermittent    Equipment Recommendations  None recommended by OT    Recommendations for Other Services       Precautions / Restrictions Precautions Precautions: Fall Restrictions Weight Bearing Restrictions: No      Mobility Bed Mobility Overal bed mobility: Modified Independent             General bed mobility comments: HOB elevated but not assist required    Transfers Overall transfer level: Needs assistance Equipment used: None Transfers: Sit to/from Stand Sit to Stand: Min guard;Supervision         General transfer comment: min guard initally fading to supervision    Balance Overall balance assessment: Mild deficits observed, not formally tested                                         ADL either performed or assessed with clinical judgement   ADL Overall ADL's : Needs assistance/impaired     Grooming: Supervision/safety;Standing   Upper Body Bathing: Set up;Sitting   Lower Body  Bathing: Sit to/from stand;Min guard   Upper Body Dressing : Set up;Sitting   Lower Body Dressing: Min guard;Sit to/from stand   Toilet Transfer: Min guard;Ambulation           Functional mobility during ADLs: Min guard;Cueing for safety General ADL Comments: pt limited by impaired balance, decreased activity tolerance, generalized weakness     Vision   Vision Assessment?: No apparent visual deficits     Perception     Praxis      Pertinent Vitals/Pain Pain Assessment: Faces Pain Score: 3  Pain Location: L side Pain Descriptors / Indicators: Discomfort Pain Intervention(s): Monitored during session     Hand Dominance Left   Extremity/Trunk Assessment Upper Extremity Assessment Upper Extremity Assessment: Generalized weakness (R hand congenital deformity)   Lower Extremity Assessment Lower Extremity Assessment: Defer to PT evaluation   Cervical / Trunk Assessment Cervical / Trunk Assessment: Normal   Communication Communication Communication: No difficulties   Cognition Arousal/Alertness: Awake/alert Behavior During Therapy: Flat affect Overall Cognitive Status: No family/caregiver present to determine baseline cognitive functioning Area of Impairment: Problem solving                             Problem Solving: Slow processing;Difficulty sequencing;Requires verbal cues General Comments: pt with slow processing, requires increased time to problem solve with decreased ability to sequence months backwards but this maybe baseline   General Comments  VSS on RA, SpO2 ranged from 90-96% HR 95-105 during activity    Exercises     Shoulder Instructions      Home Living Family/patient expects to be discharged to:: Private residence Living Arrangements: Parent Available Help at Discharge: Family Type of Home: House Home Access: Stairs to enter Secretary/administrator of Steps: 3 Entrance Stairs-Rails: None Home Layout: Two level;Able to live on  main level with bedroom/bathroom     Bathroom Shower/Tub:  (pt unsure)         Home Equipment: None          Prior Functioning/Environment Level of Independence: Independent        Comments: independent and driving, working freelance        OT Problem List: Decreased strength;Decreased activity tolerance;Impaired balance (sitting and/or standing);Decreased safety awareness;Decreased knowledge of use of DME or AE;Decreased knowledge of precautions;Decreased cognition      OT Treatment/Interventions: Self-care/ADL training;Therapeutic exercise;DME and/or AE instruction;Therapeutic activities;Cognitive remediation/compensation;Balance training;Patient/family education    OT Goals(Current goals can be found in the care plan section) Acute Rehab OT Goals Patient Stated Goal: home OT Goal Formulation: With patient Time For Goal Achievement: 09/22/20 Potential to Achieve Goals: Good  OT Frequency: Min 2X/week   Barriers to D/C:            Co-evaluation              AM-PAC OT "6 Clicks" Daily Activity     Outcome Measure Help from another person eating meals?: None Help from another person taking care of personal grooming?: A Little Help from another person toileting, which includes using toliet, bedpan, or urinal?: A Little Help from another person bathing (including washing, rinsing, drying)?: A Little Help from another person to put on and taking off regular upper body clothing?: A Little Help from another person to put on and taking off regular lower body clothing?: A Little 6 Click Score: 19   End of Session Nurse Communication: Mobility status  Activity Tolerance: Patient tolerated treatment well Patient left: in bed;with bed alarm set;with call bell/phone within reach  OT Visit Diagnosis: Other abnormalities of gait and mobility (R26.89);Muscle weakness (generalized) (M62.81)                Time: 7408-1448 OT Time Calculation (min): 22 min Charges:  OT  General Charges $OT Visit: 1 Visit OT Evaluation $OT Eval Moderate Complexity: 1 Mod  [name], OT Acute Rehabilitation Services Pager [phone] Office [phone]    [name] 09/08/2020, 9:07 AM

## 2020-09-08 NOTE — Consult Note (Signed)
[facility] Face-to-Face Psychiatry Consult   Reason for Consult:  Anxiety medications Referring Physician:  Pleas Koch, MD Patient Identification: Darryl Mitchell MRN:  [id number] Principal Diagnosis: <principal problem not specified> Diagnosis:  Active Problems:   Torsades de pointes (HCC)   Total Time spent with patient: 20 minutes  Subjective:   Darryl Mitchell is a 45 y.o. male patient admitted with torsades has a PMH of EtoH use disorder, HTN, CHF s/p AICD.  HPI:  On assessment this AM patient reports that he has some anxiety at times, but is not able to recall how often or if there are instances that lead to him being anxious. Patient reports that it is difficult to report how he feels when he has these episodes of anxiety and the best he can say is "it's like a flight or fight." Patient reports that he thinks that one of his reasons for drinking may be due to his anxiety, but he is not really interested in taking anything for his anxiety or EtoH cessation. Patient reports that he has had therapy before mostly in detox or rehab facilities and he does not feel that it works. Patient does not endorse SI, HI, nor AVH.  Past Psychiatric History: EtOH use disorder  Risk to Self: NO  Risk to Others:  NO Prior Inpatient Therapy:  Rehab, unknown if prior psychiatric care Prior Outpatient Therapy:  Reports some OP therapy as well   Past Medical History:  Past Medical History:  Diagnosis Date   CHF (congestive heart failure) (HCC)    Hypertension     Past Surgical History:  Procedure Laterality Date   ICD IMPLANT     Family History: History reviewed. No pertinent family history. Family Psychiatric  History: None known Social History:  Social History   Substance and Sexual Activity  Alcohol Use Yes   Comment: 2 gallons of Vodka     Social History   Substance and Sexual Activity  Drug Use Not on file    Social History   Socioeconomic History   Marital status: Divorced    Spouse name: Not  on file   Number of children: Not on file   Years of education: Not on file   Highest education level: Not on file  Occupational History   Not on file  Tobacco Use   Smoking status: Every Day    Packs/day: 3.00    Pack years: 0.00    Types: Cigarettes   Smokeless tobacco: Current  Substance and Sexual Activity   Alcohol use: Yes    Comment: 2 gallons of Vodka   Drug use: Not on file   Sexual activity: Not on file  Other Topics Concern   Not on file  Social History Narrative   Not on file   Social Determinants of Health   Financial Resource Strain: Medium Risk   Difficulty of Paying Living Expenses: Somewhat hard  Food Insecurity: No Food Insecurity   Worried About Programme researcher, broadcasting/film/video in the Last Year: Never true   Ran Out of Food in the Last Year: Never true  Transportation Needs: Unmet Transportation Needs   Lack of Transportation (Medical): Yes   Lack of Transportation (Non-Medical): Yes  Physical Activity: Not on file  Stress: Not on file  Social Connections: Not on file   Additional Social History:    Allergies:   Allergies  Allergen Reactions   Lisinopril Swelling    Labs:  Results for orders placed or performed during the hospital encounter of  09/03/20 (from the past 48 hour(s))  Glucose, capillary     Status: Abnormal   Collection Time: 09/06/20  3:44 PM  Result Value Ref Range   Glucose-Capillary 160 (H) 70 - 99 mg/dL    Comment: Glucose reference range applies only to samples taken after fasting for at least 8 hours.  Glucose, capillary     Status: Abnormal   Collection Time: 09/06/20  9:07 PM  Result Value Ref Range   Glucose-Capillary 116 (H) 70 - 99 mg/dL    Comment: Glucose reference range applies only to samples taken after fasting for at least 8 hours.  Phosphorus     Status: None   Collection Time: 09/07/20 12:33 AM  Result Value Ref Range   Phosphorus 2.5 2.5 - 4.6 mg/dL    Comment: Performed at [facility], [address],  [place], [place] [address]  Comprehensive metabolic panel     Status: Abnormal   Collection Time: 09/07/20 12:33 AM  Result Value Ref Range   Sodium 137 135 - 145 mmol/L   Potassium 3.6 3.5 - 5.1 mmol/L   Chloride 96 (L) 98 - 111 mmol/L   CO2 34 (H) 22 - 32 mmol/L   Glucose, Bld 144 (H) 70 - 99 mg/dL    Comment: Glucose reference range applies only to samples taken after fasting for at least 8 hours.   BUN 13 6 - 20 mg/dL   Creatinine, Ser 2.53 (H) 0.61 - 1.24 mg/dL   Calcium 7.5 (L) 8.9 - 10.3 mg/dL   Total Protein 5.3 (L) 6.5 - 8.1 g/dL   Albumin 2.8 (L) 3.5 - 5.0 g/dL   AST 65 (H) 15 - 41 U/L   ALT 34 0 - 44 U/L   Alkaline Phosphatase 166 (H) 38 - 126 U/L   Total Bilirubin 1.1 0.3 - 1.2 mg/dL   GFR, Estimated >66 >44 mL/min    Comment: (NOTE) Calculated using the CKD-EPI Creatinine Equation (2021)    Anion gap 7 5 - 15    Comment: Performed at [facility], [address] [place], [place] [address]  Magnesium     Status: None   Collection Time: 09/07/20 12:33 AM  Result Value Ref Range   Magnesium 2.0 1.7 - 2.4 mg/dL    Comment: Performed at [facility], [address] [place], [place] [address]  Glucose, capillary     Status: Abnormal   Collection Time: 09/07/20  6:42 AM  Result Value Ref Range   Glucose-Capillary 111 (H) 70 - 99 mg/dL    Comment: Glucose reference range applies only to samples taken after fasting for at least 8 hours.  Glucose, capillary     Status: Abnormal   Collection Time: 09/07/20 11:30 AM  Result Value Ref Range   Glucose-Capillary 137 (H) 70 - 99 mg/dL    Comment: Glucose reference range applies only to samples taken after fasting for at least 8 hours.  Glucose, capillary     Status: Abnormal   Collection Time: 09/07/20  4:34 PM  Result Value Ref Range   Glucose-Capillary 123 (H) 70 - 99 mg/dL    Comment: Glucose reference range applies only to samples taken after fasting for at least 8 hours.  Glucose, capillary     Status: None    Collection Time: 09/07/20  9:27 PM  Result Value Ref Range   Glucose-Capillary 75 70 - 99 mg/dL    Comment: Glucose reference range applies only to samples  taken after fasting for at least 8 hours.  Basic metabolic panel     Status: Abnormal   Collection Time: 09/08/20 12:43 AM  Result Value Ref Range   Sodium 136 135 - 145 mmol/L   Potassium 4.5 3.5 - 5.1 mmol/L    Comment: DELTA CHECK NOTED NO VISIBLE HEMOLYSIS    Chloride 98 98 - 111 mmol/L   CO2 29 22 - 32 mmol/L   Glucose, Bld 93 70 - 99 mg/dL    Comment: Glucose reference range applies only to samples taken after fasting for at least 8 hours.   BUN 14 6 - 20 mg/dL   Creatinine, Ser 4.09 (H) 0.61 - 1.24 mg/dL   Calcium 8.0 (L) 8.9 - 10.3 mg/dL   GFR, Estimated >81 >19 mL/min    Comment: (NOTE) Calculated using the CKD-EPI Creatinine Equation (2021)    Anion gap 9 5 - 15    Comment: Performed at [facility], [address] [place], [place] [address]  Magnesium     Status: None   Collection Time: 09/08/20 12:43 AM  Result Value Ref Range   Magnesium 2.1 1.7 - 2.4 mg/dL    Comment: Performed at [facility], [address], [place], [place] [address]  CBC with Differential/Platelet     Status: Abnormal   Collection Time: 09/08/20 12:43 AM  Result Value Ref Range   WBC 7.9 4.0 - 10.5 K/uL   RBC 4.74 4.22 - 5.81 MIL/uL   Hemoglobin 13.6 13.0 - 17.0 g/dL   HCT 30.8 65.7 - 84.6 %   MCV 86.1 80.0 - 100.0 fL   MCH 28.7 26.0 - 34.0 pg   MCHC 33.3 30.0 - 36.0 g/dL   RDW 96.2 (H) 95.2 - 84.1 %   Platelets 148 (L) 150 - 400 K/uL   nRBC 0.5 (H) 0.0 - 0.2 %   Neutrophils Relative % 50 %   Neutro Abs 3.9 1.7 - 7.7 K/uL   Lymphocytes Relative 42 %   Lymphs Abs 3.4 0.7 - 4.0 K/uL   Monocytes Relative 6 %   Monocytes Absolute 0.4 0.1 - 1.0 K/uL   Eosinophils Relative 2 %   Eosinophils Absolute 0.2 0.0 - 0.5 K/uL   Basophils Relative 0 %   Basophils Absolute 0.0 0.0 - 0.1 K/uL   Immature Granulocytes 0 %   Abs  Immature Granulocytes 0.02 0.00 - 0.07 K/uL    Comment: Performed at [facility], [address], [place], [place] [address]  Glucose, capillary     Status: Abnormal   Collection Time: 09/08/20  6:09 AM  Result Value Ref Range   Glucose-Capillary 108 (H) 70 - 99 mg/dL    Comment: Glucose reference range applies only to samples taken after fasting for at least 8 hours.  Glucose, capillary     Status: None   Collection Time: 09/08/20 11:23 AM  Result Value Ref Range   Glucose-Capillary 93 70 - 99 mg/dL    Comment: Glucose reference range applies only to samples taken after fasting for at least 8 hours.    Current Facility-Administered Medications  Medication Dose Route Frequency Provider Last Rate Last Admin   aspirin EC tablet 81 mg  81 mg Oral Daily Mannam, Praveen, MD   81 mg at 09/08/20 0914   benzonatate (TESSALON) capsule 100 mg  100 mg Oral TID PRN John Giovanni, MD   100 mg at 09/08/20 0336   carvedilol (COREG) tablet 12.5 mg  12.5 mg Oral BID WC Mannam, Praveen, MD   12.5 mg at 09/08/20 0914   clonazePAM (KLONOPIN) tablet 1 mg  1 mg Oral BID PRN Mannam, Praveen, MD   1 mg at 09/07/20 2129   dapagliflozin propanediol (FARXIGA) tablet 10 mg  10 mg Oral Daily Laurey Morale, MD   10 mg at 09/08/20 3785   digoxin (LANOXIN) tablet 0.125 mg  0.125 mg Oral Daily Laurey Morale, MD   0.125 mg at 09/08/20 0915   docusate sodium (COLACE) capsule 100 mg  100 mg Oral BID PRN Mannam, Colbert Coyer, MD       folic acid (FOLVITE) tablet 1 mg  1 mg Oral Daily Mannam, Praveen, MD   1 mg at 09/08/20 0914   furosemide (LASIX) injection 80 mg  80 mg Intravenous BID Laurey Morale, MD   80 mg at 09/08/20 0919   heparin injection 5,000 Units  5,000 Units Subcutaneous Q8H Mannam, Praveen, MD   5,000 Units at 09/07/20 2128   hydrALAZINE (APRESOLINE) tablet 50 mg  50 mg Oral BID Clegg, Amy D, NP   50 mg at 09/08/20 8850   ibuprofen (ADVIL) tablet 400 mg  400 mg Oral Q6H PRN Rhetta Mura,  MD       insulin aspart (novoLOG) injection 0-15 Units  0-15 Units Subcutaneous Q4H Mannam, Praveen, MD   3 Units at 09/05/20 1230   isosorbide mononitrate (IMDUR) 24 hr tablet 30 mg  30 mg Oral Daily Laurey Morale, MD   30 mg at 09/08/20 0915   levothyroxine (SYNTHROID) tablet 25 mcg  25 mcg Oral Q0600 Chilton Greathouse, MD   25 mcg at 09/08/20 0609   losartan (COZAAR) tablet 50 mg  50 mg Oral Daily Laurey Morale, MD   50 mg at 09/08/20 0915   melatonin tablet 5 mg  5 mg Oral QHS PRN John Giovanni, MD   5 mg at 09/08/20 2774   multivitamin with minerals tablet 1 tablet  1 tablet Oral Daily Mannam, Praveen, MD   1 tablet at 09/08/20 0915   polyethylene glycol (MIRALAX / GLYCOLAX) packet 17 g  17 g Oral Daily PRN Mannam, Praveen, MD       potassium chloride SA (KLOR-CON) CR tablet 40 mEq  40 mEq Oral Daily Allred, Fayrene Fearing, MD   40 mEq at 09/08/20 0915   spironolactone (ALDACTONE) tablet 25 mg  25 mg Oral Daily Allred, Fayrene Fearing, MD   25 mg at 09/08/20 0915   thiamine tablet 100 mg  100 mg Oral Daily Mannam, Praveen, MD   100 mg at 09/08/20 0915    Musculoskeletal: Strength & Muscle Tone: within normal limits Gait & Station: normal Patient leans: N/A            Psychiatric Specialty Exam:  Presentation  General Appearance: Appropriate for Environment  Eye Contact:Good  Speech:Clear and Coherent  Speech Volume:Normal  Handedness: No data recorded  Mood and Affect  Mood:Euthymic  Affect:Congruent   Thought Process  Thought Processes:Goal Directed (mild thought blocking)  Descriptions of Associations:Circumstantial  Orientation:Full (Time, Place and Person)  Thought Content:Logical  History of Schizophrenia/Schizoaffective disorder:No data recorded Duration of Psychotic Symptoms:No data recorded Hallucinations:Hallucinations: None  Ideas of Reference:None  Suicidal Thoughts:Suicidal Thoughts: No  Homicidal Thoughts:Homicidal Thoughts: No   Sensorium   Memory:Immediate Good; Recent Good; Remote Fair  Judgment:Poor  Insight:Shallow   Executive Functions  Concentration:Poor  Attention Span:Fair  Recall:Fair  Fund of Knowledge:Fair  Language:Fair   Psychomotor Activity  Psychomotor Activity:Psychomotor Activity: Normal   Assets  Assets:Resilience   Sleep  Sleep:Sleep: Fair   Physical Exam: Physical Exam Constitutional:      Appearance: Normal appearance.  HENT:     Head: Normocephalic and atraumatic.  Eyes:     Extraocular Movements: Extraocular movements intact.     Conjunctiva/sclera: Conjunctivae normal.  Cardiovascular:     Rate and Rhythm: Normal rate.  Pulmonary:     Effort: Pulmonary effort is normal.     Breath sounds: Normal breath sounds.  Abdominal:     General: Abdomen is flat.  Musculoskeletal:        General: Normal range of motion.  Skin:    General: Skin is warm and dry.  Neurological:     General: No focal deficit present.     Mental Status: He is alert.   Review of Systems  Constitutional:  Negative for chills and fever.  HENT:  Negative for hearing loss.   Eyes:  Negative for blurred vision.  Respiratory:  Negative for cough and wheezing.   Cardiovascular:  Negative for chest pain.  Gastrointestinal:  Negative for abdominal pain.  Neurological:  Negative for dizziness.  Psychiatric/Behavioral:  Negative for suicidal ideas.   Blood pressure (!) 124/97, pulse 94, temperature 98.5 F (36.9 C), temperature source Oral, resp. rate (!) 24, height 6' (1.829 m), weight 104.8 kg, SpO2 96 %. Body mass index is 31.33 kg/m.  Treatment Plan Summary: EtOH use disorder Patient does not wish to receive any medication for his anxiety and [facility] despite being aware that it is negatively impacting his health.   Due to patient's Torsades he is not a candidate to take SSRIs and his EtOH use disorder also decreases the safety of using benzo's PRN for anxiety.  Patient's remeron was stopped due to concern that it could be contributing to his torsades in conjunction with his EtoH use.  - Recommend Atarax 12.5mg  TID PRN but patient indicates he would not really wish to take medication at this time    Disposition:  Per primary team.  PGY-1 Bobbye MortonJai B Ariel Dimitri, MD 09/08/2020 12:29 PM

## 2020-09-08 NOTE — Progress Notes (Signed)
   09/08/20 1100  Clinical Encounter Type  Visited With Patient  Visit Type Initial  Referral From Nurse  Consult/Referral To Chaplain  The chaplain responded to consult for an advance directive. The unit secretary provided the patient with the paperwork. The chaplain will follow up to answer any questions and provide education as needed.

## 2020-09-08 NOTE — Progress Notes (Signed)
REDS Clip  READING= 46%  Sherron Monday, PharmD, BCCCP Clinical Pharmacist  Phone: [phone] 09/08/2020 9:12 AM  Please check AMION for all [facility] phone numbers After 10:00 PM, call Main Pharmacy [phone]

## 2020-09-09 DIAGNOSIS — F1020 Alcohol dependence, uncomplicated: Secondary | ICD-10-CM | POA: Diagnosis present

## 2020-09-09 DIAGNOSIS — I428 Other cardiomyopathies: Secondary | ICD-10-CM

## 2020-09-09 DIAGNOSIS — I5043 Acute on chronic combined systolic (congestive) and diastolic (congestive) heart failure: Secondary | ICD-10-CM

## 2020-09-09 DIAGNOSIS — F411 Generalized anxiety disorder: Secondary | ICD-10-CM | POA: Diagnosis present

## 2020-09-09 DIAGNOSIS — I5082 Biventricular heart failure: Secondary | ICD-10-CM | POA: Diagnosis present

## 2020-09-09 LAB — BASIC METABOLIC PANEL
Anion gap: 8 (ref 5–15)
BUN: 11 mg/dL (ref 6–20)
CO2: 31 mmol/L (ref 22–32)
Calcium: 8.2 mg/dL — ABNORMAL LOW (ref 8.9–10.3)
Chloride: 98 mmol/L (ref 98–111)
Creatinine, Ser: 1.46 mg/dL — ABNORMAL HIGH (ref 0.61–1.24)
GFR, Estimated: >60 mL/min (ref >60)
Glucose, Bld: 184 mg/dL — ABNORMAL HIGH (ref 70–99)
Potassium: 3.9 mmol/L (ref 3.5–5.1)
Sodium: 137 mmol/L (ref 135–145)

## 2020-09-09 LAB — MAGNESIUM: Magnesium: 1.8 mg/dL (ref 1.7–2.4)

## 2020-09-09 LAB — CBC WITH DIFFERENTIAL/PLATELET
Abs Immature Granulocytes: 0.02 10*3/uL (ref 0.00–0.07)
Basophils Absolute: 0 10*3/uL (ref 0.0–0.1)
Basophils Relative: 0 %
Eosinophils Absolute: 0.2 10*3/uL (ref 0.0–0.5)
Eosinophils Relative: 3 %
HCT: 39.4 % (ref 39.0–52.0)
Hemoglobin: 12.8 g/dL — ABNORMAL LOW (ref 13.0–17.0)
Immature Granulocytes: 0 %
Lymphocytes Relative: 41 %
Lymphs Abs: 3 10*3/uL (ref 0.7–4.0)
MCH: 28.1 pg (ref 26.0–34.0)
MCHC: 32.5 g/dL (ref 30.0–36.0)
MCV: 86.4 fL (ref 80.0–100.0)
Monocytes Absolute: 0.7 10*3/uL (ref 0.1–1.0)
Monocytes Relative: 10 %
Neutro Abs: 3.4 10*3/uL (ref 1.7–7.7)
Neutrophils Relative %: 46 %
Platelets: 160 10*3/uL (ref 150–400)
RBC: 4.56 MIL/uL (ref 4.22–5.81)
RDW: 17.2 % — ABNORMAL HIGH (ref 11.5–15.5)
WBC: 7.4 10*3/uL (ref 4.0–10.5)
nRBC: 0.3 % — ABNORMAL HIGH (ref 0.0–0.2)

## 2020-09-09 LAB — COMPREHENSIVE METABOLIC PANEL
ALT: 33 U/L (ref 0–44)
AST: 43 U/L — ABNORMAL HIGH (ref 15–41)
Albumin: 2.8 g/dL — ABNORMAL LOW (ref 3.5–5.0)
Alkaline Phosphatase: 141 U/L — ABNORMAL HIGH (ref 38–126)
Anion gap: 9 (ref 5–15)
BUN: 11 mg/dL (ref 6–20)
CO2: 35 mmol/L — ABNORMAL HIGH (ref 22–32)
Calcium: 8.4 mg/dL — ABNORMAL LOW (ref 8.9–10.3)
Chloride: 97 mmol/L — ABNORMAL LOW (ref 98–111)
Creatinine, Ser: 1.47 mg/dL — ABNORMAL HIGH (ref 0.61–1.24)
GFR, Estimated: 60 mL/min — ABNORMAL LOW (ref >60)
Glucose, Bld: 96 mg/dL (ref 70–99)
Potassium: 3.8 mmol/L (ref 3.5–5.1)
Sodium: 141 mmol/L (ref 135–145)
Total Bilirubin: 0.9 mg/dL (ref 0.3–1.2)
Total Protein: 5.6 g/dL — ABNORMAL LOW (ref 6.5–8.1)

## 2020-09-09 LAB — GLUCOSE, CAPILLARY
Glucose-Capillary: 121 mg/dL — ABNORMAL HIGH (ref 70–99)
Glucose-Capillary: 159 mg/dL — ABNORMAL HIGH (ref 70–99)
Glucose-Capillary: 80 mg/dL (ref 70–99)
Glucose-Capillary: 91 mg/dL (ref 70–99)
Glucose-Capillary: 96 mg/dL (ref 70–99)

## 2020-09-09 MED ORDER — MAGNESIUM SULFATE 2 GM/50ML IV SOLN
2.0000 g | Freq: Once | INTRAVENOUS | Status: AC
  Administered 2020-09-09: 2 g via INTRAVENOUS
  Filled 2020-09-09: qty 50

## 2020-09-09 MED ORDER — CARVEDILOL 6.25 MG PO TABS
18.7500 mg | ORAL_TABLET | Freq: Two times a day (BID) | ORAL | Status: DC
  Administered 2020-09-09 – 2020-09-10 (×3): 18.75 mg via ORAL
  Filled 2020-09-09 (×3): qty 1

## 2020-09-09 MED ORDER — FUROSEMIDE 10 MG/ML IJ SOLN
80.0000 mg | Freq: Two times a day (BID) | INTRAMUSCULAR | Status: AC
  Administered 2020-09-09: 80 mg via INTRAVENOUS
  Filled 2020-09-09: qty 8

## 2020-09-09 MED ORDER — POTASSIUM CHLORIDE CRYS ER 20 MEQ PO TBCR
20.0000 meq | EXTENDED_RELEASE_TABLET | Freq: Once | ORAL | Status: AC
  Administered 2020-09-09: 20 meq via ORAL
  Filled 2020-09-09: qty 1

## 2020-09-09 NOTE — Progress Notes (Signed)
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 Acute on Chronic Combined Systolic/Diastolic CHF  Net IO Since Admission: -4,129.78 mL [09/09/20 1257] --Mgmt per cardiology, see their recs --IV Lasix again today --Continue: Coreg, losartan, hydralazine, Imdur, Aldactone, Farxiga, digoxin.   --No Entresto due to hx of angioedema with lisinopril --Cardiac rehab --Monitor lytes and renal function --Maintain K>4 and Mg>2 --No Driving x 6 months  Alcohol Use Disorder - chronic. CIWA scores okay, no significant withdrawal symptoms a this point. Was treated with phenobarbital, d/c'd by cardiology. --Continue low dose Xanax for now --Would not rx benzo on d/c --TOC consulted for resources  Generalized anxiety - chronic, pt self-medicates with alcohol. Seen by psychiatry 6/14, recommended low dose hydroxyzine.  Pt so far declined to use. --Continue PRN hydroxyzine --Taper off Xanax --Hold Remeron and trazodone due to prolonged Qtc  ?Type 2 diabetes - intermittent hyperglycemia, glucose 184 on AM labs 6/15. Some daytime readings in -130's. --Follow up pending A1c for risk stratification.  Aortic root dilation - needs serial outpatient Echo's for surveillance per cardiology.  Hx of homelessness - living with mother locally since coming to [facility] from [place].  Anticipate d/c home with mother.  TOC consulted.   Obesity: Body mass index is 30.11 kg/m.  Complicates overall care and prognosis.  Recommend lifestyle modifications including physical activity and diet for weight loss and overall long-term health.   DVT prophylaxis: heparin injection 5,000  Units Start: 09/03/20 2200 SCDs Start: 09/03/20 2053   Diet:  Diet Orders (From admission, onward)     Start     Ordered   09/08/20 1504  Diet Heart Room service appropriate? Yes; Fluid consistency: Thin; Fluid restriction: 1500 mL Fluid  Diet effective now       Question Answer Comment  Room service appropriate? Yes   Fluid consistency: Thin   Fluid restriction: 1500 mL Fluid      09/08/20 1504              Code Status: Full Code   Brief Narrative / Hospital Course to Date:   45 year old blk male originally from [place] Heavy EtOH, HTN, CHF status post AICD ([facility] cardiology [place]) Prior [facility] 2017+ cervical/lumbosacral spinal trauma?  On narcotics Known HF R EF 19% previously (an ICM negative stress 10/2014) with HF improved EF subsequently as followed by cardiology in [place] It appears he is essentially homeless as of 08/20/2020 secondary to divorce and has poor access to medical care or medications for the past 6 to 7 months prior to coming to [place] He was admitted to [facility] alcohol detox center recently but was transferred to [facility] 08/21/20 with anasarca decompensated RHF and cardiorenal syndrome At that hospitalization he was worked up, discharged on goal-directed medical therapy for HFrEF  Patient admitted to [facility] feeling "funny" 09/03/2020 Pacemaker defibrillator from several times he was started on amiodarone cardiology and critical care saw the patient  He was found to have recurrent torsades and was defibrillated 6-7 times cardiology saw the patient and continues to follow. Echo on 6/10  showed EF 10%-severe LV dilatation grade 3 diastolic restrictive dysfunction.  Undergoing IV diuresis per cardiology.     Subjective 09/09/20    Pt reports feeling fine today.  Seen awake in bed watching TV.  Ambulates hallway without dyspnea.  Denies any acute complaints.  Got more  IV Lasix today.     Disposition Plan & Communication   Status is: Inpatient  Remains inpatient appropriate because:IV treatments appropriate due to intensity of illness or inability to take PO  Dispo: The patient is from: Home              Anticipated d/c is to: Home              Patient currently is not medically stable to d/c.   Difficult to place patient No    Consults, Procedures, Significant Events   Consultants:  Cardiology  Procedures:  None  Antimicrobials:  Anti-infectives (From admission, onward)    None         Micro    Objective   Vitals:   09/09/20 0411 09/09/20 0600 09/09/20 0905 09/09/20 1114  BP: 121/89  (!) 131/110 112/80  Pulse: 88  94 97  Resp: 18   16  Temp: 99 F (37.2 C)   98.1 F (36.7 C)  TempSrc: Oral   Oral  SpO2: 95%   95%  Weight:  100.7 kg    Height:        Intake/Output Summary (Last 24 hours) at 09/09/2020 1252 Last data filed at 09/09/2020 0540 Gross per 24 hour  Intake 236 ml  Output 3475 ml  Net -3239 ml   Filed Weights   09/07/20 0512 09/08/20 0500 09/09/20 0600  Weight: 104.6 kg 104.8 kg 100.7 kg    Physical Exam:  General exam: awake, alert, no acute distress HEENT: atraumatic, clear conjunctiva, anicteric sclera, moist mucus membranes, hearing grossly normal  Respiratory system: CTAB, no wheezes, rales or rhonchi, normal respiratory effort. Cardiovascular system: normal S1/S2, RRR.   Gastrointestinal system: soft, NT, ND, no HSM felt, +bowel sounds. Central nervous system: A&O x4. no gross focal neurologic deficits, normal speech Extremities: moves all, normal tone Psychiatry: normal mood, congruent affect, judgement and insight appear normal  Labs   Data Reviewed: I have personally reviewed following labs and imaging studies  CBC: Recent Labs  Lab 09/03/20 2011 09/03/20 2036 09/06/20 0029 09/08/20 0043 09/09/20 0117  WBC 5.7  --  7.6 7.9 7.4  NEUTROABS  --   --   --  3.9 3.4  HGB 13.6 15.3  12.8* 13.6 12.8*  HCT 39.9 45.0 37.3* 40.8 39.4  MCV 83.1  --  82.9 86.1 86.4  PLT 206  --  117* 148* 160   Basic Metabolic Panel: Recent Labs  Lab 09/04/20 0251 09/04/20 0949 09/05/20 0158 09/06/20 0029 09/07/20 0033 09/08/20 0043 09/09/20 0117 09/09/20 0813  NA 139   < > 138 138 137 136 141 137  K 2.9*   < > 3.0* 3.2* 3.6 4.5 3.8 3.9  CL 88*   < > 90* 95* 96* 98 97* 98  CO2 39*   < > 35* 35* 34* 29 35* 31  GLUCOSE 105*   < > 96 142* 144* 93 96 184*  BUN 10   < > 11 13 13 14 11 11   CREATININE 1.14   < > 1.18 1.35* 1.29* 1.35* 1.47* 1.46*  CALCIUM 7.1*   < > 7.1* 7.0* 7.5* 8.0* 8.4* 8.2*  MG 2.3  --  1.9 2.2 2.0 2.1 1.8  --   PHOS 4.1  --  3.5 3.7 2.5  --   --   --    < > = values in this interval not displayed.   GFR: Estimated Creatinine Clearance: 78.4 mL/min (A) (by C-G formula based on SCr of 1.46 mg/dL (H)). Liver Function Tests: Recent Labs  Lab 09/04/20 0251 09/05/20 0158 09/06/20 0029 09/07/20 0033 09/09/20 0117  AST 82* 95* 80* 65* 43*  ALT 37 37 32 34 33  ALKPHOS 198* 194* 172* 166* 141*  BILITOT 1.2 1.6* 1.3* 1.1 0.9  PROT 6.1* 6.0* 4.9* 5.3* 5.6*  ALBUMIN 3.2* 3.0* 2.5* 2.8* 2.8*   No results for input(s): LIPASE, AMYLASE in the last 168 hours. No results for input(s): AMMONIA in the last 168 hours. Coagulation Profile: Recent Labs  Lab 09/06/20 0757  INR 1.2   Cardiac Enzymes: No results for input(s): CKTOTAL, CKMB, CKMBINDEX, TROPONINI in the last 168 hours. BNP (last 3 results) No results for input(s): PROBNP in the last 8760 hours. HbA1C: No results for input(s): HGBA1C in the last 72 hours. CBG: Recent Labs  Lab 09/08/20 1552 09/08/20 2027 09/08/20 2310 09/09/20 0410 09/09/20 1116  GLUCAP 98 136* 138* 80 91   Lipid Profile: No results for input(s): CHOL, HDL, LDLCALC, TRIG, CHOLHDL, LDLDIRECT in the last 72 hours. Thyroid Function Tests: No results for input(s): TSH, T4TOTAL, FREET4, T3FREE, THYROIDAB in the last 72  hours. Anemia Panel: No results for input(s): VITAMINB12, FOLATE, FERRITIN, TIBC, IRON, RETICCTPCT in the last 72 hours. Sepsis Labs: No results for input(s): PROCALCITON, LATICACIDVEN in the last 168 hours.  Recent Results (from the past 240 hour(s))  Resp Panel by RT-PCR (Flu A&B, Covid) Nasopharyngeal Swab     Status: None   Collection Time: 09/03/20  8:26 PM   Specimen: Nasopharyngeal Swab; Nasopharyngeal(NP) swabs in vial transport medium  Result Value Ref Range Status   SARS Coronavirus 2 by RT PCR NEGATIVE NEGATIVE Final    Comment: (NOTE) SARS-CoV-2 target nucleic acids are NOT DETECTED.  The SARS-CoV-2 RNA is generally detectable in upper respiratory specimens during the acute phase of infection. The lowest concentration of SARS-CoV-2 viral copies this assay can detect is 138 copies/mL. A negative result does not preclude SARS-Cov-2 infection and should not be used as the sole basis for treatment or other patient management decisions. A negative result may occur with  improper specimen collection/handling, submission of specimen other than nasopharyngeal swab, presence of viral mutation(s) within the areas targeted by this assay, and inadequate number of viral copies(<138 copies/mL). A negative result must be combined with clinical observations, patient history, and epidemiological information. The expected result is Negative.  Fact Sheet for Patients:  BloggerCourse.com  Fact Sheet for Healthcare Providers:  SeriousBroker.it  This test is no t yet approved or cleared by the [place] FDA and  has been authorized for detection and/or diagnosis of SARS-CoV-2 by FDA under an Emergency Use Authorization (EUA). This EUA will remain  in effect (meaning this test can be used) for the duration of the COVID-19 declaration under Section 564(b)(1) of the Act, 21 U.S.C.section 360bbb-3(b)(1), unless the authorization is  terminated  or revoked sooner.       Influenza A by PCR NEGATIVE NEGATIVE Final   Influenza B by PCR NEGATIVE NEGATIVE Final    Comment: (NOTE) The Xpert Xpress SARS-CoV-2/FLU/RSV plus assay is intended as an aid in the diagnosis of influenza from Nasopharyngeal swab specimens and should not be  used as a sole basis for treatment. Nasal washings and aspirates are unacceptable for Xpert Xpress SARS-CoV-2/FLU/RSV testing.  Fact Sheet for Patients: BloggerCourse.com  Fact Sheet for Healthcare Providers: SeriousBroker.it  This test is not yet approved or cleared by the [place] FDA and has been authorized for detection and/or diagnosis of SARS-CoV-2 by FDA under an Emergency Use Authorization (EUA). This EUA will remain in effect (meaning this test can be used) for the duration of the COVID-19 declaration under Section 564(b)(1) of the Act, 21 U.S.C. section 360bbb-3(b)(1), unless the authorization is terminated or revoked.  Performed at [facility], [address], [place], [place] [address]   MRSA PCR Screening     Status: None   Collection Time: 09/03/20 11:00 PM   Specimen: Nasal Mucosa; Nasopharyngeal  Result Value Ref Range Status   MRSA by PCR NEGATIVE NEGATIVE Final    Comment:        The GeneXpert MRSA Assay (FDA approved for NASAL specimens only), is one component of a comprehensive MRSA colonization surveillance program. It is not intended to diagnose MRSA infection nor to guide or monitor treatment for MRSA infections. Performed at [facility], [address], [place], [place] [address]       Imaging Studies   No results found.   Medications   Scheduled Meds:  aspirin EC  81 mg Oral Daily   carvedilol  18.75 mg Oral BID WC   dapagliflozin propanediol  10 mg Oral Daily   digoxin  0.125 mg Oral Daily   folic acid  1 mg Oral Daily   furosemide  80 mg Intravenous BID   heparin   5,000 Units Subcutaneous Q8H   hydrALAZINE  50 mg Oral BID   insulin aspart  0-15 Units Subcutaneous Q4H   isosorbide mononitrate  30 mg Oral Daily   levothyroxine  25 mcg Oral Q0600   losartan  50 mg Oral Daily   multivitamin with minerals  1 tablet Oral Daily   naproxen  500 mg Oral BID WC   potassium chloride  20 mEq Oral Once   potassium chloride  40 mEq Oral Daily   spironolactone  25 mg Oral Daily   thiamine  100 mg Oral Daily   Continuous Infusions:  magnesium sulfate bolus IVPB         LOS: 6 days    Time spent: 30 minutes    Pennie Banter, DO [facility]  09/09/2020, 12:52 PM      If 7PM-7AM, please contact night-coverage. How to contact the [facility] Attending or Consulting provider 7A - 7P or covering provider during after hours 7P -7A, for this patient?    Check the care team in [facility] and look for a) attending/consulting TRH provider listed and b) the [facility] team listed Log into [email or web address] and use [place]'s universal password to access. If you do not have the password, please contact the hospital operator. Locate the [facility] provider you are looking for under [facility] and page to a number that you can be directly reached. If you still have difficulty reaching the provider, please page the [facility] (Director on Call) for the Hospitalists listed on amion for assistance.

## 2020-09-09 NOTE — Plan of Care (Signed)
  Problem: Activity: Goal: Risk for activity intolerance will decrease Outcome: Progressing   Problem: Education: Goal: Knowledge of General Education information will improve Description: Including pain rating scale, medication(s)/side effects and non-pharmacologic comfort measures Outcome: Progressing   Problem: Health Behavior/Discharge Planning: Goal: Ability to manage health-related needs will improve Outcome: Progressing   Problem: Clinical Measurements: Goal: Ability to maintain clinical measurements within normal limits will improve Outcome: Progressing Goal: Will remain free from infection Outcome: Progressing Goal: Diagnostic test results will improve Outcome: Progressing Goal: Respiratory complications will improve Outcome: Progressing Goal: Cardiovascular complication will be avoided Outcome: Progressing   Problem: Activity: Goal: Risk for activity intolerance will decrease Outcome: Progressing   Problem: Nutrition: Goal: Adequate nutrition will be maintained Outcome: Progressing   Problem: Coping: Goal: Level of anxiety will decrease Outcome: Progressing   Problem: Elimination: Goal: Will not experience complications related to bowel motility Outcome: Progressing Goal: Will not experience complications related to urinary retention Outcome: Progressing   Problem: Pain Managment: Goal: General experience of comfort will improve Outcome: Progressing   Problem: Safety: Goal: Ability to remain free from injury will improve Outcome: Progressing   Problem: Skin Integrity: Goal: Risk for impaired skin integrity will decrease Outcome: Progressing   Problem: Education: Goal: Ability to demonstrate management of disease process will improve Outcome: Progressing Goal: Ability to verbalize understanding of medication therapies will improve Outcome: Progressing Goal: Individualized Educational Video(s) Outcome: Progressing   Problem: Activity: Goal:  Capacity to carry out activities will improve Outcome: Progressing   Problem: Cardiac: Goal: Ability to achieve and maintain adequate cardiopulmonary perfusion will improve Outcome: Progressing   

## 2020-09-09 NOTE — Progress Notes (Addendum)
Advanced Heart Failure Rounding Note  PCP-Cardiologist: None   Subjective:    6/14 Reds Clip 46%. Diuresed with IV lasix. Brisk diuresis noted. Weight down 9 pounds. Overall weight down 18 pounds.   Walked 400 feet.   Feels better. No complaints.   Objective:   Weight Range: 100.7 kg Body mass index is 30.11 kg/m.   Vital Signs:   Temp:  [98.5 F (36.9 C)-99.3 F (37.4 C)] 99 F (37.2 C) (06/15 0411) Pulse Rate:  [88-98] 88 (06/15 0411) Resp:  [18-24] 18 (06/15 0411) BP: (112-144)/(89-120) 121/89 (06/15 0411) SpO2:  [93 %-97 %] 95 % (06/15 0411) Weight:  [100.7 kg] 100.7 kg (06/15 0600) Last BM Date: 09/08/20  Weight change: Filed Weights   09/07/20 0512 09/08/20 0500 09/09/20 0600  Weight: 104.6 kg 104.8 kg 100.7 kg    Intake/Output:   Intake/Output Summary (Last 24 hours) at 09/09/2020 0717 Last data filed at 09/09/2020 0540 Gross per 24 hour  Intake 476 ml  Output 5325 ml  Net -4849 ml      Physical Exam   General:  Sitting on the side of the bed. No resp difficulty HEENT: normal Neck: supple. no JVD. Carotids 2+ bilat; no bruits. No lymphadenopathy or thryomegaly appreciated. Cor: PMI nondisplaced. Regular rate & rhythm. No rubs, gallops or murmurs. Lungs: clear Abdomen: soft, nontender, nondistended. No hepatosplenomegaly. No bruits or masses. Good bowel sounds. Extremities: no cyanosis, clubbing, rash, edema Neuro: alert & orientedx3, cranial nerves grossly intact. moves all 4 extremities w/o difficulty. Affect pleasant  Telemetry  SR 80-90s   EKG    N/A Labs    CBC Recent Labs    09/08/20 0043 09/09/20 0117  WBC 7.9 7.4  NEUTROABS 3.9 3.4  HGB 13.6 12.8*  HCT 40.8 39.4  MCV 86.1 86.4  PLT 148* 160   Basic Metabolic Panel Recent Labs    61/95/09 0033 09/08/20 0043 09/09/20 0117  NA 137 136 141  K 3.6 4.5 3.8  CL 96* 98 97*  CO2 34* 29 35*  GLUCOSE 144* 93 96  BUN 13 14 11   CREATININE 1.29* 1.35* 1.47*  CALCIUM 7.5*  8.0* 8.4*  MG 2.0 2.1 1.8  PHOS 2.5  --   --    Liver Function Tests Recent Labs    09/07/20 0033 09/09/20 0117  AST 65* 43*  ALT 34 33  ALKPHOS 166* 141*  BILITOT 1.1 0.9  PROT 5.3* 5.6*  ALBUMIN 2.8* 2.8*   No results for input(s): LIPASE, AMYLASE in the last 72 hours. Cardiac Enzymes No results for input(s): CKTOTAL, CKMB, CKMBINDEX, TROPONINI in the last 72 hours.  BNP: BNP (last 3 results) No results for input(s): BNP in the last 8760 hours.  ProBNP (last 3 results) No results for input(s): PROBNP in the last 8760 hours.   D-Dimer No results for input(s): DDIMER in the last 72 hours. Hemoglobin A1C No results for input(s): HGBA1C in the last 72 hours. Fasting Lipid Panel No results for input(s): CHOL, HDL, LDLCALC, TRIG, CHOLHDL, LDLDIRECT in the last 72 hours. Thyroid Function Tests No results for input(s): TSH, T4TOTAL, T3FREE, THYROIDAB in the last 72 hours.  Invalid input(s): FREET3  Other results:   Imaging    No results found.   Medications:     Scheduled Medications:  aspirin EC  81 mg Oral Daily   carvedilol  12.5 mg Oral BID WC   dapagliflozin propanediol  10 mg Oral Daily   digoxin  0.125 mg Oral  Daily   folic acid  1 mg Oral Daily   furosemide  80 mg Intravenous BID   heparin  5,000 Units Subcutaneous Q8H   hydrALAZINE  50 mg Oral BID   insulin aspart  0-15 Units Subcutaneous Q4H   isosorbide mononitrate  30 mg Oral Daily   levothyroxine  25 mcg Oral Q0600   losartan  50 mg Oral Daily   multivitamin with minerals  1 tablet Oral Daily   naproxen  500 mg Oral BID WC   potassium chloride  40 mEq Oral Daily   spironolactone  25 mg Oral Daily   thiamine  100 mg Oral Daily    Infusions:   PRN Medications: benzonatate, clonazePAM, docusate sodium, melatonin, polyethylene glycol     Assessment/Plan  VT/VF Multiple shocks for VT/VF in the setting ETOH abuse.  K and Mag replaced. Will need to keep K >4 and Mag >2. Stable today   EP following. Off amio with QT prolongation. Has [employer] ICD Discussed no driving 6 months.    2. A/C Biventricular Heart Failure, presume NICM--> ETOH  ECHO EF 10% with severe RV failure back in 2021 EF was up to 40-45% - Volume status improving. Give one more dose of IV lasix. Check Reds Clip. - No entresto with h/o angioedema with lisinopril. Continue losartan 50 mg daily.  - Increase carvedilol 18.75 mg twice a day.  - Continue hydralazine to 50 mg twice a day.  - Continue imdur 30 mg daiy - Continue spiro 25 mg daily - Continue  farxiga 10 mg daily - Continue  digoxin 0.125 mg daily - Renal function trending up.  - Cardiac Rehab appreciated.    3. ETOH Abuse -Off phenobarbital.  On thiamine. -HF SW appreciated. Resources provided.    4. HTN  -Elevated on admit.  - Improving. Increase carvedilol as noted above.     5. Noncompliance No recent home medications.   6. DMII Hgb AiC 6.6 Continue farxiga 10 mg daily.    7. Tobacco Abuse Discussed smoking cessation.  Social - he was homeless prior to moving down to [facility] but now living with his Mom. HF SW appreciated. Will need all meds prior to discharge.   Re-check Reds Clip today.   Length of Stay: 6  Amy Clegg, NP  09/09/2020, 7:17 AM  Advanced Heart Failure Team Pager [phone] (M-F; 7a - 5p)  Please contact [facility] for night-coverage after hours (5p -7a ) and weekends on [email or web address]  Patient seen with NP, agree with the above note.   Good diuresis yesterday with IV Lasix, creatinine 1.47. BP stable. Feeling better.  REDS clip still high at 43%.  General: NAD Neck: JVP 10 cm, no thyromegaly or thyroid nodule.  Lungs: Clear to auscultation bilaterally with normal respiratory effort. CV: Nondisplaced PMI.  Heart regular S1/S2, no S3/S4, no murmur.  1+ ankle edema.  Abdomen: Soft, nontender, no hepatosplenomegaly, no distention.  Skin: Intact without lesions or rashes.  Neurologic: Alert and  oriented x 3.  Psych: Normal affect. Extremities: No clubbing or cyanosis.  HEENT: Normal.   Still with volume overload, think he will need 1 more day of IV Lasix, continue Lasix 80 mg IV bid. Keep other cardiac meds unchanged today.  Probably home tomorrow on torsemide 40 mg po daily.   Urged to stop drinking ETOH.   Marca Ancona 09/09/2020 1:27 PM

## 2020-09-09 NOTE — Progress Notes (Signed)
REDS Clip  READING= 46%>44%>> 43%  Sherron Monday, PharmD, BCCCP Clinical Pharmacist  Phone: [phone] 09/09/2020 9:33 AM  Please check AMION for all [facility] phone numbers After 10:00 PM, call Main Pharmacy [phone]

## 2020-09-10 ENCOUNTER — Other Ambulatory Visit (HOSPITAL_COMMUNITY): Payer: Self-pay

## 2020-09-10 DIAGNOSIS — I428 Other cardiomyopathies: Secondary | ICD-10-CM

## 2020-09-10 DIAGNOSIS — I5082 Biventricular heart failure: Secondary | ICD-10-CM

## 2020-09-10 DIAGNOSIS — F1020 Alcohol dependence, uncomplicated: Secondary | ICD-10-CM

## 2020-09-10 LAB — GLUCOSE, CAPILLARY
Glucose-Capillary: 94 mg/dL (ref 70–99)
Glucose-Capillary: 172 mg/dL — ABNORMAL HIGH (ref 70–99)
Glucose-Capillary: 100 mg/dL — ABNORMAL HIGH (ref 70–99)

## 2020-09-10 LAB — HEMOGLOBIN A1C
Hgb A1c MFr Bld: 6.6 % — ABNORMAL HIGH (ref 4.8–5.6)
Mean Plasma Glucose: 143 mg/dL

## 2020-09-10 LAB — BASIC METABOLIC PANEL
Anion gap: 8 (ref 5–15)
BUN: 13 mg/dL (ref 6–20)
CO2: 30 mmol/L (ref 22–32)
Calcium: 8.4 mg/dL — ABNORMAL LOW (ref 8.9–10.3)
Chloride: 100 mmol/L (ref 98–111)
Creatinine, Ser: 1.57 mg/dL — ABNORMAL HIGH (ref 0.61–1.24)
GFR, Estimated: 55 mL/min — ABNORMAL LOW (ref >60)
Glucose, Bld: 141 mg/dL — ABNORMAL HIGH (ref 70–99)
Potassium: 4.2 mmol/L (ref 3.5–5.1)
Sodium: 138 mmol/L (ref 135–145)

## 2020-09-10 LAB — BRAIN NATRIURETIC PEPTIDE: B Natriuretic Peptide: 2079.7 pg/mL — ABNORMAL HIGH (ref 0.0–100.0)

## 2020-09-10 LAB — MAGNESIUM: Magnesium: 2.2 mg/dL (ref 1.7–2.4)

## 2020-09-10 MED ORDER — FOLIC ACID 1 MG PO TABS: 1.0000 mg | ORAL_TABLET | Freq: Every day | ORAL | Status: DC

## 2020-09-10 MED ORDER — THIAMINE HCL 100 MG PO TABS: 100.0000 mg | ORAL_TABLET | Freq: Every day | ORAL | Status: AC

## 2020-09-10 MED ORDER — FOLIC ACID 1 MG PO TABS: 1.0000 mg | ORAL_TABLET | Freq: Every day | ORAL | Status: AC

## 2020-09-10 MED ORDER — HYDRALAZINE HCL 25 MG PO TABS: 75.0000 mg | ORAL_TABLET | Freq: Two times a day (BID) | ORAL | 1 refills | Status: DC

## 2020-09-10 MED ORDER — LOSARTAN POTASSIUM 50 MG PO TABS: 50.0000 mg | ORAL_TABLET | Freq: Every day | ORAL | 1 refills | Status: DC

## 2020-09-10 MED ORDER — ISOSORBIDE MONONITRATE ER 60 MG PO TB24
60.0000 mg | ORAL_TABLET | Freq: Every day | ORAL | 1 refills | Status: DC
  Filled 2020-09-10: qty 30, 30d supply, fill #0

## 2020-09-10 MED ORDER — TORSEMIDE 20 MG PO TABS
40.0000 mg | ORAL_TABLET | Freq: Every day | ORAL | Status: DC
  Administered 2020-09-10: 40 mg via ORAL
  Filled 2020-09-10: qty 2

## 2020-09-10 MED ORDER — ASPIRIN 81 MG PO TBEC: 81.0000 mg | DELAYED_RELEASE_TABLET | Freq: Every day | ORAL | 11 refills | Status: DC

## 2020-09-10 MED ORDER — DAPAGLIFLOZIN PROPANEDIOL 10 MG PO TABS
10.0000 mg | ORAL_TABLET | Freq: Every day | ORAL | 1 refills | Status: DC
  Filled 2020-09-10: qty 30, 30d supply, fill #0

## 2020-09-10 MED ORDER — ASPIRIN 81 MG PO TBEC
81.0000 mg | DELAYED_RELEASE_TABLET | Freq: Every day | ORAL | 11 refills | Status: DC
  Filled 2020-09-10: qty 30, 30d supply, fill #0

## 2020-09-10 MED ORDER — MELATONIN 5 MG PO TABS: 5.0000 mg | ORAL_TABLET | Freq: Every evening | ORAL | 0 refills | Status: DC | PRN

## 2020-09-10 MED ORDER — CARVEDILOL 6.25 MG PO TABS: 18.7500 mg | ORAL_TABLET | Freq: Two times a day (BID) | ORAL | 1 refills | Status: DC

## 2020-09-10 MED ORDER — HYDRALAZINE HCL 25 MG PO TABS
75.0000 mg | ORAL_TABLET | Freq: Two times a day (BID) | ORAL | 1 refills | Status: DC
  Filled 2020-09-10: qty 90, 15d supply, fill #0

## 2020-09-10 MED ORDER — DAPAGLIFLOZIN PROPANEDIOL 10 MG PO TABS: 10.0000 mg | ORAL_TABLET | Freq: Every day | ORAL | 1 refills | Status: DC

## 2020-09-10 MED ORDER — DIGOXIN 125 MCG PO TABS
0.1250 mg | ORAL_TABLET | Freq: Every day | ORAL | 1 refills | Status: DC
  Filled 2020-09-10: qty 30, 30d supply, fill #0

## 2020-09-10 MED ORDER — POTASSIUM CHLORIDE CRYS ER 20 MEQ PO TBCR
40.0000 meq | EXTENDED_RELEASE_TABLET | Freq: Every day | ORAL | 0 refills | Status: DC
  Filled 2020-09-10: qty 30, 15d supply, fill #0

## 2020-09-10 MED ORDER — CARVEDILOL 6.25 MG PO TABS
18.7500 mg | ORAL_TABLET | Freq: Two times a day (BID) | ORAL | 1 refills | Status: DC
  Filled 2020-09-10: qty 90, 15d supply, fill #0

## 2020-09-10 MED ORDER — ISOSORBIDE MONONITRATE ER 60 MG PO TB24
60.0000 mg | ORAL_TABLET | Freq: Every day | ORAL | Status: DC
  Administered 2020-09-10: 60 mg via ORAL
  Filled 2020-09-10: qty 1

## 2020-09-10 MED ORDER — HYDRALAZINE HCL 50 MG PO TABS
75.0000 mg | ORAL_TABLET | Freq: Two times a day (BID) | ORAL | Status: DC
  Administered 2020-09-10: 75 mg via ORAL
  Filled 2020-09-10: qty 1

## 2020-09-10 MED ORDER — TORSEMIDE 40 MG PO TABS: 40.0000 mg | ORAL_TABLET | Freq: Every day | ORAL | 1 refills | Status: DC

## 2020-09-10 MED ORDER — LOSARTAN POTASSIUM 50 MG PO TABS
50.0000 mg | ORAL_TABLET | Freq: Every day | ORAL | 1 refills | Status: DC
  Filled 2020-09-10: qty 30, 30d supply, fill #0

## 2020-09-10 MED ORDER — ADULT MULTIVITAMIN W/MINERALS CH: 1.0000 | ORAL_TABLET | Freq: Every day | ORAL | Status: DC

## 2020-09-10 MED ORDER — ISOSORBIDE MONONITRATE ER 60 MG PO TB24: 60.0000 mg | ORAL_TABLET | Freq: Every day | ORAL | 1 refills | Status: DC

## 2020-09-10 MED ORDER — THIAMINE HCL 100 MG PO TABS: 100.0000 mg | ORAL_TABLET | Freq: Every day | ORAL | Status: DC

## 2020-09-10 MED ORDER — POTASSIUM CHLORIDE CRYS ER 20 MEQ PO TBCR: 40.0000 meq | EXTENDED_RELEASE_TABLET | Freq: Every day | ORAL | 0 refills | Status: DC

## 2020-09-10 MED ORDER — TORSEMIDE 20 MG PO TABS
40.0000 mg | ORAL_TABLET | Freq: Every day | ORAL | 1 refills | Status: DC
  Filled 2020-09-10: qty 60, 30d supply, fill #0

## 2020-09-10 MED ORDER — DIGOXIN 125 MCG PO TABS: 0.1250 mg | ORAL_TABLET | Freq: Every day | ORAL | 1 refills | Status: DC

## 2020-09-10 NOTE — Discharge Summary (Signed)
Physician Discharge Summary  Darryl Mitchell ZOX:[id number] DOB: [birth year] DOA: 09/03/2020  PCP: Pcp, No  Admit date: 09/03/2020 Discharge date: 09/10/2020  Admitted From: home Disposition:  home  Recommendations for Outpatient Follow-up:  Follow up with PCP in 1-2 weeks Please obtain BMP/CBC in one week Please follow up in Heart Failure Clinic Follow up with Cardiology as scheduled   Home Health: No  Equipment/Devices: None   Discharge Condition: Stable  CODE STATUS: Full    Diet recommendation: Heart Healthy with 1.5 L/day fluid restriction, low sodium   Discharge Diagnoses: Active Problems:   Torsades de pointes (HCC)   Nonischemic cardiomyopathy (HCC)   Biventricular heart failure (HCC)   Anxiety, generalized   Alcohol use disorder, severe, dependence (HCC)   Acute on chronic combined systolic and diastolic CHF (congestive heart failure) (HCC)    Summary of HPI and Hospital Course:  45 year old blk male originally from [place] Heavy EtOH, HTN, CHF status post AICD ([facility] [place]) Prior [facility] 2017+ cervical/lumbosacral spinal trauma?  On narcotics Known HF R EF 19% previously (an ICM negative stress 10/2014) with HF improved EF subsequently as followed by cardiology in [place] It appears he is essentially homeless as of 08/20/2020 secondary to divorce and has poor access to medical care or medications for the past 6 to 7 months prior to coming to [place] He was admitted to [facility] alcohol detox center recently but was transferred to [facility] 08/21/20 with anasarca decompensated RHF and cardiorenal syndrome At that hospitalization he was worked up, discharged on goal-directed medical therapy for HFrEF  Patient admitted to [facility] feeling "funny" 09/03/2020 Pacemaker defibrillator from several times he was started on amiodarone cardiology and critical care saw the patient  He was  found to have recurrent torsades and was defibrillated 6-7 times cardiology saw the patient and continues to follow. Echo on 6/10 showed EF 10%-severe LV dilatation grade 3 diastolic restrictive dysfunction.  Diuresed very well with IV Lasix per cardiology. Net IO Since Admission: -7,539.78 mL [09/10/20 1624]  Patient clinically improved and stable for d/c today.    Cardiology D/C Plan: followup in CHF clinic. Cardiac meds for discharge: torsemide 40 mg daily, Coreg 18.75 bid, dapagliflozin 10 daily, digoxin 0.125 daily, hydralazine 75 tid, Imdur 60 daily, losartan 50 daily, spironolactone 25 daily.  Meds were filled in-house [facility] pharmacy and provided to patient at time of discharge.       Recurrent Torsades de Pointes VT / VF in setting of EtOH abuse Biventricluar heart failure Non-ischemic cardiomyopathy with severely reduced LV systolic function, EF ~10% Has [employer] ICD in place. Acute on Chronic Combined Systolic/Diastolic CHF   Net IO Since Admission: -4,129.78 mL [09/09/20 1257] --Mgmt per cardiology, see their recs --Treated IV Lasix  --Continue meds as listed above --No Entresto due to hx of angioedema with lisinopril --Cardiac rehab and HF clinic after d/c --Monitor lytes and renal function --Maintain K>4 and Mg>2 --No Driving x 6 months   Alcohol Use Disorder - chronic, with above cardiac complications. CIWA scores good, no significant withdrawal symptoms. Was treated with phenobarbital, d/c'd by cardiology. Treated with low dose Xanax and tapered off. TOC consulted for resources to help with cessation. MV, thiamine and folic acid recommended at d/c. Absolutely need complete cessation of alcohol use given severe heart failure.     Generalized anxiety - chronic, pt self-medicates with alcohol. Seen by psychiatry 6/14, recommended low dose hydroxyzine.  Pt so far declined to use. --Continue PRN hydroxyzine --Discontinued Remeron and trazodone at discharge  due to prolonged Qtc above 600.  Caution and close monitoring recommended if resumed in outpatient setting.  Would use agents without QT-prolonging effects if possible.  Insomnia - melatonin PRN.   Trazodone d/c'd due to QT prolongation   New onset Type 2 diabetes - intermittent hyperglycemia, glucose 184 on AM labs 6/15. Some daytime readings in -130's. Hbg A1c is 6.6% Primary care follow up to discuss management options.   Aortic root dilation - needs serial outpatient Echo's for surveillance per cardiology.   Hx of homelessness - living with mother locally [place] [facility] [place].  Anticipate d/c home with mother.  TOC consulted.     Obesity: Body mass index is 30.11 kg/m.  Complicates overall care and prognosis.  Recommend lifestyle modifications including physical activity and diet for weight loss and overall long-term health.       Discharge Instructions   Discharge Instructions     (HEART FAILURE PATIENTS) Call MD:  Anytime you have any of the following symptoms: 1) 3 pound weight gain in 24 hours or 5 pounds in 1 week 2) shortness of breath, with or without a dry hacking cough 3) swelling in the hands, feet or stomach 4) if you have to sleep on extra pillows at night in order to breathe.   Complete by: As directed    AMB referral to CHF clinic   Complete by: As directed    Call MD for:  extreme fatigue   Complete by: As directed    Call MD for:  persistant dizziness or light-headedness   Complete by: As directed    Call MD for:  persistant nausea and vomiting   Complete by: As directed    Call MD for:  severe uncontrolled pain   Complete by: As directed    Call MD for:  temperature >100.4   Complete by: As directed    Diet - low sodium heart healthy   Complete by: As directed    1500 cc/day fluid restriction   Heart Failure patients record your daily weight using the same scale at the same time of day   Complete by: As directed    Increase activity slowly    Complete by: As directed    STOP any activity that causes chest pain, shortness of breath, dizziness, sweating, or exessive weakness   Complete by: As directed       Allergies as of 09/10/2020       Reactions   Lisinopril Swelling        Medication List     STOP taking these medications    furosemide 80 MG tablet Commonly known as: LASIX   mirtazapine 30 MG tablet Commonly known as: REMERON   traZODone 50 MG tablet Commonly known as: DESYREL       TAKE these medications    Aspirin Low Dose 81 MG EC tablet Generic drug: aspirin Take 1 tablet (81 mg total) by mouth daily. Swallow whole.   carvedilol 6.25 MG tablet Commonly known as: COREG Take 3 tablets (18.75 mg total) by mouth 2 (two) times daily with a meal. What changed:  medication strength how much to take when to take this   digoxin 0.125 MG tablet Commonly known as: LANOXIN Take 1 tablet (0.125 mg total) by mouth daily.   Farxiga 10 MG Tabs tablet Generic drug: dapagliflozin propanediol Take 1 tablet (10 mg total) by mouth  daily.   folic acid 1 MG tablet Commonly known as: FOLVITE Take 1 tablet (1 mg total) by mouth daily.   gabapentin 300 MG capsule Commonly known as: NEURONTIN Take 30 mg by mouth 3 (three) times daily.   hydrALAZINE 25 MG tablet Commonly known as: APRESOLINE Take 3 tablets (75 mg total) by mouth 2 (two) times daily. What changed:  medication strength how much to take   hydrOXYzine 50 MG capsule Commonly known as: VISTARIL Take 50 mg by mouth every 6 (six) hours as needed for anxiety.   isosorbide mononitrate 60 MG 24 hr tablet Commonly known as: IMDUR Take 1 tablet (60 mg total) by mouth daily.   levothyroxine 25 MCG tablet Commonly known as: SYNTHROID Take 25 mcg by mouth daily.   losartan 50 MG tablet Commonly known as: COZAAR Take 1 tablet (50 mg total) by mouth daily.   melatonin 5 MG Tabs Take 1 tablet (5 mg total) by mouth at bedtime as needed.    multivitamin with minerals Tabs tablet Take 1 tablet by mouth daily.   nicotine 21 mg/24hr patch Commonly known as: NICODERM CQ - dosed in mg/24 hours Place 21 mg onto the skin daily.   potassium chloride SA 20 MEQ tablet Commonly known as: KLOR-CON Take 2 tablets (40 mEq total) by mouth daily.   spironolactone 25 MG tablet Commonly known as: ALDACTONE Take 25 mg by mouth daily.   thiamine 100 MG tablet Take 1 tablet (100 mg total) by mouth daily.   torsemide 20 MG tablet Commonly known as: DEMADEX Take 2 tablets (40 mg total) by mouth daily.        Follow-up Information     [facility] Follow up.   Specialty: Cardiology Why: Wednesday September 23, 2020 at 3:30 PM at the [facility] at [facility], Entrance C. Parking Garage code [phone] Contact information: [address] [id number] Wilhemina Bonito [place] [address] [phone]        [facility] Follow up.   Specialty: Rehabilitation Why: outpatient physical therapy, they will call you for apt, if you do not hear from them please call them Contact information: [address] [id number] mc [place] [address] [phone]               Allergies  Allergen Reactions   Lisinopril Swelling     If you experience worsening of your admission symptoms, develop shortness of breath, life threatening emergency, suicidal or homicidal thoughts you must seek medical attention immediately by calling 911 or calling your MD immediately  if symptoms less severe.    Please note   You were cared for by a hospitalist during your hospital stay. If you have any questions about your discharge medications or the care you received while you were in the hospital after you are discharged, you can call the unit and asked to speak with the hospitalist on call if the hospitalist that took care of you is  not available. Once you are discharged, your primary care physician will handle any further medical issues. Please note that NO REFILLS for any discharge medications will be authorized once you are discharged, as it is imperative that you return to your primary care physician (or establish a relationship with a primary care physician if you do not have one) for your aftercare needs so that they can reassess your need for medications and monitor your lab values.   Consultations: cardiology  Procedures/Studies: CT ABDOMEN PELVIS WO CONTRAST  Result Date: 09/05/2020 CLINICAL DATA:  45 year old male with non localized abdominal and chest pain and a history of recent fall EXAM: CT CHEST, ABDOMEN AND PELVIS WITHOUT CONTRAST TECHNIQUE: Multidetector CT imaging of the chest, abdomen and pelvis was performed following the standard protocol without IV contrast. COMPARISON:  None. FINDINGS: CT CHEST FINDINGS Cardiovascular: Limited evaluation in the absence of intravenous contrast. No evidence of aortic aneurysm. Mild cardiomegaly. Trace pericardial effusion. No significant atherosclerotic plaque. Mediastinum/Nodes: Unremarkable CT appearance of the thyroid gland. No suspicious mediastinal or hilar adenopathy. No soft tissue mediastinal mass. The thoracic esophagus is unremarkable. Lungs/Pleura: Moderate right and small left layering pleural effusions. Associated dependent atelectasis. Mild ground-glass attenuation airspace opacities in a tree-in-bud configuration along a peribronchovascular distribution throughout the non atelectatic portion of the left lower lobe. Otherwise, the lungs are clear. Musculoskeletal: External cardiac defibrillator overlies the left chest. The generator is present along the left lateral chest wall. CT ABDOMEN PELVIS FINDINGS Hepatobiliary: Borderline hepatomegaly. No discrete hepatic lesion. High attenuation material within the gallbladder lumen consistent with cholelithiasis. No  biliary ductal dilatation. Pancreas: Unremarkable. No pancreatic ductal dilatation or surrounding inflammatory changes. Spleen: Normal in size without focal abnormality. Adrenals/Urinary Tract: Normal adrenal glands. No hydronephrosis, nephrolithiasis or enhancing renal mass. The ureters and bladder are unremarkable. Stomach/Bowel: No evidence of focal bowel wall thickening or bowel obstruction. Vascular/Lymphatic: Limited evaluation in the absence of intravenous contrast. No significant atherosclerotic plaque or evidence of aneurysm. No suspicious lymphadenopathy. Reproductive: Prostate is unremarkable. Other: Small volume ascites.  Small fat containing umbilical hernia. Musculoskeletal: No acute fracture or aggressive appearing lytic or blastic osseous lesion. IMPRESSION: 1. Cardiomegaly with an external defibrillator device in place. Findings are consistent with the clinical history of CHF. 2. Ground-glass attenuation airspace opacities in a tree-in-bud configuration throughout the left lower lobe concerning for an active infectious/inflammatory process such as bronchopneumonia. 3. Borderline hepatomegaly. Differential considerations include chronic hepatic venous congestion in the setting of right heart failure versus hepatic cirrhosis. 4. Moderate right and small left layering pleural effusions with associated atelectasis. 5. Cholelithiasis. 6. Small volume ascites. 7. Small fat containing umbilical hernia. Electronically Signed   By: Malachy Moan M.D.   On: 09/05/2020 13:43   DG Chest 2 View  Result Date: 09/04/2020 CLINICAL DATA:  Initial evaluation for ICD. EXAM: CHEST - 2 VIEW COMPARISON:  Prior radiograph from 09/03/2020. FINDINGS: Left-sided subcutaneous ICD in place. The tip of the electrode terminates adjacent to the aortic knob, stable from previous. Defibrillator pads overlie the right chest. Underlying moderate cardiomegaly. Lungs mildly hypoinflated. Mild diffuse pulmonary venous and  interstitial congestion. No definite pleural effusion. No focal infiltrates. No pneumothorax. Osseous structures unchanged. IMPRESSION: 1. Left-sided subcutaneous ICD in place with tip of the electrode terminating adjacent to the aortic knob. 2. Cardiomegaly with mild diffuse pulmonary venous congestion. Electronically Signed   By: Rise Mu M.D.   On: 09/04/2020 02:41   CT CHEST WO CONTRAST  Result Date: 09/05/2020 CLINICAL DATA:  45 year old male with non localized abdominal and chest pain and a history of recent fall EXAM: CT CHEST, ABDOMEN AND PELVIS WITHOUT CONTRAST TECHNIQUE: Multidetector CT imaging of the chest, abdomen and pelvis was performed following the standard protocol without IV contrast. COMPARISON:  None. FINDINGS: CT CHEST FINDINGS Cardiovascular: Limited evaluation in the absence of intravenous contrast. No evidence of aortic aneurysm. Mild cardiomegaly. Trace pericardial effusion. No significant atherosclerotic plaque. Mediastinum/Nodes: Unremarkable CT appearance of the thyroid gland. No suspicious  mediastinal or hilar adenopathy. No soft tissue mediastinal mass. The thoracic esophagus is unremarkable. Lungs/Pleura: Moderate right and small left layering pleural effusions. Associated dependent atelectasis. Mild ground-glass attenuation airspace opacities in a tree-in-bud configuration along a peribronchovascular distribution throughout the non atelectatic portion of the left lower lobe. Otherwise, the lungs are clear. Musculoskeletal: External cardiac defibrillator overlies the left chest. The generator is present along the left lateral chest wall. CT ABDOMEN PELVIS FINDINGS Hepatobiliary: Borderline hepatomegaly. No discrete hepatic lesion. High attenuation material within the gallbladder lumen consistent with cholelithiasis. No biliary ductal dilatation. Pancreas: Unremarkable. No pancreatic ductal dilatation or surrounding inflammatory changes. Spleen: Normal in size without  focal abnormality. Adrenals/Urinary Tract: Normal adrenal glands. No hydronephrosis, nephrolithiasis or enhancing renal mass. The ureters and bladder are unremarkable. Stomach/Bowel: No evidence of focal bowel wall thickening or bowel obstruction. Vascular/Lymphatic: Limited evaluation in the absence of intravenous contrast. No significant atherosclerotic plaque or evidence of aneurysm. No suspicious lymphadenopathy. Reproductive: Prostate is unremarkable. Other: Small volume ascites.  Small fat containing umbilical hernia. Musculoskeletal: No acute fracture or aggressive appearing lytic or blastic osseous lesion. IMPRESSION: 1. Cardiomegaly with an external defibrillator device in place. Findings are consistent with the clinical history of CHF. 2. Ground-glass attenuation airspace opacities in a tree-in-bud configuration throughout the left lower lobe concerning for an active infectious/inflammatory process such as bronchopneumonia. 3. Borderline hepatomegaly. Differential considerations include chronic hepatic venous congestion in the setting of right heart failure versus hepatic cirrhosis. 4. Moderate right and small left layering pleural effusions with associated atelectasis. 5. Cholelithiasis. 6. Small volume ascites. 7. Small fat containing umbilical hernia. Electronically Signed   By: Malachy Moan M.D.   On: 09/05/2020 13:43   DG Chest Port 1 View  Result Date: 09/05/2020 CLINICAL DATA:  Abnormal respiration.  Ventricular tachycardia. EXAM: PORTABLE CHEST 1 VIEW COMPARISON:  One day prior FINDINGS: Again identified are external pacer/defibrillator. Midline trachea. Moderate cardiomegaly. No pleural effusion or pneumothorax. No lobar consolidation. Pulmonary venous congestion is not significantly changed. Patchy areas of bibasilar atelectasis remain. IMPRESSION: Cardiomegaly with similar pulmonary venous congestion. Electronically Signed   By: Jeronimo Greaves M.D.   On: 09/05/2020 08:00   DG CHEST PORT  1 VIEW  Result Date: 09/04/2020 CLINICAL DATA:  Cardiac arrest. EXAM: PORTABLE CHEST 1 VIEW COMPARISON:  Earlier film, same date. FINDINGS: The external pacer paddle and wire/defibrillator are stable. The heart is enlarged but stable. The mediastinal and hilar contours are within normal limits. Patchy bibasilar atelectasis and mild vascular congestion but no overt pulmonary edema or definite pleural effusions. No pneumothorax. IMPRESSION: 1. Stable cardiac enlargement. 2. Patchy bibasilar atelectasis and mild vascular congestion but no overt pulmonary edema. Electronically Signed   By: Rudie Meyer M.D.   On: 09/04/2020 15:01   DG Chest Portable 1 View  Result Date: 09/03/2020 CLINICAL DATA:  Chest pain EXAM: PORTABLE CHEST 1 VIEW COMPARISON:  None. FINDINGS: Lungs are clear. No pneumothorax or pleural effusion. Implanted epicardial defibrillator noted. Cardiac size is mildly enlarged. Pulmonary vascularity is normal. No acute bone abnormality. IMPRESSION: Mild cardiomegaly. Electronically Signed   By: Helyn Numbers MD   On: 09/03/2020 21:02   ECHOCARDIOGRAM COMPLETE  Result Date: 09/04/2020    ECHOCARDIOGRAM REPORT   Patient Name:   Darryl Mitchell  Date of Exam: 09/04/2020 Medical Rec #:  [id number]  Height: Accession #:    [id number] Weight:       221.6 lb Date of Birth:  12-Apr-1975 BSA:  2.162 m Patient Age:    44 years   BP:           124/98 mmHg Patient Gender: M          HR:           77 bpm. Exam Location:  Inpatient Procedure: 2D Echo, Cardiac Doppler, Color Doppler and Intracardiac            Opacification Agent Indications:    Dilated cardiomyopathy  History:        Patient has no prior history of Echocardiogram examinations.                 Risk Factors:Hypertension.  Sonographer:    Gertie Fey MHA, RDMS, RVT, RDCS Referring Phys: [id number] Lorin Glass IMPRESSIONS  1. Left ventricular ejection fraction, by estimation, is 10%. The left ventricle has normal function. The left ventricle  demonstrates global hypokinesis. The left ventricular internal cavity size was severely dilated. Left ventricular diastolic parameters are consistent with Grade III diastolic dysfunction (restrictive). Elevated left ventricular end-diastolic pressure.  2. Right ventricular systolic function is severely reduced. The right ventricular size is moderately enlarged.  3. Left atrial size was mildly dilated.  4. The mitral valve is normal in structure. Mild mitral valve regurgitation. No evidence of mitral stenosis.  5. The aortic valve is normal in structure. Aortic valve regurgitation is not visualized. No aortic stenosis is present.  6. Aortic dilatation noted. There is mild dilatation of the aortic root, measuring 39 mm.  7. The inferior vena cava is normal in size with greater than 50% respiratory variability, suggesting right atrial pressure of 3 mmHg. FINDINGS  Left Ventricle: Left ventricular ejection fraction, by estimation, is 10%. The left ventricle has normal function. The left ventricle demonstrates global hypokinesis. Definity contrast agent was given IV to delineate the left ventricular endocardial borders. The left ventricular internal cavity size was severely dilated. There is no left ventricular hypertrophy. Left ventricular diastolic parameters are consistent with Grade III diastolic dysfunction (restrictive). Elevated left ventricular end-diastolic pressure. Right Ventricle: The right ventricular size is moderately enlarged. No increase in right ventricular wall thickness. Right ventricular systolic function is severely reduced. Left Atrium: Left atrial size was mildly dilated. Right Atrium: Right atrial size was normal in size. Pericardium: There is no evidence of pericardial effusion. Mitral Valve: The mitral valve is normal in structure. Mild mitral valve regurgitation. No evidence of mitral valve stenosis. Tricuspid Valve: The tricuspid valve is normal in structure. Tricuspid valve regurgitation is  mild . No evidence of tricuspid stenosis. Aortic Valve: The aortic valve is normal in structure. Aortic valve regurgitation is not visualized. No aortic stenosis is present. Aortic valve mean gradient measures 1.0 mmHg. Aortic valve peak gradient measures 2.2 mmHg. Aortic valve area, by VTI measures 3.87 cm. Pulmonic Valve: The pulmonic valve was normal in structure. Pulmonic valve regurgitation is mild. No evidence of pulmonic stenosis. Aorta: Aortic dilatation noted. There is mild dilatation of the aortic root, measuring 39 mm. Venous: The inferior vena cava is normal in size with greater than 50% respiratory variability, suggesting right atrial pressure of 3 mmHg. IAS/Shunts: No atrial level shunt detected by color flow Doppler.  LEFT VENTRICLE PLAX 2D LVIDd:         7.00 cm      Diastology LVIDs:         6.60 cm      LV e' medial:    2.48 cm/s LV PW:  0.70 cm      LV E/e' medial:  36.1 LV IVS:        0.50 cm      LV e' lateral:   4.00 cm/s LVOT diam:     2.90 cm      LV E/e' lateral: 22.4 LV SV:         48 LV SV Index:   22 LVOT Area:     6.61 cm  LV Volumes (MOD) LV vol d, MOD A2C: 168.0 ml LV vol d, MOD A4C: 169.0 ml LV vol s, MOD A2C: 158.0 ml LV vol s, MOD A4C: 166.0 ml LV SV MOD A2C:     10.0 ml LV SV MOD A4C:     169.0 ml LV SV MOD BP:      5.4 ml RIGHT VENTRICLE RV S prime:     7.88 cm/s TAPSE (M-mode): 1.3 cm LEFT ATRIUM              Index       RIGHT ATRIUM           Index LA diam:        4.50 cm  2.08 cm/m  RA Area:     21.10 cm LA Vol (A2C):   104.0 ml 48.10 ml/m RA Volume:   64.40 ml  29.79 ml/m LA Vol (A4C):   56.1 ml  25.95 ml/m LA Biplane Vol: 78.2 ml  36.17 ml/m  AORTIC VALVE AV Area (Vmax):    4.51 cm AV Area (Vmean):   4.49 cm AV Area (VTI):     3.87 cm AV Vmax:           74.50 cm/s AV Vmean:          53.200 cm/s AV VTI:            0.124 m AV Peak Grad:      2.2 mmHg AV Mean Grad:      1.0 mmHg LVOT Vmax:         50.90 cm/s LVOT Vmean:        36.200 cm/s LVOT VTI:           0.073 m LVOT/AV VTI ratio: 0.59  AORTA Ao Root diam: 3.30 cm MITRAL VALVE               TRICUSPID VALVE MV Area (PHT): 3.77 cm    TR Peak grad:   16.5 mmHg MV Decel Time: 201 msec    TR Vmax:        203.00 cm/s MR Peak grad: 53.3 mmHg MR Mean grad: 42.0 mmHg    SHUNTS MR Vmax:      365.00 cm/s  Systemic VTI:  0.07 m MR Vmean:     306.0 cm/s   Systemic Diam: 2.90 cm MV E velocity: 89.60 cm/s MV A velocity: 32.30 cm/s MV E/A ratio:  2.77 Armanda Magic MD Electronically signed by Armanda Magic MD Signature Date/Time: 09/04/2020/5:51:35 PM    Final       Subjective: Pt says feeling well this AM.  Can tell he's lost a lot of fluid weight, states it is easier to move around now.  No chest pain, defib events and no acute complaints.  Ready to get home.   Discharge Exam: Vitals:   09/10/20 0806 09/10/20 1103  BP: (!) 135/95 (!) 129/93  Pulse: 85 91  Resp: 17 17  Temp: 98.5 F (36.9 C) 98.7 F (37.1 C)  SpO2: 97% 98%  Vitals:   09/10/20 0407 09/10/20 0500 09/10/20 0806 09/10/20 1103  BP: (!) 129/92  (!) 135/95 (!) 129/93  Pulse: 83  85 91  Resp: 20  17 17   Temp: 98.7 F (37.1 C)  98.5 F (36.9 C) 98.7 F (37.1 C)  TempSrc: Oral  Oral Oral  SpO2: 95%  97% 98%  Weight:  99.5 kg    Height:        General: Pt is alert, awake, not in acute distress Cardiovascular: RRR, S1/S2 +, no rubs, no gallops Respiratory: CTA bilaterally, no wheezing, no rhonchi Abdominal: Soft, NT, ND, bowel sounds + Extremities: trace b/l LE edema, no cyanosis    The results of significant diagnostics from this hospitalization (including imaging, microbiology, ancillary and laboratory) are listed below for reference.     Microbiology: Recent Results (from the past 240 hour(s))  Resp Panel by RT-PCR (Flu A&B, Covid) Nasopharyngeal Swab     Status: None   Collection Time: 09/03/20  8:26 PM   Specimen: Nasopharyngeal Swab; Nasopharyngeal(NP) swabs in vial transport medium  Result Value Ref Range Status   SARS  Coronavirus 2 by RT PCR NEGATIVE NEGATIVE Final    Comment: (NOTE) SARS-CoV-2 target nucleic acids are NOT DETECTED.  The SARS-CoV-2 RNA is generally detectable in upper respiratory specimens during the acute phase of infection. The lowest concentration of SARS-CoV-2 viral copies this assay can detect is 138 copies/mL. A negative result does not preclude SARS-Cov-2 infection and should not be used as the sole basis for treatment or other patient management decisions. A negative result may occur with  improper specimen collection/handling, submission of specimen other than nasopharyngeal swab, presence of viral mutation(s) within the areas targeted by this assay, and inadequate number of viral copies(<138 copies/mL). A negative result must be combined with clinical observations, patient history, and epidemiological information. The expected result is Negative.  Fact Sheet for Patients:  11/03/20  Fact Sheet for Healthcare Providers:  BloggerCourse.com  This test is no t yet approved or cleared by the [place] FDA and  has been authorized for detection and/or diagnosis of SARS-CoV-2 by FDA under an Emergency Use Authorization (EUA). This EUA will remain  in effect (meaning this test can be used) for the duration of the COVID-19 declaration under Section 564(b)(1) of the Act, 21 U.S.C.section 360bbb-3(b)(1), unless the authorization is terminated  or revoked sooner.       Influenza A by PCR NEGATIVE NEGATIVE Final   Influenza B by PCR NEGATIVE NEGATIVE Final    Comment: (NOTE) The Xpert Xpress SARS-CoV-2/FLU/RSV plus assay is intended as an aid in the diagnosis of influenza from Nasopharyngeal swab specimens and should not be used as a sole basis for treatment. Nasal washings and aspirates are unacceptable for Xpert Xpress SARS-CoV-2/FLU/RSV testing.  Fact Sheet for  Patients: [place]  Fact Sheet for Healthcare Providers: BloggerCourse.com  This test is not yet approved or cleared by the [place] FDA and has been authorized for detection and/or diagnosis of SARS-CoV-2 by FDA under an Emergency Use Authorization (EUA). This EUA will remain in effect (meaning this test can be used) for the duration of the COVID-19 declaration under Section 564(b)(1) of the Act, 21 U.S.C. section 360bbb-3(b)(1), unless the authorization is terminated or revoked.  Performed at [facility], [address], [place], [place]   MRSA PCR Screening     Status: None   Collection Time: 09/03/20 11:00 PM   Specimen: Nasal Mucosa; Nasopharyngeal  Result Value  Ref Range Status   MRSA by PCR NEGATIVE NEGATIVE Final    Comment:        The GeneXpert MRSA Assay (FDA approved for NASAL specimens only), is one component of a comprehensive MRSA colonization surveillance program. It is not intended to diagnose MRSA infection nor to guide or monitor treatment for MRSA infections. Performed at [facility], [address], [place], [place] [address]      Labs: BNP (last 3 results) Recent Labs    09/10/20 0116  BNP 2,079.7*   Basic Metabolic Panel: Recent Labs  Lab 09/04/20 0251 09/04/20 0949 09/05/20 0158 09/06/20 0029 09/07/20 0033 09/08/20 0043 09/09/20 0117 09/09/20 0813 09/10/20 0116  NA 139   < > 138 138 137 136 141 137 138  K 2.9*   < > 3.0* 3.2* 3.6 4.5 3.8 3.9 4.2  CL 88*   < > 90* 95* 96* 98 97* 98 100  CO2 39*   < > 35* 35* 34* 29 35* 31 30  GLUCOSE 105*   < > 96 142* 144* 93 96 184* 141*  BUN 10   < > 11 13 13 14 11 11 13   CREATININE 1.14   < > 1.18 1.35* 1.29* 1.35* 1.47* 1.46* 1.57*  CALCIUM 7.1*   < > 7.1* 7.0* 7.5* 8.0* 8.4* 8.2* 8.4*  MG 2.3  --  1.9 2.2 2.0 2.1 1.8  --  2.2  PHOS 4.1  --  3.5 3.7 2.5  --   --   --   --    < > = values in this interval not  displayed.   Liver Function Tests: Recent Labs  Lab 09/04/20 0251 09/05/20 0158 09/06/20 0029 09/07/20 0033 09/09/20 0117  AST 82* 95* 80* 65* 43*  ALT 37 37 32 34 33  ALKPHOS 198* 194* 172* 166* 141*  BILITOT 1.2 1.6* 1.3* 1.1 0.9  PROT 6.1* 6.0* 4.9* 5.3* 5.6*  ALBUMIN 3.2* 3.0* 2.5* 2.8* 2.8*   No results for input(s): LIPASE, AMYLASE in the last 168 hours. No results for input(s): AMMONIA in the last 168 hours. CBC: Recent Labs  Lab 09/03/20 2011 09/03/20 2036 09/06/20 0029 09/08/20 0043 09/09/20 0117  WBC 5.7  --  7.6 7.9 7.4  NEUTROABS  --   --   --  3.9 3.4  HGB 13.6 15.3 12.8* 13.6 12.8*  HCT 39.9 45.0 37.3* 40.8 39.4  MCV 83.1  --  82.9 86.1 86.4  PLT 206  --  117* 148* 160   Cardiac Enzymes: No results for input(s): CKTOTAL, CKMB, CKMBINDEX, TROPONINI in the last 168 hours. BNP: Invalid input(s): POCBNP CBG: Recent Labs  Lab 09/09/20 2031 09/09/20 2338 09/10/20 0407 09/10/20 0806 09/10/20 1102  GLUCAP 96 121* 94 172* 100*   D-Dimer No results for input(s): DDIMER in the last 72 hours. Hgb A1c Recent Labs    09/09/20 0117  HGBA1C 6.6*   Lipid Profile No results for input(s): CHOL, HDL, LDLCALC, TRIG, CHOLHDL, LDLDIRECT in the last 72 hours. Thyroid function studies No results for input(s): TSH, T4TOTAL, T3FREE, THYROIDAB in the last 72 hours.  Invalid input(s): FREET3 Anemia work up No results for input(s): VITAMINB12, FOLATE, FERRITIN, TIBC, IRON, RETICCTPCT in the last 72 hours. Urinalysis No results found for: COLORURINE, APPEARANCEUR, LABSPEC, PHURINE, GLUCOSEU, HGBUR, BILIRUBINUR, KETONESUR, PROTEINUR, UROBILINOGEN, NITRITE, LEUKOCYTESUR Sepsis Labs Invalid input(s): PROCALCITONIN,  WBC,  LACTICIDVEN Microbiology Recent Results (from the past 240 hour(s))  Resp Panel by RT-PCR (Flu A&B, Covid) Nasopharyngeal  Swab     Status: None   Collection Time: 09/03/20  8:26 PM   Specimen: Nasopharyngeal Swab; Nasopharyngeal(NP) swabs in vial  transport medium  Result Value Ref Range Status   SARS Coronavirus 2 by RT PCR NEGATIVE NEGATIVE Final    Comment: (NOTE) SARS-CoV-2 target nucleic acids are NOT DETECTED.  The SARS-CoV-2 RNA is generally detectable in upper respiratory specimens during the acute phase of infection. The lowest concentration of SARS-CoV-2 viral copies this assay can detect is 138 copies/mL. A negative result does not preclude SARS-Cov-2 infection and should not be used as the sole basis for treatment or other patient management decisions. A negative result may occur with  improper specimen collection/handling, submission of specimen other than nasopharyngeal swab, presence of viral mutation(s) within the areas targeted by this assay, and inadequate number of viral copies(<138 copies/mL). A negative result must be combined with clinical observations, patient history, and epidemiological information. The expected result is Negative.  Fact Sheet for Patients:  BloggerCourse.com  Fact Sheet for Healthcare Providers:  [employer]iousBroker.it  This test is no t yet approved or cleared by the [place] FDA and  has been authorized for detection and/or diagnosis of SARS-CoV-2 by FDA under an Emergency Use Authorization (EUA). This EUA will remain  in effect (meaning this test can be used) for the duration of the COVID-19 declaration under Section 564(b)(1) of the Act, 21 U.S.C.section 360bbb-3(b)(1), unless the authorization is terminated  or revoked sooner.       Influenza A by PCR NEGATIVE NEGATIVE Final   Influenza B by PCR NEGATIVE NEGATIVE Final    Comment: (NOTE) The Xpert Xpress SARS-CoV-2/FLU/RSV plus assay is intended as an aid in the diagnosis of influenza from Nasopharyngeal swab specimens and should not be used as a sole basis for treatment. Nasal washings and aspirates are unacceptable for Xpert Xpress SARS-CoV-2/FLU/RSV testing.  Fact  Sheet for Patients: BloggerCourse.com  Fact Sheet for Healthcare Providers: [employer]iousBroker.it  This test is not yet approved or cleared by the [place] FDA and has been authorized for detection and/or diagnosis of SARS-CoV-2 by FDA under an Emergency Use Authorization (EUA). This EUA will remain in effect (meaning this test can be used) for the duration of the COVID-19 declaration under Section 564(b)(1) of the Act, 21 U.S.C. section 360bbb-3(b)(1), unless the authorization is terminated or revoked.  Performed at [facility], [address] [place], [place] [address]   MRSA PCR Screening     Status: None   Collection Time: 09/03/20 11:00 PM   Specimen: Nasal Mucosa; Nasopharyngeal  Result Value Ref Range Status   MRSA by PCR NEGATIVE NEGATIVE Final    Comment:        The GeneXpert MRSA Assay (FDA approved for NASAL specimens only), is one component of a comprehensive MRSA colonization surveillance program. It is not intended to diagnose MRSA infection nor to guide or monitor treatment for MRSA infections. Performed at [facility], [address], [place], [place] [address]      Time coordinating discharge: Over 30 minutes  SIGNED:   Pennie Banter, DO [facility] 09/10/2020, 4:25 PM   If 7PM-7AM, please contact night-coverage [email or web address]

## 2020-09-10 NOTE — Progress Notes (Signed)
Occupational Therapy Treatment Patient Details Name: Darryl Mitchell MRN: [id number] DOB: 10/31/1975 Today's Date: 09/10/2020    History of present illness 45 yo admitted 6/9 with seizure like activity with torsades de pointes s/p defibrillation x 7. PMhx: ETOH abuse, HTN, CHf s/p AICD, NICM, homelessness   OT comments  Patient progressing well towards OT goals. Completing basic Adls with modified independence, increased time and rest breaks required due to fatiguing easily but pt self pacing and taking breaks as needed. Scored 6/28 (questionable impairment) on Short blessed test but pt reports no changes in cognition voicing "I just don't remember what I don't want to"; able to recall compensatory strategies for med mgmt and reports his mom will be assisting him as needed.  OT will follow acutely.    Follow Up Recommendations  No OT follow up;Supervision - Intermittent    Equipment Recommendations  None recommended by OT    Recommendations for Other Services      Precautions / Restrictions Precautions Precautions: Fall Restrictions Weight Bearing Restrictions: No       Mobility Bed Mobility               General bed mobility comments: OOB at sink upon entry    Transfers Overall transfer level: Modified independent                    Balance Overall balance assessment: Mild deficits observed, not formally tested                                         ADL either performed or assessed with clinical judgement   ADL Overall ADL's : Needs assistance/impaired     Grooming: Independent;Standing           Upper Body Dressing : Modified independent;Sitting   Lower Body Dressing: Modified independent;Sit to/from stand   Toilet Transfer: Modified Independent;Ambulation           Functional mobility during ADLs: Modified independent General ADL Comments: pt continues to report fatiguing easily, increased rest breaks required     Vision        Perception     Praxis      Cognition Arousal/Alertness: Awake/alert Behavior During Therapy: WFL for tasks assessed/performed Overall Cognitive Status: Impaired/Different from baseline Area of Impairment: Memory;Problem solving                     Memory: Decreased short-term memory       Problem Solving: Slow processing;Difficulty sequencing;Requires verbal cues General Comments: short blessed test reveals deficits in STM, attention and sequencing scoring 6/28 (questionable impairment), able to complete 3 step trail making task without cueing to recall and pt reporting "I don't remember what I don't think is important".  Discussed compesnatory techniques for meds, pt able to verbalize using alarms or having his mom assist.        Exercises     Shoulder Instructions       General Comments      Pertinent Vitals/ Pain       Pain Assessment: No/denies pain  Home Living                                          Prior Functioning/Environment  Frequency  Min 2X/week        Progress Toward Goals  OT Goals(current goals can now be found in the care plan section)  Progress towards OT goals: Progressing toward goals  Acute Rehab OT Goals Patient Stated Goal: play bloody video games OT Goal Formulation: With patient  Plan Discharge plan remains appropriate;Frequency remains appropriate    Co-evaluation                 AM-PAC OT "6 Clicks" Daily Activity     Outcome Measure   Help from another person eating meals?: None Help from another person taking care of personal grooming?: A Little Help from another person toileting, which includes using toliet, bedpan, or urinal?: A Little Help from another person bathing (including washing, rinsing, drying)?: A Little Help from another person to put on and taking off regular upper body clothing?: A Little Help from another person to put on and taking off regular lower  body clothing?: A Little 6 Click Score: 19    End of Session    OT Visit Diagnosis: Other abnormalities of gait and mobility (R26.89);Muscle weakness (generalized) (M62.81)   Activity Tolerance Patient tolerated treatment well   Patient Left with call bell/phone within reach;Other (comment) (seated EOB)   Nurse Communication Mobility status        Time: 2585-2778 OT Time Calculation (min): 18 min  Charges: OT General Charges $OT Visit: 1 Visit OT Treatments $Self Care/Home Management : 8-22 mins  [name], OT Acute Rehabilitation Services Pager [phone] Office [phone]    [name] 09/10/2020, 11:50 AM

## 2020-09-10 NOTE — TOC Transition Note (Signed)
Transition of Care [facility]) - CM/SW Discharge Note Heart Failure   Patient Details  Name: Norris Brumbach MRN: [id number] Date of Birth: 1975-09-30  [facility]) CM/SW Contact:  Kiowa Hollar, LCSWA Phone Number: 09/10/2020, 10:40 AM   Clinical Narrative:    CSW spoke with the patient at bedside to bring them an appointment card for the [facility] outpatient clinic and encouraged them to follow up and to attend the appointment and bring their medications and if anything changes to please reach out so that CSW/HV clinic team can provide support. CSW called the patients mother, Steward Drone [phone] to inform her about the patients follow up appointment scheduled for 09/23/20 at 3:30pm. Steward Drone reported she will provide her son with transportation home after her appointment this afternoon at 3:30pm and will be able to take him to his follow up appointments.  CSW will sign off for now as social work intervention is no longer needed. Please consult [place] again if new needs arise.   Final next level of care: Home/Self Care Barriers to Discharge: No Barriers Identified   Patient Goals and CMS Choice Patient states their goals for this hospitalization and ongoing recovery are:: to return home at his mother's house      Discharge Placement                       Discharge Plan and Services In-house Referral: Clinical Social Work Discharge Planning Services: CM Consult                                 Social Determinants of Health (SDOH) Interventions Food Insecurity Interventions: Intervention Not Indicated Financial Strain Interventions: Other (Comment) (CSW encouraged pt. to call the number listed on his Medicare card to see about services in ) Housing Interventions: Intervention Not Indicated, Other (Comment) (Patient reports he is currently staying with his mother in [place] and his sister is here as well.) Transportation Interventions: Scientist, physiological   Readmission Risk Interventions No flowsheet data found.   Eyoel Throgmorton, MSW, LCSWA [phone] Heart Failure Social Worker

## 2020-09-10 NOTE — Progress Notes (Signed)
Patient ID: Darryl Mitchell, male   DOB: 04-09-1975, 45 y.o.   MRN: [id number]     Advanced Heart Failure Rounding Note  PCP-Cardiologist: None   Subjective:    Good diuresis yesterday, weight down another 3 lbs.  Creatinine 1.57.  Feels good, wants to go home.    Objective:   Weight Range: 99.5 kg Body mass index is 29.75 kg/m.   Vital Signs:   Temp:  [98.1 F (36.7 C)-98.9 F (37.2 C)] 98.5 F (36.9 C) (06/16 0806) Pulse Rate:  [83-97] 85 (06/16 0806) Resp:  [15-20] 17 (06/16 0806) BP: (112-135)/(80-110) 135/95 (06/16 0806) SpO2:  [95 %-97 %] 97 % (06/16 0806) Weight:  [99.5 kg] 99.5 kg (06/16 0500) Last BM Date: 09/09/20  Weight change: Filed Weights   09/08/20 0500 09/09/20 0600 09/10/20 0500  Weight: 104.8 kg 100.7 kg 99.5 kg    Intake/Output:   Intake/Output Summary (Last 24 hours) at 09/10/2020 0850 Last data filed at 09/10/2020 0400 Gross per 24 hour  Intake 240 ml  Output 3650 ml  Net -3410 ml      Physical Exam   General: NAD Neck: JVP 8 cm, no thyromegaly or thyroid nodule.  Lungs: Clear to auscultation bilaterally with normal respiratory effort. CV: Lateral PMI.  Heart regular S1/S2, no S3/S4, no murmur.  No peripheral edema.   Abdomen: Soft, nontender, no hepatosplenomegaly, no distention.  Skin: Intact without lesions or rashes.  Neurologic: Alert and oriented x 3.  Psych: Normal affect. Extremities: No clubbing or cyanosis.  HEENT: Normal.    Telemetry  SR 80-90s   EKG    N/A Labs    CBC Recent Labs    09/08/20 0043 09/09/20 0117  WBC 7.9 7.4  NEUTROABS 3.9 3.4  HGB 13.6 12.8*  HCT 40.8 39.4  MCV 86.1 86.4  PLT 148* 160   Basic Metabolic Panel Recent Labs    83/41/96 0117 09/09/20 0813 09/10/20 0116  NA 141 137 138  K 3.8 3.9 4.2  CL 97* 98 100  CO2 35* 31 30  GLUCOSE 96 184* 141*  BUN 11 11 13   CREATININE 1.47* 1.46* 1.57*  CALCIUM 8.4* 8.2* 8.4*  MG 1.8  --  2.2   Liver Function Tests Recent Labs     09/09/20 0117  AST 43*  ALT 33  ALKPHOS 141*  BILITOT 0.9  PROT 5.6*  ALBUMIN 2.8*   No results for input(s): LIPASE, AMYLASE in the last 72 hours. Cardiac Enzymes No results for input(s): CKTOTAL, CKMB, CKMBINDEX, TROPONINI in the last 72 hours.  BNP: BNP (last 3 results) Recent Labs    09/10/20 0116  BNP 2,079.7*    ProBNP (last 3 results) No results for input(s): PROBNP in the last 8760 hours.   D-Dimer No results for input(s): DDIMER in the last 72 hours. Hemoglobin A1C Recent Labs    09/09/20 0117  HGBA1C 6.6*   Fasting Lipid Panel No results for input(s): CHOL, HDL, LDLCALC, TRIG, CHOLHDL, LDLDIRECT in the last 72 hours. Thyroid Function Tests No results for input(s): TSH, T4TOTAL, T3FREE, THYROIDAB in the last 72 hours.  Invalid input(s): FREET3  Other results:   Imaging    No results found.   Medications:     Scheduled Medications:  aspirin EC  81 mg Oral Daily   carvedilol  18.75 mg Oral BID WC   dapagliflozin propanediol  10 mg Oral Daily   digoxin  0.125 mg Oral Daily   folic acid  1 mg Oral Daily  heparin  5,000 Units Subcutaneous Q8H   hydrALAZINE  75 mg Oral BID   insulin aspart  0-15 Units Subcutaneous Q4H   isosorbide mononitrate  60 mg Oral Daily   levothyroxine  25 mcg Oral Q0600   losartan  50 mg Oral Daily   multivitamin with minerals  1 tablet Oral Daily   naproxen  500 mg Oral BID WC   potassium chloride  40 mEq Oral Daily   spironolactone  25 mg Oral Daily   thiamine  100 mg Oral Daily   torsemide  40 mg Oral Daily    Infusions:   PRN Medications: benzonatate, clonazePAM, docusate sodium, melatonin, polyethylene glycol     Assessment/Plan   1. VT/VF: Multiple shocks in setting of ETOH binge with low K and Mg.  Has [employer] ICD.  Electrolytes replaced.  No further VT.  HS-TnI mildly elevated without trend. - Continue Coreg 18.75 mg bid. - Not on amiodarone with initial markedly prolonged QT  interval. - Ischemia unlikely, has long history of NICM.   2. Acute on chronic systolic CHF: Biventricular failure, followed in [place] in the past.  Echoes have shown EF up and down, appears to fluctuate with medication compliance and likely with ETOH intake. Echo this admission with EF 10%, severe LV dilation, severely reduced RV systolic function, mild MR.  Reports prior cath without CAD though I cannot find report.  He had a normal nuclear stress test in the past as well. Cardiomyopathy is likely due to heavy ETOH abuse (2 gallons/day vodka currently) and possibly poor HTN control.  He has diuresed well, weight down > 20 lbs. No more than mild volume overload on exam today, creatinine mildly higher at 1.57.  - Transition to torsemide 40 mg daily.  - No ACEI or ARNI due to angioedema with ACEI. - Continue losartan 50 mg daily. - Continue Coreg 18.75 mg bid. - Increase hydralazine to 75 bid + Imdur 60. - Spironolactone 25 daily. - Continue Farxiga 10 mg daily. - Continue digoxin 0.125 daily. - Imperative to stop ETOH. 3. ETOH abuse: Has finished phenobarbital for withdrawal. - Strongly urged to quit. 4. HTN: BP control with the above meds. 5. Smoking: Enouraged to quit.  6. Disposition: I think he can go home today.  Will need help with meds (discussed with pharmacy).  Will need followup in CHF clinic. Cardiac meds for discharge: torsemide 40 mg daily, Coreg 18.75 bid, dapagliflozin 10 daily, digoxin 0.125 daily, hydralazine 75 tid, Imdur 60 daily, losartan 50 daily, spironolactone 25 daily.   Marca Ancona 09/10/2020 8:56 AM

## 2020-09-10 NOTE — Progress Notes (Signed)
Pt provided with verbal d/c instructions. RN answered all questions. Patient provided with paper copy of discharge . IV removed. Pt VSS at d/c. Transferred via wheelchair to [employer] entrance to private vehicle.

## 2020-09-10 NOTE — TOC Transition Note (Signed)
Transition of Care [facility]) - CM/SW Discharge Note   Patient Details  Name: Darryl Mitchell MRN: [id number] Date of Birth: [birth year]  [facility]) CM/SW Contact:  Leone Haven, RN Phone Number: 09/10/2020, 11:42 AM   Clinical Narrative:    NCM spoke with patient at the bedside, he would like to be set up with outpatient physical therapy on [facility].  NCM sent in referral thur epic for outpatient physical therapy. Listed on AVS.   Final next level of care: Home/Self Care Barriers to Discharge: No Barriers Identified   Patient Goals and CMS Choice Patient states their goals for this hospitalization and ongoing recovery are:: to return home with mother in [place]      Discharge Placement                       Discharge Plan and Services In-house Referral: Clinical Social Work Discharge Planning Services: CM Consult              DME Agency: NA       HH Arranged: NA          Social Determinants of Health (SDOH) Interventions Food Insecurity Interventions: Intervention Not Indicated Financial Strain Interventions: Other (Comment) (CSW encouraged pt. to call the number listed on his Medicare card to see about services in [place]) Housing Interventions: Intervention Not Indicated, Other (Comment) (Patient reports he is currently staying with his mother in [place] and his sister is here as well.) Transportation Interventions: Retail banker   Readmission Risk Interventions No flowsheet data found.

## 2020-09-13 ENCOUNTER — Telehealth: Payer: Self-pay | Admitting: Physician Assistant

## 2020-09-13 NOTE — Telephone Encounter (Signed)
Patient's mother contacted the after-hours answering service complaining of low blood pressure and dizziness after taking medication.  Systolic blood pressure earlier today was 85, they dropped down to 79 mmHg.  Recently had VT/VF, EF is about 10%.  Patient does not have any feeling of passing out.  I asked the family member to start holding hydralazine, Imdur and losartan.  He should continue on the carvedilol given the recent VT/VF.  I expect the blood pressure to begin to trend up after holding those 3 medications.  If systolic blood pressure trend up to greater than 100, he may resume losartan at half of previous dose which is 25 mg daily.  He will need to earlier heart failure office visit to readjust his medication.  Patient's mother is a retired Charity fundraiser and understand that if his blood pressure does not come up and dropped further into the 70s or if he has any worsening dizziness or feeling of passing out, he will need to call 911 immediately.

## 2020-09-14 ENCOUNTER — Other Ambulatory Visit (HOSPITAL_COMMUNITY): Payer: Self-pay | Admitting: *Deleted

## 2020-09-14 ENCOUNTER — Telehealth (HOSPITAL_COMMUNITY): Payer: Self-pay | Admitting: *Deleted

## 2020-09-14 MED ORDER — DAPAGLIFLOZIN PROPANEDIOL 10 MG PO TABS: 10.0000 mg | ORAL_TABLET | Freq: Every day | ORAL | 3 refills | Status: DC

## 2020-09-14 NOTE — Telephone Encounter (Signed)
Pts mother called she is a Engineer, civil (consulting) and said hes taking all meds, no shortness of breath, the reading was after meds but she called the after hours line and held imdur and hydralazine bp better 107/68 pt feels better. She did not hold losartan because systolic bp was greater than 100.   Per Shanda Bumps [place],FNP "decrease hydral to 50 bid and imdur to 30 daily. I'd like to keep him on losartan for his HF"

## 2020-09-14 NOTE — Telephone Encounter (Signed)
Mr stickler needs to be worked in with APP clinic this week to readjust his meds.  Important to see soon.

## 2020-09-15 MED ORDER — HYDRALAZINE HCL 25 MG PO TABS
50.0000 mg | ORAL_TABLET | Freq: Two times a day (BID) | ORAL | 1 refills | Status: DC
Start: 2020-09-15 — End: 2020-09-17

## 2020-09-15 MED ORDER — ISOSORBIDE MONONITRATE ER 60 MG PO TB24: 30.0000 mg | ORAL_TABLET | Freq: Every day | ORAL | 1 refills | Status: DC

## 2020-09-15 NOTE — Telephone Encounter (Signed)
Pts mother aware and agreeable with plan.

## 2020-09-17 ENCOUNTER — Telehealth: Payer: Self-pay

## 2020-09-17 ENCOUNTER — Encounter (HOSPITAL_COMMUNITY): Payer: Self-pay

## 2020-09-17 ENCOUNTER — Other Ambulatory Visit: Payer: Self-pay

## 2020-09-17 ENCOUNTER — Telehealth (HOSPITAL_COMMUNITY): Payer: Self-pay | Admitting: Pharmacy Technician

## 2020-09-17 ENCOUNTER — Ambulatory Visit (HOSPITAL_COMMUNITY)
Admission: RE | Admit: 2020-09-17 | Discharge: 2020-09-17 | Disposition: A | Discharge status: Home / Self Care | Payer: Medicare Other | Source: Ambulatory Visit | Attending: Cardiology | Admitting: Cardiology

## 2020-09-17 VITALS — BP 100/68 | HR 78 | Wt 197.6 lb

## 2020-09-17 DIAGNOSIS — Z95810 Presence of automatic (implantable) cardiac defibrillator: Secondary | ICD-10-CM | POA: Insufficient documentation

## 2020-09-17 DIAGNOSIS — F17210 Nicotine dependence, cigarettes, uncomplicated: Secondary | ICD-10-CM | POA: Insufficient documentation

## 2020-09-17 DIAGNOSIS — E119 Type 2 diabetes mellitus without complications: Secondary | ICD-10-CM | POA: Diagnosis not present

## 2020-09-17 DIAGNOSIS — Z7984 Long term (current) use of oral hypoglycemic drugs: Secondary | ICD-10-CM | POA: Diagnosis not present

## 2020-09-17 DIAGNOSIS — Z9114 Patient's other noncompliance with medication regimen: Secondary | ICD-10-CM | POA: Insufficient documentation

## 2020-09-17 DIAGNOSIS — I5042 Chronic combined systolic (congestive) and diastolic (congestive) heart failure: Secondary | ICD-10-CM | POA: Diagnosis present

## 2020-09-17 DIAGNOSIS — Z79899 Other long term (current) drug therapy: Secondary | ICD-10-CM | POA: Insufficient documentation

## 2020-09-17 DIAGNOSIS — F1010 Alcohol abuse, uncomplicated: Secondary | ICD-10-CM | POA: Diagnosis not present

## 2020-09-17 DIAGNOSIS — G4733 Obstructive sleep apnea (adult) (pediatric): Secondary | ICD-10-CM | POA: Diagnosis not present

## 2020-09-17 DIAGNOSIS — R9431 Abnormal electrocardiogram [ECG] [EKG]: Secondary | ICD-10-CM | POA: Diagnosis not present

## 2020-09-17 DIAGNOSIS — I110 Hypertensive heart disease with heart failure: Secondary | ICD-10-CM | POA: Diagnosis not present

## 2020-09-17 DIAGNOSIS — Z7982 Long term (current) use of aspirin: Secondary | ICD-10-CM | POA: Diagnosis not present

## 2020-09-17 DIAGNOSIS — I5082 Biventricular heart failure: Secondary | ICD-10-CM | POA: Diagnosis not present

## 2020-09-17 HISTORY — DX: Ventricular tachycardia, unspecified: I47.20

## 2020-09-17 HISTORY — DX: Alcohol abuse, uncomplicated: F10.10

## 2020-09-17 HISTORY — DX: Biventricular heart failure: I50.82

## 2020-09-17 HISTORY — DX: Ventricular tachycardia: I47.2

## 2020-09-17 LAB — BASIC METABOLIC PANEL
Anion gap: 11 (ref 5–15)
BUN: 37 mg/dL — ABNORMAL HIGH (ref 6–20)
CO2: 27 mmol/L (ref 22–32)
Calcium: 9.4 mg/dL (ref 8.9–10.3)
Chloride: 102 mmol/L (ref 98–111)
Creatinine, Ser: 1.51 mg/dL — ABNORMAL HIGH (ref 0.61–1.24)
GFR, Estimated: 58 mL/min — ABNORMAL LOW (ref >60)
Glucose, Bld: 109 mg/dL — ABNORMAL HIGH (ref 70–99)
Potassium: 3.9 mmol/L (ref 3.5–5.1)
Sodium: 140 mmol/L (ref 135–145)

## 2020-09-17 LAB — MAGNESIUM: Magnesium: 2.4 mg/dL (ref 1.7–2.4)

## 2020-09-17 LAB — DIGOXIN LEVEL: Digoxin Level: 0.9 ng/mL (ref 0.8–2.0)

## 2020-09-17 MED ORDER — TORSEMIDE 20 MG PO TABS: ORAL_TABLET | ORAL | 1 refills | Status: DC

## 2020-09-17 NOTE — Progress Notes (Signed)
Advanced Heart Failure Clinic Note   Referring Physician: PCP: Pcp, No PCP-Cardiologist: Dr. Shirlee Latch   Reason for Visit: [facility] F/u for Chronic Systolic HF and VT   HPI:  45 year old with history of NICM dating back to 2016 s/p SQ [facility] ICD, heavy ETOH abuse, DMII, OSA, HTN, and tobacco abuse.  Had been sober x 2 years but recently relapsed 2 months ago. Drinking 2 gallons of Vodka a day and poor compliance w/ home meds. Had been living in [place] but planning to permanently move to [place] to live w/ his mother.   He was recently admitted to [facility] 6/22. Brought in by EMS w/ ? seizure activity, VT/VF requiring external/internal shocks in the field.  EP consulted and felt VF  in the setting of ETOH abuse + hypokalemia/hypomagnesemia. K/Mag replaced. Initially on amio drip but later stopped due to QT prolongation. Required phenobarbital for ETOH withdrawal. HS Trop 09>811>914. Echo showed severely reduced LVEF at 10% and severely reduced RV systolic function. Ischemic CM felt to be unlikely.   Rhythm stabilized and he had no further VT/VF. He diuresed well w/ IV Lasix, diuresing 20 lb. Transitioned to PO torsemide. GDMT initiated w/ the exception ARNi given h/o angioedema w/ lisinopril. He was started on losartan. D/c wt 218 lb.    He presents to clinic today for post hospital f/u. Here w/ his mother. She is a Charity fundraiser and has been assisting w/ his medications. She has been watching him closely and says he has not had any access to alcohol post d/c. He also denies any further use. Has also quit smoking. Denies any withdrawal symptoms.   From a HF standpoint, he has required medication reduction due to low BP. He called after hours w/ complaints of dizziness and systolic BPs in the 70s. He was instructed to stop hydralazine and Imdur. He held losartan x 1 dose then resumed but divided dose up to 25 mg bid. BP has improved but remains soft, 100/68 in clinic today. He still has occasional positional  dizziness but overall much improved. No syncope/ near syncope. He has continued to lose wt post d/c, down an additional 20 lb from 218>>197 lb. He denies palpitations. No ICD shocks. He also denies orthopnea/PND. No Pulice. NYHA Class II. ReDs Clip 34%. Reports good UOP w/ torsemide and states that his urine has been clear.    Review of systems complete and found to be negative unless listed in HPI.      Past Medical History:  Diagnosis Date   Biventricular heart failure, NYHA class 2 (HCC)    CHF (congestive heart failure) (HCC)    ETOH abuse    Hypertension    VT (ventricular tachycardia) (HCC)     Current Outpatient Medications  Medication Sig Dispense Refill   aspirin 81 MG EC tablet Take 1 tablet (81 mg total) by mouth daily. Swallow whole. 30 tablet 11   carvedilol (COREG) 6.25 MG tablet Take 3 tablets (18.75 mg total) by mouth 2 (two) times daily with a meal. 90 tablet 1   dapagliflozin propanediol (FARXIGA) 10 MG TABS tablet Take 1 tablet (10 mg total) by mouth daily. 180 tablet 3   digoxin (LANOXIN) 0.125 MG tablet Take 1 tablet (0.125 mg total) by mouth daily. 30 tablet 1   folic acid (FOLVITE) 1 MG tablet Take 1 tablet (1 mg total) by mouth daily.     levothyroxine (SYNTHROID) 25 MCG tablet Take 25 mcg by mouth daily.  losartan (COZAAR) 50 MG tablet Take 1 tablet (50 mg total) by mouth daily. 30 tablet 1   Multiple Vitamin (MULTIVITAMIN WITH MINERALS) TABS tablet Take 1 tablet by mouth daily.     potassium chloride SA (KLOR-CON) 20 MEQ tablet Take 2 tablets (40 mEq total) by mouth daily. 30 tablet 0   thiamine 100 MG tablet Take 1 tablet (100 mg total) by mouth daily.     torsemide (DEMADEX) 20 MG tablet Take 2 tablets (40 mg total) by mouth daily. 60 tablet 1   No current facility-administered medications for this encounter.    Allergies  Allergen Reactions   Lisinopril Swelling      Social History   Socioeconomic History   Marital status: Divorced    Spouse  name: Not on file   Number of children: Not on file   Years of education: Not on file   Highest education level: Not on file  Occupational History   Not on file  Tobacco Use   Smoking status: Every Day    Packs/day: 3.00    Pack years: 0.00    Types: Cigarettes   Smokeless tobacco: Current  Substance and Sexual Activity   Alcohol use: Yes    Comment: 2 gallons of Vodka   Drug use: Not on file   Sexual activity: Not on file  Other Topics Concern   Not on file  Social History Narrative   Not on file   Social Determinants of Health   Financial Resource Strain: Medium Risk   Difficulty of Paying Living Expenses: Somewhat hard  Food Insecurity: No Food Insecurity   Worried About Running Out of Food in the Last Year: Never true   Ran Out of Food in the Last Year: Never true  Transportation Needs: Unmet Transportation Needs   Lack of Transportation (Medical): Yes   Lack of Transportation (Non-Medical): Yes  Physical Activity: Not on file  Stress: Not on file  Social Connections: Not on file  Intimate Partner Violence: Not on file     History reviewed. No pertinent family history.  Vitals:   09/17/20 1412  BP: 100/68  Pulse: 78  SpO2: 98%  Weight: 89.6 kg (197 lb 9.6 oz)     PHYSICAL EXAM: ReDs Clip 34%  General:  Well appearing. No respiratory difficulty HEENT: normal Neck: supple. no JVD. Carotids 2+ bilat; no bruits. No lymphadenopathy or thyromegaly appreciated. Cor: PMI nondisplaced. Regular rate & rhythm. No rubs, gallops or murmurs. Lungs: clear Abdomen: soft, nontender, nondistended. No hepatosplenomegaly. No bruits or masses. Good bowel sounds. Extremities: no cyanosis, clubbing, rash, edema + Rt hand deformity  Neuro: alert & oriented x 3, cranial nerves grossly intact. moves all 4 extremities w/o difficulty. Affect pleasant.  ECG: NSR 83 bpm, QT/QTc 406/ 477 ms    ASSESSMENT & PLAN:  Chronic Biventricular Heart Failure, NYHA Class II: Biventricular  failure, followed in [place] in the past.  Echoes have shown EF up and down, appears to fluctuate with medication compliance and likely with ETOH intake. Echo at [facility] 6/22 w/ EF 10%,  severe LV dilation, severely reduced RV systolic function, mild MR.  Self reports prior cath without CAD, though records are not available to [place] for review. No ischemic CP. Doing fairly well post d/c w/ the exception of low BP requiring discontinuation of Imdur and hydralazine. Wt is down 20 lb since d/c. Denies fatigue. No syncope/near syncope. BP 100/68. No further ICD shocks. ReDs clip  34%. NYHA Class II  -  reduce torsemide to alternating doses, 40 mg one day and 20 mg the next   - continue  losartan 25 mg bid. No Entresto given h/o angioedema w/ lisinopril   - continue Coreg 18.75 mg bid  - continue digoxin 0.125 mg daily. Check Dig level   - off Imdur/ hydralazine w/ low BP   - has SQ [employer] ICD. No further ICD shocks since hospitalization   - he reports he has refrained from ETOH intake. Continued avoidance is imperative. Reiterated this again today   - discussed low sodium diet and daily wts   2.   H/o VT/VF: recent admit 6/22 w/ VT/VF w/ multiple ICD shocks, in setting of ETOH intoxication, hypokalemia and hypomagnesia. Initially placed on amio but discontinued due to QT prolongation. Lytes supplemented and ? blocker therapy initiated resulting in stabilization w/ no further VT/VF during remainder of hospitalization. He has a SQ ICD and denies any further shocks. EKG today shows NSR. HR 83 bpm. QT/QTc 406/477 ms.   - continue Coreg 18.75 mg bid  - check BMP and Mg level today  1 - needs to refrain from ETOH    3.   H/o ETOH Abuse: h/o heavy use. Drinking 2 gallons of vodka/day prior to recent admit. He denies any further use post d/c  - reiterated the importance of abstinence given cardiac issues   4. H/o Noncompliance  - taking medications daily. Now living w/ his mother who is a Charity fundraiser and has been  assisting w/ meds  5. Type 2 DM  - recent Hgb A1c 6.6   - continue Farxiga   6. HTN:   - soft but stable. See recs above   F/u w/ APP in 3-4 weeks    Robbie Lis, PA-C 09/17/20

## 2020-09-17 NOTE — Progress Notes (Signed)
ReDS Vest / Clip - 09/17/20 1400       ReDS Vest / Clip   Station Marker C    Ruler Value 29    ReDS Value Range Low volume    ReDS Actual Value 34

## 2020-09-17 NOTE — Telephone Encounter (Signed)
Advanced Heart Failure Patient Advocate Encounter  Patient was approved to receive Farxiga from [employer]  Patient ID: [id number] Effective dates: 09/11/20 through 09/11/21  Called [employer] and spoke with rep regarding RX that was said to be incomplete. Jasmine, (CMA) sent over new eRX on 6/20. Representative confirmed that the eRX was received. It will likely be shipped out tomorrow.  Called and spoke with patient.  Archer Asa, CPhT

## 2020-09-17 NOTE — Patient Instructions (Addendum)
EKG done today.  Labs done today. We will contact you only if your labs are abnormal.  DECREASE Torsemide to 40mg (2 tablets) by  mouth daily alternating with 20mg  (1 tablet) by mouth daily.   No other medication changes were made. Please continue all current medications as prescribed.  Your physician recommends that you schedule a follow-up appointment in:    If you have any questions or concerns before your next appointment please send a message through [place] or call our office at [phone].    TO LEAVE A MESSAGE FOR THE NURSE SELECT OPTION 2, PLEASE LEAVE A MESSAGE INCLUDING: YOUR NAME DATE OF BIRTH CALL BACK NUMBER REASON FOR CALL**this is important as we prioritize the call backs  YOU WILL RECEIVE A CALL BACK THE SAME DAY AS LONG AS YOU CALL BEFORE 4:00 PM   Do the following things EVERYDAY: Weigh yourself in the morning before breakfast. Write it down and keep it in a log. Take your medicines as prescribed Eat low salt foods--Limit salt (sodium) to 2000 mg per day.  Stay as active as you can everyday Limit all fluids for the day to less than 2 liters   At the Advanced Heart Failure Clinic, you and your health needs are our priority. As part of our continuing mission to provide you with exceptional heart care, we have created designated Provider Care Teams. These Care Teams include your primary Cardiologist (physician) and Advanced Practice Providers (APPs- Physician Assistants and Nurse Practitioners) who all work together to provide you with the care you need, when you need it.   You may see any of the following providers on your designated Care Team at your next follow up: Dr [address] [phone], NP Arvilla Meres, Carron Curie Robbie Lis, PharmD   Please be sure to bring in all your medications bottles to every appointment.

## 2020-09-17 NOTE — Telephone Encounter (Signed)
   [name] DOB: [birth year] MRN: [id number]   RIDER WAIVER AND RELEASE OF LIABILITY  For purposes of improving physical access to our facilities, [facility] is pleased to partner with third parties to provide [facility] patients or other authorized individuals the option of convenient, on-demand ground transportation services (the Chiropractor") through use of the technology service that enables users to request on-demand ground transportation from independent third-party providers.  By opting to use and accept these [employer], I, the undersigned, hereby agree on behalf of myself, and on behalf of any minor child using the Science writer for whom I am the parent or legal guardian, as follows:  Science writer provided to me are provided by independent third-party transportation providers who are not [employer] or employees and who are unaffiliated with [employer]. [place] is neither a transportation carrier nor a common or public carrier. [facility] has no control over the quality or safety of the transportation that occurs as a result of the [employer]. [place] cannot guarantee that any third-party transportation provider will complete any arranged transportation service. [facility] makes no representation, warranty, or guarantee regarding the reliability, timeliness, quality, safety, suitability, or availability of any of the Transport Services or that they will be error free. I fully understand that traveling by vehicle involves risks and dangers of serious bodily injury, including permanent disability, paralysis, and death. I agree, on behalf of myself and on behalf of any minor child using the Transport Services for whom I am the parent or legal guardian, that the entire risk arising out of my use of the [employer] remains solely with me, to the maximum extent permitted under applicable law. The [employer] are provided "as is"  and "as available." [facility] disclaims all representations and warranties, express, implied or statutory, not expressly set out in these terms, including the implied warranties of merchantability and fitness for a particular purpose. I hereby waive and release [facility], its agents, employees, officers, directors, representatives, insurers, attorneys, assigns, successors, subsidiaries, and affiliates from any and all past, present, or future claims, demands, liabilities, actions, causes of action, or suits of any kind directly or indirectly arising from acceptance and use of the [employer]. I further waive and release [place] and its affiliates from all present and future liability and responsibility for any injury or death to persons or damages to property caused by or related to the use of the [employer]. I have read this Waiver and Release of Liability, and I understand the terms used in it and their legal significance. This Waiver is freely and voluntarily given with the understanding that my right (as well as the right of any minor child for whom I am the parent or legal guardian using the [employer]) to legal recourse against [facility] in connection with the [employer] is knowingly surrendered in return for use of these services.   I attest that I read the consent document to [name], gave Mr. Hove the opportunity to ask questions and answered the questions asked (if any). I affirm that [name] then provided consent for he's participation in this program.     Launa Grill

## 2020-09-20 ENCOUNTER — Emergency Department (HOSPITAL_COMMUNITY)
Admission: EM | Admit: 2020-09-20 | Discharge: 2020-09-21 | Disposition: A | Discharge status: Home / Self Care | Payer: Medicare Other | Attending: Emergency Medicine | Admitting: Emergency Medicine

## 2020-09-20 ENCOUNTER — Telehealth: Payer: Self-pay | Admitting: Cardiology

## 2020-09-20 ENCOUNTER — Other Ambulatory Visit: Payer: Self-pay

## 2020-09-20 DIAGNOSIS — I5043 Acute on chronic combined systolic (congestive) and diastolic (congestive) heart failure: Secondary | ICD-10-CM | POA: Diagnosis not present

## 2020-09-20 DIAGNOSIS — R42 Dizziness and giddiness: Secondary | ICD-10-CM | POA: Insufficient documentation

## 2020-09-20 DIAGNOSIS — I493 Ventricular premature depolarization: Secondary | ICD-10-CM

## 2020-09-20 DIAGNOSIS — I110 Hypertensive heart disease with heart failure: Secondary | ICD-10-CM | POA: Diagnosis not present

## 2020-09-20 DIAGNOSIS — Z7982 Long term (current) use of aspirin: Secondary | ICD-10-CM | POA: Diagnosis not present

## 2020-09-20 DIAGNOSIS — Z79899 Other long term (current) drug therapy: Secondary | ICD-10-CM | POA: Diagnosis not present

## 2020-09-20 DIAGNOSIS — F17210 Nicotine dependence, cigarettes, uncomplicated: Secondary | ICD-10-CM | POA: Diagnosis not present

## 2020-09-20 DIAGNOSIS — R002 Palpitations: Secondary | ICD-10-CM | POA: Diagnosis not present

## 2020-09-20 DIAGNOSIS — R079 Chest pain, unspecified: Secondary | ICD-10-CM | POA: Insufficient documentation

## 2020-09-20 LAB — URINALYSIS, ROUTINE W REFLEX MICROSCOPIC
Bacteria, UA: NONE SEEN
Bilirubin Urine: NEGATIVE
Glucose, UA: >=500 mg/dL — AB
Hgb urine dipstick: NEGATIVE
Ketones, ur: NEGATIVE mg/dL
Leukocytes,Ua: NEGATIVE
Nitrite: NEGATIVE
Protein, ur: NEGATIVE mg/dL
Specific Gravity, Urine: 1.007 (ref 1.005–1.030)
pH: 7 (ref 5.0–8.0)

## 2020-09-20 LAB — BASIC METABOLIC PANEL
Anion gap: 12 (ref 5–15)
BUN: 34 mg/dL — ABNORMAL HIGH (ref 6–20)
CO2: 25 mmol/L (ref 22–32)
Calcium: 9.2 mg/dL (ref 8.9–10.3)
Chloride: 97 mmol/L — ABNORMAL LOW (ref 98–111)
Creatinine, Ser: 1.48 mg/dL — ABNORMAL HIGH (ref 0.61–1.24)
GFR, Estimated: 59 mL/min — ABNORMAL LOW (ref >60)
Glucose, Bld: 184 mg/dL — ABNORMAL HIGH (ref 70–99)
Potassium: 3.1 mmol/L — ABNORMAL LOW (ref 3.5–5.1)
Sodium: 134 mmol/L — ABNORMAL LOW (ref 135–145)

## 2020-09-20 LAB — CBC
HCT: 48.9 % (ref 39.0–52.0)
Hemoglobin: 16.1 g/dL (ref 13.0–17.0)
MCH: 27.7 pg (ref 26.0–34.0)
MCHC: 32.9 g/dL (ref 30.0–36.0)
MCV: 84.2 fL (ref 80.0–100.0)
Platelets: 383 10*3/uL (ref 150–400)
RBC: 5.81 MIL/uL (ref 4.22–5.81)
RDW: 16.2 % — ABNORMAL HIGH (ref 11.5–15.5)
WBC: 10.4 10*3/uL (ref 4.0–10.5)
nRBC: 0 % (ref 0.0–0.2)

## 2020-09-20 LAB — I-STAT CHEM 8, ED
BUN: 33 mg/dL — ABNORMAL HIGH (ref 6–20)
Calcium, Ion: 1.11 mmol/L — ABNORMAL LOW (ref 1.15–1.40)
Chloride: 99 mmol/L (ref 98–111)
Creatinine, Ser: 1.4 mg/dL — ABNORMAL HIGH (ref 0.61–1.24)
Glucose, Bld: 183 mg/dL — ABNORMAL HIGH (ref 70–99)
HCT: 53 % — ABNORMAL HIGH (ref 39.0–52.0)
Hemoglobin: 18 g/dL — ABNORMAL HIGH (ref 13.0–17.0)
Potassium: 3 mmol/L — ABNORMAL LOW (ref 3.5–5.1)
Sodium: 137 mmol/L (ref 135–145)
TCO2: 26 mmol/L (ref 22–32)

## 2020-09-20 LAB — MAGNESIUM: Magnesium: 2 mg/dL (ref 1.7–2.4)

## 2020-09-20 LAB — TROPONIN I (HIGH SENSITIVITY): Troponin I (High Sensitivity): 18 ng/L — ABNORMAL HIGH (ref <18)

## 2020-09-20 LAB — CBG MONITORING, ED: Glucose-Capillary: 174 mg/dL — ABNORMAL HIGH (ref 70–99)

## 2020-09-20 NOTE — ED Triage Notes (Signed)
Pt BIB [facility] from home c/o new onset of dizziness and weakness. 1 episode of cheat pain earlier today that resolved and feeling of heart racing.   Pt has significant cardiac history. Arrested x2 last week. [employer] in place. Pt denies any firing. Denies CP/SOB/ NVD.   EMS VS 146/98 98% RA and HR 80 with frequent PVCs

## 2020-09-20 NOTE — Telephone Encounter (Signed)
Patient's mother called and said that her son's heart rate at times were in the 30's and then would go back up to the 70's.  His mom called EMS and EKG showed PVCs but his mother insisted that he go to [facility] er to be checked out and is on his way there now. He was really not symptomatic except for occasional dizziness. It appears that he had to cut back on BP meds recently due to soft BP.  It sounds like his dizziness is mainly positional .

## 2020-09-20 NOTE — ED Notes (Signed)
Called Local rep number for [employer] due to pt having an subq ICD. Spoke with Kennyth Arnold who states she is paging a local rep to come to the department and interrogate pt ICD. This RN gave phone number and will receive a call when a rep is on the way.

## 2020-09-20 NOTE — ED Notes (Signed)
Spoke with Darlene from [employer], she will be heading this way to interrogate ICD.

## 2020-09-20 NOTE — ED Provider Notes (Signed)
[facility] EMERGENCY DEPARTMENT Provider Note   CSN: [id number] Arrival date & time: 09/20/20  2218     History Chief Complaint  Patient presents with   Weakness   Dizziness    Darryl Mitchell is a 45 y.o. 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  The patient reports that he has been having intermittent palpitations for the last 2 to 3 days.  He describes the sensation as having his heartbeat hard and slow.  He reports that he has also been constantly lightheaded, feeling as if he might pass out.  States this began around the same time as when he began experiencing palpitations.  Lightheadedness has been gradually worsening since onset.  No known aggravating or alleviating factors, including standing, exertion, or ambulation  He reports that earlier today he had a brief "twinge" of chest pain with no recurrence.  He states that the only other symptom that he had today was that his vision felt very "hyper-surreal" multiple times, but states that the symptoms only lasted for about a second.  He reports that he has been extremely thirsty over the last few days.  He has a history of chronic shortness of breath, but no worsening dyspnea.  He also has a history of chronic headaches behind his left eye, that are unchanged in intensity or quality.  He denies cough, fever, chills, dizziness, syncope, vomiting, diarrhea, urinary frequency, polyuria, dysuria, abdominal pain, back pain, neck pain, or seizure-like activity.  He denies leg swelling or orthopnea.  Reports that he has been checking his weights daily.  He weighed 218 pounds when he was discharged from the hospital on June 16 and weight has decreased to 195/197 lbs.  He has been  compliant with his home medications.  Reports that he was supposed to have discontinued his home levothyroxine 2 days ago, but his mother reports that he has continued to receive the medication daily in error.  He was on hydralazine that was discontinued 1 month ago.  Most recent medication changes have included changing his 50 mg of losartan to 25 mg twice daily.  He was also previously on 40 mg of torsemide daily, but now alternates between 20 mg and 40 mg daily.  He took 20 mg today.  He reports that he has taken all of his home today except for his home carvedilol.  He has abstained from alcohol since his last admission.  The history is provided by the patient and medical records. No language interpreter was used.      Past Medical History:  Diagnosis Date   Biventricular heart failure, NYHA class 2 (HCC)    CHF (congestive heart failure) (HCC)    ETOH abuse    Hypertension    VT (ventricular tachycardia) (HCC)     Patient Active Problem List   Diagnosis Date Noted   Nonischemic cardiomyopathy (HCC) 09/09/2020   Biventricular heart failure (HCC) 09/09/2020   Anxiety, generalized 09/09/2020   Alcohol use disorder, severe, dependence (HCC) 09/09/2020   Acute on chronic combined systolic and diastolic CHF (congestive heart failure) (HCC)    Torsades de pointes (HCC) 09/03/2020    Past Surgical History:  Procedure Laterality Date   ICD IMPLANT         No family history on file.  Social History   Tobacco Use   Smoking status: Every Day    Packs/day: 3.00    Pack years: 0.00    Types: Cigarettes   Smokeless tobacco: Current  Substance Use Topics   Alcohol use: Yes    Comment: 2 gallons of Vodka    Home Medications Prior to Admission medications   Medication Sig Start Date End Date Taking? Authorizing Provider  aspirin 81 MG EC tablet Take 1 tablet (81 mg total) by mouth daily. Swallow whole. 09/10/20   Pennie Banter, DO  carvedilol (COREG) 6.25 MG tablet Take 3  tablets (18.75 mg total) by mouth 2 (two) times daily with a meal. 09/10/20   Esaw Grandchild A, DO  dapagliflozin propanediol (FARXIGA) 10 MG TABS tablet Take 1 tablet (10 mg total) by mouth daily. 09/14/20   Laurey Morale, MD  digoxin (LANOXIN) 0.125 MG tablet Take 1 tablet (0.125 mg total) by mouth daily. 09/10/20   Pennie Banter, DO  folic acid (FOLVITE) 1 MG tablet Take 1 tablet (1 mg total) by mouth daily. 09/10/20   Pennie Banter, DO  levothyroxine (SYNTHROID) 25 MCG tablet Take 25 mcg by mouth daily. 08/25/20   [provider]  losartan (COZAAR) 50 MG tablet Take 1 tablet (50 mg total) by mouth daily. 09/10/20   Pennie Banter, DO  Multiple Vitamin (MULTIVITAMIN WITH MINERALS) TABS tablet Take 1 tablet by mouth daily. 09/10/20   Pennie Banter, DO  potassium chloride SA (KLOR-CON) 20 MEQ tablet Take 2 tablets (40 mEq total) by mouth daily. 09/10/20   Esaw Grandchild A, DO  thiamine 100 MG tablet Take 1 tablet (100 mg total) by mouth daily. 09/10/20   Pennie Banter, DO  torsemide (DEMADEX) 20 MG tablet Take 2 tablets by mouth daily alternating with 1 tablet by mouth daily. 09/17/20   Allayne Butcher, PA-C    Allergies    Lisinopril  Review of Systems   Review of Systems  Constitutional:  Negative for appetite change, diaphoresis and fever.  HENT:  Negative for congestion and sore throat.   Eyes:  Positive for visual disturbance (several seconds of "hyper-surreal vision"). Negative for photophobia, pain and itching.  Respiratory:  Positive for shortness of breath (chronic). Negative for cough and chest tightness.   Cardiovascular:  Negative for chest pain.  Gastrointestinal:  Negative for abdominal pain, constipation, diarrhea, nausea and vomiting.  Endocrine: Positive for polydipsia. Negative for polyuria.  Genitourinary:  Negative for dysuria, frequency, hematuria and urgency.  Musculoskeletal:  Negative for back pain, myalgias, neck pain and neck stiffness.   Skin:  Negative for rash and wound.  Allergic/Immunologic: Negative for immunocompromised state.  Neurological:  Positive for light-headedness and headaches (chronic). Negative for seizures, syncope, weakness and numbness.  Psychiatric/Behavioral:  Negative for confusion.    Physical Exam Updated Vital Signs BP 107/85   Pulse (!) 57   Temp (!) 97.5 F (36.4 C) (Oral)   Resp (!) 28   Ht 6' (1.829 m)   Wt 89.4 kg   SpO2 98%   BMI 26.72 kg/m   Physical Exam Vitals and nursing note reviewed.  Constitutional:      General: He is not in acute distress.    Appearance: He is well-developed. He is obese. He is not ill-appearing, toxic-appearing or diaphoretic.  HENT:     Head: Normocephalic.     Mouth/Throat:     Mouth: Mucous membranes are moist.     Pharynx: No oropharyngeal  exudate or posterior oropharyngeal erythema.  Eyes:     Conjunctiva/sclera: Conjunctivae normal.  Cardiovascular:     Rate and Rhythm: Normal rate and regular rhythm.     Pulses: Normal pulses.     Heart sounds: Normal heart sounds. No murmur heard.   No friction rub. No gallop.  Pulmonary:     Effort: Pulmonary effort is normal. No respiratory distress.     Breath sounds: No stridor. No wheezing, rhonchi or rales.  Chest:     Chest wall: No tenderness.  Abdominal:     General: There is no distension.     Palpations: Abdomen is soft. There is no mass.     Tenderness: There is no abdominal tenderness. There is no right CVA tenderness, left CVA tenderness, guarding or rebound.     Hernia: No hernia is present.  Musculoskeletal:        General: No tenderness.     Cervical back: Neck supple.     Right lower leg: No edema.     Left lower leg: No edema.     Comments: No peripheral edema  Skin:    General: Skin is warm and dry.     Capillary Refill: Capillary refill takes less than 2 seconds.  Neurological:     Mental Status: He is alert.  Psychiatric:        Behavior: Behavior normal.    ED  Results / Procedures / Treatments   Labs (all labs ordered are listed, but only abnormal results are displayed) Labs Reviewed  CBC - Abnormal; Notable for the following components:      Result Value   RDW 16.2 (*)    All other components within normal limits  I-STAT CHEM 8, ED - Abnormal; Notable for the following components:   Potassium 3.0 (*)    BUN 33 (*)    Creatinine, Ser 1.40 (*)    Glucose, Bld 183 (*)    Calcium, Ion 1.11 (*)    Hemoglobin 18.0 (*)    HCT 53.0 (*)    All other components within normal limits  BASIC METABOLIC PANEL  URINALYSIS, ROUTINE W REFLEX MICROSCOPIC  DIGOXIN LEVEL  MAGNESIUM  CBG MONITORING, ED  TROPONIN I (HIGH SENSITIVITY)    EKG EKG Interpretation  Date/Time:  Sunday September 20 2020 22:27:23 EDT Ventricular Rate:  93 PR Interval:  191 QRS Duration: 101 QT Interval:  375 QTC Calculation: 352 R Axis:   -21 Text Interpretation: Sinus rhythm Ventricular bigeminy Inferior infarct, old Confirmed by Rees, Elizabeth ([id number]) on 09/20/2020 10:28:56 PM  Radiology No results found.  Procedures Procedures   Medications Ordered in ED Medications - No data to display  ED Course  I have reviewed the triage vital signs and the nursing notes.  Pertinent labs & imaging results that were available during my care of the patient were reviewed by me and considered in my medical decision making (see chart for details).    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  He had a brief episode of chest pain earlier today.  Without recurrence.  He has a history of chronic  shortness of breath and headaches with no change in intensity or  quality.  He has been diuresed approximately 23-25 lbs since he was discharged on June 16.  He reports significantly increased thirst over the last few days.  Vital signs are stable.  Reviewed EMS EKG strips.  One of the 2 strips did show bigeminy.  The second strip did show frequent PVCs, but not in a bigeminy pattern.  No torsades.   Per chart review, the patient's mother spoke with Dr. Mayford Knife, cardiology earlier this evening.  Per Dr. Norris Cross note, his mother was saying that his heart rate was in the 30s and that would go back into the 70s.   Labs have otherwise been reviewed and independently interpreted by me. Ventricular bigeminy noted on EKG in the ED. Delta troponin is stable.  Significantly improved from previous.  BNP is not elevated.  Creatinine is stable.  UA is not concerning for infection.  No metabolic derangements.  Patient's AICD was interrogated.  No events.  However, battery life was noted to be at 9%.  Discussed these findings with the patient and his mother and they were advised to follow-up with cardiology.  Discussed the patient with Dr. Judd Lien.  Suspect symptoms are due to PVCs.  Doubt ACS, aortic dissection, PE, esophageal rupture, tension pneumothorax, CVA, congestive heart failure exacerbation.  The patient is hemodynamically stable in no acute distress.  Safe for discharge home with outpatient follow-up to cardiology.  Final Clinical Impression(s) / ED Diagnoses Final diagnoses:  None    Rx / DC Orders ED Discharge Orders     None        Barkley Boards, PA-C 09/21/20 0330    Geoffery Lyons, MD 09/22/20 [phone]

## 2020-09-21 ENCOUNTER — Telehealth (HOSPITAL_COMMUNITY): Payer: Self-pay

## 2020-09-21 DIAGNOSIS — I4721 Torsades de pointes: Secondary | ICD-10-CM

## 2020-09-21 DIAGNOSIS — I5042 Chronic combined systolic (congestive) and diastolic (congestive) heart failure: Secondary | ICD-10-CM

## 2020-09-21 LAB — TROPONIN I (HIGH SENSITIVITY): Troponin I (High Sensitivity): 17 ng/L (ref <18)

## 2020-09-21 MED ORDER — TORSEMIDE 20 MG PO TABS
20.0000 mg | ORAL_TABLET | Freq: Two times a day (BID) | ORAL | 11 refills | Status: DC
Start: 2020-09-21 — End: 2020-10-21

## 2020-09-21 NOTE — Telephone Encounter (Signed)
Patient advised and verbalized understanding. Med list updated to reflect changes.  ? ?

## 2020-09-21 NOTE — Telephone Encounter (Signed)
Patients mother called to report that the patient was seen in the ED yesterday (09/21/20) for pvc's. While there they also reported that the patients ICD has a battery life of 9% and that he may be a little over diuresed. Patient was seen in our office 09/18/20 and his torsemide was decreased to 40mg  daily alternating with 20mg  qd. Please advise if any other changes are needed to be made. Will place a referral to EP so they can change patients battery.

## 2020-09-21 NOTE — Discharge Instructions (Addendum)
Thank you for allowing me to care for you today in the Emergency Department.   Your work-up today was overall reassuring.  You are having PVCs on your EKG, which we discussed.  You can follow-up with cardiology in the clinic.  Please make sure to let them know that your AICD battery is at 9%.  Return to the emergency department if you pass out, if you develop respiratory distress, constant, worsening chest pain that is worse with exertion, sweating, or other new, concerning symptoms.

## 2020-09-21 NOTE — ED Notes (Signed)
Discharge instructions discussed with pt. Pt verbalized understanding with no questions at this time.  

## 2020-09-21 NOTE — Addendum Note (Signed)
Addended by: Rob Bunting R on: 09/21/2020 03:34 PM   Modules accepted: Orders

## 2020-09-22 ENCOUNTER — Encounter (HOSPITAL_COMMUNITY): Payer: Self-pay | Admitting: *Deleted

## 2020-09-22 NOTE — Progress Notes (Signed)
Pt's mother, Euel Castile, is requesting FMLA to cover her absence from work, she is returning to work today and then will need intermittent leave to bring him to appts and during any flare-ups. Forms completed, signed by Dr Shirlee Latch and faxed to [employer] at [phone], pt's mother aware this has been done and copy mailed to her

## 2020-09-23 ENCOUNTER — Encounter (HOSPITAL_COMMUNITY): Payer: Medicare Other

## 2020-09-25 ENCOUNTER — Other Ambulatory Visit (HOSPITAL_COMMUNITY): Payer: Self-pay | Admitting: *Deleted

## 2020-09-25 DIAGNOSIS — I5042 Chronic combined systolic (congestive) and diastolic (congestive) heart failure: Secondary | ICD-10-CM

## 2020-09-25 MED ORDER — POTASSIUM CHLORIDE CRYS ER 20 MEQ PO TBCR: 40.0000 meq | EXTENDED_RELEASE_TABLET | Freq: Every day | ORAL | 6 refills | Status: DC

## 2020-09-30 ENCOUNTER — Other Ambulatory Visit: Payer: Self-pay

## 2020-09-30 ENCOUNTER — Ambulatory Visit (HOSPITAL_COMMUNITY)
Admission: RE | Admit: 2020-09-30 | Discharge: 2020-09-30 | Disposition: A | Discharge status: Home / Self Care | Payer: Medicare Other | Source: Ambulatory Visit | Attending: Cardiology | Admitting: Cardiology

## 2020-09-30 DIAGNOSIS — I5042 Chronic combined systolic (congestive) and diastolic (congestive) heart failure: Secondary | ICD-10-CM | POA: Insufficient documentation

## 2020-09-30 LAB — DIGOXIN LEVEL: Digoxin Level: 0.8 ng/mL (ref 0.8–2.0)

## 2020-10-09 ENCOUNTER — Other Ambulatory Visit (HOSPITAL_COMMUNITY): Payer: Self-pay | Admitting: *Deleted

## 2020-10-09 MED ORDER — LOSARTAN POTASSIUM 25 MG PO TABS: 25.0000 mg | ORAL_TABLET | Freq: Two times a day (BID) | ORAL | 3 refills | Status: DC

## 2020-10-09 MED ORDER — ASPIRIN 81 MG PO TBEC: 81.0000 mg | DELAYED_RELEASE_TABLET | Freq: Every day | ORAL | 11 refills | Status: DC

## 2020-10-09 MED ORDER — CARVEDILOL 6.25 MG PO TABS: 18.7500 mg | ORAL_TABLET | Freq: Two times a day (BID) | ORAL | 1 refills | Status: DC

## 2020-10-09 MED ORDER — DIGOXIN 125 MCG PO TABS: 0.1250 mg | ORAL_TABLET | Freq: Every day | ORAL | 1 refills | Status: DC

## 2020-10-17 ENCOUNTER — Other Ambulatory Visit (HOSPITAL_COMMUNITY): Payer: Self-pay | Admitting: Cardiology

## 2020-10-21 ENCOUNTER — Other Ambulatory Visit: Payer: Self-pay

## 2020-10-21 ENCOUNTER — Encounter (HOSPITAL_COMMUNITY): Payer: Self-pay

## 2020-10-21 ENCOUNTER — Ambulatory Visit (HOSPITAL_COMMUNITY)
Admission: RE | Admit: 2020-10-21 | Discharge: 2020-10-21 | Disposition: A | Discharge status: Home / Self Care | Payer: Medicare Other | Source: Ambulatory Visit | Attending: Family Medicine | Admitting: Family Medicine

## 2020-10-21 VITALS — BP 137/70 | HR 73 | Wt 212.8 lb

## 2020-10-21 DIAGNOSIS — I110 Hypertensive heart disease with heart failure: Secondary | ICD-10-CM | POA: Insufficient documentation

## 2020-10-21 DIAGNOSIS — Z888 Allergy status to other drugs, medicaments and biological substances status: Secondary | ICD-10-CM | POA: Insufficient documentation

## 2020-10-21 DIAGNOSIS — G4733 Obstructive sleep apnea (adult) (pediatric): Secondary | ICD-10-CM | POA: Diagnosis not present

## 2020-10-21 DIAGNOSIS — I5082 Biventricular heart failure: Secondary | ICD-10-CM | POA: Diagnosis not present

## 2020-10-21 DIAGNOSIS — E119 Type 2 diabetes mellitus without complications: Secondary | ICD-10-CM

## 2020-10-21 DIAGNOSIS — Z7984 Long term (current) use of oral hypoglycemic drugs: Secondary | ICD-10-CM | POA: Diagnosis not present

## 2020-10-21 DIAGNOSIS — Z7982 Long term (current) use of aspirin: Secondary | ICD-10-CM | POA: Insufficient documentation

## 2020-10-21 DIAGNOSIS — F1011 Alcohol abuse, in remission: Secondary | ICD-10-CM | POA: Insufficient documentation

## 2020-10-21 DIAGNOSIS — Z95810 Presence of automatic (implantable) cardiac defibrillator: Secondary | ICD-10-CM | POA: Diagnosis not present

## 2020-10-21 DIAGNOSIS — Z8679 Personal history of other diseases of the circulatory system: Secondary | ICD-10-CM | POA: Diagnosis not present

## 2020-10-21 DIAGNOSIS — I10 Essential (primary) hypertension: Secondary | ICD-10-CM

## 2020-10-21 DIAGNOSIS — I5022 Chronic systolic (congestive) heart failure: Secondary | ICD-10-CM | POA: Diagnosis present

## 2020-10-21 DIAGNOSIS — Z9114 Patient's other noncompliance with medication regimen: Secondary | ICD-10-CM | POA: Diagnosis not present

## 2020-10-21 DIAGNOSIS — F1010 Alcohol abuse, uncomplicated: Secondary | ICD-10-CM

## 2020-10-21 DIAGNOSIS — Z91148 Patient's other noncompliance with medication regimen for other reason: Secondary | ICD-10-CM

## 2020-10-21 DIAGNOSIS — F17210 Nicotine dependence, cigarettes, uncomplicated: Secondary | ICD-10-CM | POA: Insufficient documentation

## 2020-10-21 DIAGNOSIS — Z79899 Other long term (current) drug therapy: Secondary | ICD-10-CM | POA: Diagnosis not present

## 2020-10-21 DIAGNOSIS — I5042 Chronic combined systolic (congestive) and diastolic (congestive) heart failure: Secondary | ICD-10-CM | POA: Diagnosis not present

## 2020-10-21 DIAGNOSIS — Z596 Low income: Secondary | ICD-10-CM | POA: Insufficient documentation

## 2020-10-21 LAB — LIPID PANEL
Cholesterol: 254 mg/dL — ABNORMAL HIGH (ref 0–200)
HDL: 54 mg/dL (ref >40)
LDL Cholesterol: 172 mg/dL — ABNORMAL HIGH (ref 0–99)
Total CHOL/HDL Ratio: 4.7 RATIO
Triglycerides: 139 mg/dL (ref <150)
VLDL: 28 mg/dL (ref 0–40)

## 2020-10-21 LAB — BASIC METABOLIC PANEL
Anion gap: 8 (ref 5–15)
BUN: 12 mg/dL (ref 6–20)
CO2: 32 mmol/L (ref 22–32)
Calcium: 9 mg/dL (ref 8.9–10.3)
Chloride: 99 mmol/L (ref 98–111)
Creatinine, Ser: 1.26 mg/dL — ABNORMAL HIGH (ref 0.61–1.24)
GFR, Estimated: >60 mL/min (ref >60)
Glucose, Bld: 90 mg/dL (ref 70–99)
Potassium: 3.4 mmol/L — ABNORMAL LOW (ref 3.5–5.1)
Sodium: 139 mmol/L (ref 135–145)

## 2020-10-21 LAB — DIGOXIN LEVEL: Digoxin Level: 0.5 ng/mL — ABNORMAL LOW (ref 0.8–2.0)

## 2020-10-21 LAB — MAGNESIUM: Magnesium: 2 mg/dL (ref 1.7–2.4)

## 2020-10-21 MED ORDER — TORSEMIDE 20 MG PO TABS: 40.0000 mg | ORAL_TABLET | Freq: Every day | ORAL | 11 refills | Status: DC

## 2020-10-21 NOTE — Progress Notes (Signed)
ReDS Vest / Clip - 10/21/20 1500       ReDS Vest / Clip   Station Marker C    Ruler Value 29.5    ReDS Value Range Moderate volume overload    ReDS Actual Value 38

## 2020-10-21 NOTE — Patient Instructions (Signed)
INCREASE Torsemide to 40 mg daily  Labs today We will only contact you if something comes back abnormal or we need to make some changes. Otherwise no news is good news!  Labs needed in 10-14 days  Your physician recommends that you schedule a follow-up appointment in: 4 weeks in the Advanced Practitioners (PA/NP) Clinic and in 8 weeks with Dr Shirlee Latch and echo  Your physician has requested that you have an echocardiogram. Echocardiography is a painless test that uses sound waves to create images of your heart. It provides your doctor with information about the size and shape of your heart and how well your heart's chambers and valves are working. This procedure takes approximately one hour. There are no restrictions for this procedure.  Do the following things EVERYDAY: Weigh yourself in the morning before breakfast. Write it down and keep it in a log. Take your medicines as prescribed Eat low salt foods--Limit salt (sodium) to 2000 mg per day.  Stay as active as you can everyday Limit all fluids for the day to less than 2 liters  milAt the [facility] Clinic, you and your health needs are our priority. As part of our continuing mission to provide you with exceptional heart care, we have created designated Provider Care Teams. These Care Teams include your primary Cardiologist (physician) and Advanced Practice Providers (APPs- Physician Assistants and Nurse Practitioners) who all work together to provide you with the care you need, when you need it.   You may see any of the following providers on your designated Care Team at your next follow up: Dr Arvilla Meres Dr Marca Ancona Dr Brandon Melnick, NP Robbie Lis, [place] Mikki Santee Karle Plumber, PharmD   Please be sure to bring in all your medications bottles to every appointment.   If you have any questions or concerns before your next appointment please send [place] a message through [place] or call our  office at [phone].    TO LEAVE A MESSAGE FOR THE NURSE SELECT OPTION 2, PLEASE LEAVE A MESSAGE INCLUDING: YOUR NAME DATE OF BIRTH CALL BACK NUMBER REASON FOR CALL**this is important as we prioritize the call backs  YOU WILL RECEIVE A CALL BACK THE SAME DAY AS LONG AS YOU CALL BEFORE 4:00 PM

## 2020-10-21 NOTE — Progress Notes (Signed)
Advanced [facility] Failure Clinic Note   PCP: Pcp, No HF Cardiologist: Dr. Shirlee Latch   Reason for Visit:  F/u for Chronic Systolic HF and VT   HPI: Darryl Mitchell is a 45 y.o. with history of NICM dating back to 2016 s/p SQ [facility] ICD, heavy ETOH abuse, DMII, OSA, HTN, and tobacco abuse.  Had been sober x 2 years but relapsed 4/22. He was drinking 2 gallons of vodka a day and poor compliance w/ home meds. Had been living in [place] but planning to permanently move to [place] to live w/ his mother.   He was admitted to [facility] 6/22 w/ ? seizure activity, VT/VF requiring external/internal shocks in the field.  EP consulted and felt VF in the setting of ETOH abuse + hypokalemia/hypomagnesemia. K/Mag replaced. Initially on amio drip but later stopped due to QT prolongation. Required phenobarbital for ETOH withdrawal. Echo showed severely reduced LVEF at 10% and severely reduced RV systolic function. Ischemic CM felt to be unlikely.   Rhythm stabilized and he had no further VT/VF. He diuresed well w/ IV Lasix, down 20 lb. Transitioned to PO torsemide. GDMT initiated w/ the exception ARNi given h/o angioedema w/ lisinopril. He was started on losartan. D/c wt 218 lb.   At his post hospital follow up, he presented with his mother (retired Charity fundraiser) who has been helping with his medications. Had low BP in 70's at home. Hydralazine and Imdur stopped, losartan held x 1 day, then resumed at divided dosing. NYHA II symptoms, volume stable. Torsemide was reduced.  Today he returns for HF follow up with his mother. Overall feeling fine. Eating and drinking more lately. Denies increasing SOB, CP, dizziness, edema, or PND/Orthopnea. Appetite ok. No fever or chills. Weight at home 211 pounds. Taking all medications. Had 2 beers a couple of days ago.  Review of systems complete and found to be negative unless listed in HPI.    Past Medical History:  Diagnosis Date   Biventricular heart failure, NYHA class 2 (HCC)    CHF  (congestive heart failure) (HCC)    ETOH abuse    Hypertension    VT (ventricular tachycardia) (HCC)    Current Outpatient Medications  Medication Sig Dispense Refill   aspirin 81 MG EC tablet Take 1 tablet (81 mg total) by mouth daily. Swallow whole. 30 tablet 11   carvedilol (COREG) 6.25 MG tablet TAKE 3 TABLETS (18.75 MG TOTAL) BY MOUTH 2 (TWO) TIMES DAILY WITH A MEAL. 90 tablet 1   dapagliflozin propanediol (FARXIGA) 10 MG TABS tablet Take 1 tablet (10 mg total) by mouth daily. 180 tablet 3   digoxin (LANOXIN) 0.125 MG tablet Take 1 tablet (0.125 mg total) by mouth daily. 30 tablet 1   folic acid (FOLVITE) 1 MG tablet Take 1 tablet (1 mg total) by mouth daily.     losartan (COZAAR) 25 MG tablet Take 1 tablet (25 mg total) by mouth in the morning and at bedtime. 60 tablet 3   Multiple Vitamin (MULTIVITAMIN WITH MINERALS) TABS tablet Take 1 tablet by mouth daily.     potassium chloride SA (KLOR-CON) 20 MEQ tablet Take 2 tablets (40 mEq total) by mouth daily. 60 tablet 6   thiamine 100 MG tablet Take 1 tablet (100 mg total) by mouth daily.     torsemide (DEMADEX) 20 MG tablet Take 1 tablet (20 mg total) by mouth 2 (two) times daily. 60 tablet 11   torsemide (DEMADEX) 20 MG tablet Take 20 mg by mouth. Patient  takes 40 mg once a day on Monday Wednesday Friday and Sundays. Patient also takes 20 mg once a day on Tuesday Thursday and Saturday.     No current facility-administered medications for this encounter.   Allergies  Allergen Reactions   Lisinopril Swelling    Social History   Socioeconomic History   Marital status: Divorced    Spouse name: Not on file   Number of children: Not on file   Years of education: Not on file   Highest education level: Not on file  Occupational History   Not on file  Tobacco Use   Smoking status: Every Day    Packs/day: 3.00    Types: Cigarettes   Smokeless tobacco: Current  Substance and Sexual Activity   Alcohol use: Yes    Comment: 2 gallons  of Vodka   Drug use: Not on file   Sexual activity: Not on file  Other Topics Concern   Not on file  Social History Narrative   Not on file   Social Determinants of Health   Financial Resource Strain: Medium Risk   Difficulty of Paying Living Expenses: Somewhat hard  Food Insecurity: No Food Insecurity   Worried About Running Out of Food in the Last Year: Never true   Ran Out of Food in the Last Year: Never true  Transportation Needs: Unmet Transportation Needs   Lack of Transportation (Medical): Yes   Lack of Transportation (Non-Medical): Yes  Physical Activity: Not on file  Stress: Not on file  Social Connections: Not on file  Intimate Partner Violence: Not on file   No family history on file.  BP 137/70   Pulse 73   Wt 96.5 kg (212 lb 12.8 oz)   SpO2 98%   BMI 28.86 kg/m   Wt Readings from Last 3 Encounters:  10/21/20 96.5 kg (212 lb 12.8 oz)  09/20/20 89.4 kg (197 lb)  09/17/20 89.6 kg (197 lb 9.6 oz)   PHYSICAL EXAM: ReDs Clip 38%  General:  NAD. No resp difficulty HEENT: Normal Neck: Supple. JVP 6-7. Carotids 2+ bilat; no bruits. No lymphadenopathy or thryomegaly appreciated. Cor: PMI nondisplaced. Regular rate & rhythm. No rubs, gallops or murmurs. Lungs: Clear Abdomen: Soft, nontender, nondistended. No hepatosplenomegaly. No bruits or masses. Good bowel sounds. Extremities: No cyanosis, clubbing, rash, edema Neuro: Alert & oriented x 3, cranial nerves grossly intact. Moves all 4 extremities w/o difficulty. Affect pleasant.  ECG: SR, lateral T wave inversions (personally reviewed).  ASSESSMENT & PLAN: 1.Chronic Biventricular Heart Failure, NYHA Class II: Biventricular failure, followed in [place] in the past.  Echoes have shown EF up and down, appears to fluctuate with medication compliance and ETOH intake. Echo at [facility] 6/22 w/ EF 10%, severe LV dilation, severely reduced RV systolic function, mild MR.  Self reports prior cath without CAD, though records are  not available to [place] for review. No ischemic CP. Doing fairly well. Stable NYHA II, he is mildly volume overloaded. ReDs clip 38%.  - Increase torsemide to 40 mg daily. BMET today, repeat 10 days. - Continue  losartan 25 mg bid. No Entresto given h/o angioedema w/ lisinopril . - Continue Coreg 18.75 mg bid. - Continue digoxin 0.125 mg daily. Check Dig level . - Off Imdur/ hydralazine w/ low BP.  - Has SQ [employer] ICD. No further ICD shocks since hospitalization  - Discussed low sodium diet and daily wts.  2.   H/o VT/VF: recent admit 6/22 w/ VT/VF w/ multiple ICD shocks,  in setting of ETOH intoxication, hypokalemia and hypomagnesia. Initially placed on amio but discontinued due to QT prolongation. Lytes supplemented and ? blocker therapy initiated resulting in stabilization w/ no further VT/VF during remainder of hospitalization. He has a SQ ICD and denies any further shocks. EKG today shows NSR. HR 67 bpm. QT/QTc 430/454 ms.  - Continue Coreg 18.75 mg bid. - Check BMET & Mag today. - Needs to completely abstain from ETOH.   3.   H/o ETOH Abuse: h/o heavy use. Drinking 2 gallons of vodka/day prior to recent admit. He has had 2 beers since discharge.  - Reiterated the importance of abstinence given cardiac issues  4. H/o Noncompliance: Taking medications daily. Now living w/ his mother who is a Charity fundraiser and has been assisting w/ meds. 5. Type 2 DM - Recent Hgb A1c 6.6  - Continue Farxiga. No GU symptoms. 6. HTN: Stable. See recs above. - Will check lipids today.   F/u w/ APP in 4 weeks and 8 weeks with Dr. Shirlee Latch + echo.  Anderson Malta Harrison, FNP 10/21/20

## 2020-10-22 ENCOUNTER — Telehealth (HOSPITAL_COMMUNITY): Payer: Self-pay | Admitting: Cardiology

## 2020-10-22 MED ORDER — ATORVASTATIN CALCIUM 80 MG PO TABS: 80.0000 mg | ORAL_TABLET | Freq: Every day | ORAL | 3 refills | Status: DC

## 2020-10-22 MED ORDER — POTASSIUM CHLORIDE CRYS ER 20 MEQ PO TBCR: 60.0000 meq | EXTENDED_RELEASE_TABLET | Freq: Every day | ORAL | 6 refills | Status: DC

## 2020-10-22 NOTE — Telephone Encounter (Signed)
Patient called.  Patient aware.[phone] (M)

## 2020-10-22 NOTE — Telephone Encounter (Signed)
-----   Message from Jacklynn Ganong, [place] sent at 10/21/2020  5:26 PM EDT ----- Potassium is low. Is he taking his 40 KCl daily? If so, please increase to 60 mEq daily. Total cholesterol and LDL elevated. Because of his diabetes, I recommend he start on atorvastatin 80 mg daily, if he is agreeable. We will recheck K level at next lab appt.

## 2020-10-27 ENCOUNTER — Other Ambulatory Visit (HOSPITAL_COMMUNITY): Payer: Self-pay | Admitting: Cardiology

## 2020-11-01 ENCOUNTER — Other Ambulatory Visit (HOSPITAL_COMMUNITY): Payer: Self-pay | Admitting: Cardiology

## 2020-11-02 ENCOUNTER — Other Ambulatory Visit (HOSPITAL_COMMUNITY): Payer: 59

## 2020-11-02 ENCOUNTER — Emergency Department (HOSPITAL_COMMUNITY)
Admission: EM | Admit: 2020-11-02 | Discharge: 2020-11-02 | Disposition: A | Discharge status: Home / Self Care | Payer: Medicare Other | Attending: Student | Admitting: Student

## 2020-11-02 ENCOUNTER — Emergency Department (HOSPITAL_COMMUNITY): Payer: Medicare Other

## 2020-11-02 DIAGNOSIS — F419 Anxiety disorder, unspecified: Secondary | ICD-10-CM | POA: Diagnosis not present

## 2020-11-02 DIAGNOSIS — Z5321 Procedure and treatment not carried out due to patient leaving prior to being seen by health care provider: Secondary | ICD-10-CM | POA: Diagnosis not present

## 2020-11-02 DIAGNOSIS — R531 Weakness: Secondary | ICD-10-CM | POA: Insufficient documentation

## 2020-11-02 DIAGNOSIS — R42 Dizziness and giddiness: Secondary | ICD-10-CM | POA: Insufficient documentation

## 2020-11-02 DIAGNOSIS — R0602 Shortness of breath: Secondary | ICD-10-CM | POA: Insufficient documentation

## 2020-11-02 LAB — TROPONIN I (HIGH SENSITIVITY)
Troponin I (High Sensitivity): 9 ng/L (ref <18)
Troponin I (High Sensitivity): 12 ng/L (ref <18)

## 2020-11-02 LAB — CBC
HCT: 45.2 % (ref 39.0–52.0)
Hemoglobin: 15.2 g/dL (ref 13.0–17.0)
MCH: 27.9 pg (ref 26.0–34.0)
MCHC: 33.6 g/dL (ref 30.0–36.0)
MCV: 83.1 fL (ref 80.0–100.0)
Platelets: 296 10*3/uL (ref 150–400)
RBC: 5.44 MIL/uL (ref 4.22–5.81)
RDW: 16.7 % — ABNORMAL HIGH (ref 11.5–15.5)
WBC: 8.7 10*3/uL (ref 4.0–10.5)
nRBC: 0 % (ref 0.0–0.2)

## 2020-11-02 LAB — BASIC METABOLIC PANEL
Anion gap: 12 (ref 5–15)
BUN: 13 mg/dL (ref 6–20)
CO2: 25 mmol/L (ref 22–32)
Calcium: 9 mg/dL (ref 8.9–10.3)
Chloride: 101 mmol/L (ref 98–111)
Creatinine, Ser: 1.5 mg/dL — ABNORMAL HIGH (ref 0.61–1.24)
GFR, Estimated: 58 mL/min — ABNORMAL LOW (ref >60)
Glucose, Bld: 81 mg/dL (ref 70–99)
Potassium: 3.7 mmol/L (ref 3.5–5.1)
Sodium: 138 mmol/L (ref 135–145)

## 2020-11-02 LAB — BRAIN NATRIURETIC PEPTIDE: B Natriuretic Peptide: 46.4 pg/mL (ref 0.0–100.0)

## 2020-11-02 NOTE — ED Provider Notes (Signed)
His lightheadednessEmergency Medicine Provider Triage Evaluation Note  Darryl Mitchell , a 45 y.o. male  was evaluated in triage.  Pt complains of shortness of breath and anxiety dizziness exacerbated by change of position.  All started today around 3 PM..  Review of Systems  Positive: Lightheadedness, dizziness, shortness of breath, weakness globally Negative: Focal weakness, chest pain, palpitations, nausea, vomiting, diarrhea  Physical Exam  BP 117/83   Pulse 89   Temp 99.6 F (37.6 C)   Resp 20   SpO2 100%  Gen:   Awake, no distress   Resp:  Normal effort  MSK:   Moves extremities without difficulty  Other:  RRR no M/R/G.  Lungs CTA B.  Patient is anxious.  PERRL, EOMI, coordination intact, patient is ambulatory  Medical Decision Making  Medically screening exam initiated at 5:51 PM.  Appropriate orders placed.  [name] was informed that the remainder of the evaluation will be completed by another provider, this initial triage assessment does not replace that evaluation, and the importance of remaining in the ED until their evaluation is complete.  This chart was dictated using voice recognition software, Dragon. Despite the best efforts of this provider to proofread and correct errors, errors may still occur which can change documentation meaning.    Sherrilee Gilles 11/02/20 1754    Glendora Score, MD 11/02/20 2201

## 2020-11-02 NOTE — ED Triage Notes (Signed)
Pt BIB [facility] c/o SOB since 1500 today, tingling to both hands and head. Pt states he just feels off. Hx CHF, lungs clear, SpO2 90 and above on room air. Hx anxiety. EKG unremarkable.

## 2020-11-02 NOTE — ED Notes (Signed)
Pt states that he is leaving due to wait times. Pt advised to return if symptoms worsen.

## 2020-11-04 ENCOUNTER — Ambulatory Visit (INDEPENDENT_AMBULATORY_CARE_PROVIDER_SITE_OTHER): Payer: Medicare Other | Admitting: Internal Medicine

## 2020-11-04 ENCOUNTER — Encounter: Payer: Self-pay | Admitting: Internal Medicine

## 2020-11-04 ENCOUNTER — Other Ambulatory Visit: Payer: Self-pay

## 2020-11-04 VITALS — BP 120/84 | HR 68 | Ht 72.0 in | Wt 220.6 lb

## 2020-11-04 DIAGNOSIS — I5042 Chronic combined systolic (congestive) and diastolic (congestive) heart failure: Secondary | ICD-10-CM

## 2020-11-04 DIAGNOSIS — I472 Ventricular tachycardia: Secondary | ICD-10-CM

## 2020-11-04 DIAGNOSIS — I4721 Torsades de pointes: Secondary | ICD-10-CM

## 2020-11-04 DIAGNOSIS — F419 Anxiety disorder, unspecified: Secondary | ICD-10-CM

## 2020-11-04 NOTE — Progress Notes (Signed)
PCP: Pcp, No Primary Cardiologist: Dr Shirlee Latch Primary EP: Dr Kern Reap is a 45 y.o. male who presents today for routine electrophysiology followup.  Since being in the hospital, the patient reports doing reasonably well.  He was hospitalized in June with hypokalemia in the setting of ETOh and torsades.  He received multiple ICD shocks at that time.  He has done well since.  Today, he denies symptoms of palpitations, chest pain, shortness of breath,  lower extremity edema, dizziness, presyncope, or syncope.  The patient is otherwise without complaint today.   Past Medical History:  Diagnosis Date   Biventricular heart failure, NYHA class 2 (HCC)    CHF (congestive heart failure) (HCC)    ETOH abuse    Hypertension    VT (ventricular tachycardia) (HCC)    Past Surgical History:  Procedure Laterality Date   ICD IMPLANT  05/18/2015   [employer] A219 Emblem MRI S-ICD implanted by Dr Mancel Bale in [facility]    ROS- all systems are reviewed and negatives except as per HPI above  Current Outpatient Medications  Medication Sig Dispense Refill   aspirin 81 MG EC tablet Take 1 tablet (81 mg total) by mouth daily. Swallow whole. 30 tablet 11   atorvastatin (LIPITOR) 80 MG tablet Take 1 tablet (80 mg total) by mouth daily. 90 tablet 3   carvedilol (COREG) 6.25 MG tablet TAKE 3 TABLETS (18.75 MG TOTAL) BY MOUTH 2 (TWO) TIMES DAILY WITH A MEAL. 90 tablet 1   dapagliflozin propanediol (FARXIGA) 10 MG TABS tablet Take 1 tablet (10 mg total) by mouth daily. 180 tablet 3   digoxin (LANOXIN) 0.125 MG tablet TAKE 1 TABLET BY MOUTH DAILY 90 tablet 1   folic acid (FOLVITE) 1 MG tablet Take 1 tablet (1 mg total) by mouth daily.     losartan (COZAAR) 25 MG tablet Take 1 tablet (25 mg total) by mouth in the morning and at bedtime. 60 tablet 3   Multiple Vitamin (MULTIVITAMIN WITH MINERALS) TABS tablet Take 1 tablet by mouth daily.     potassium chloride SA (KLOR-CON) 20 MEQ tablet  Take 3 tablets (60 mEq total) by mouth daily. 90 tablet 6   thiamine 100 MG tablet Take 1 tablet (100 mg total) by mouth daily.     torsemide (DEMADEX) 20 MG tablet Take 2 tablets (40 mg total) by mouth daily. 60 tablet 11   No current facility-administered medications for this visit.    Physical Exam: Vitals:   11/04/20 1030  BP: 120/84  Pulse: 68  SpO2: 98%  Weight: 220 lb 9.6 oz (100.1 kg)  Height: 6' (1.829 m)    GEN- The patient is well appearing, alert and oriented x 3 today.   Head- normocephalic, atraumatic Eyes-  Sclera clear, conjunctiva pink Ears- hearing intact Oropharynx- clear Lungs- Clear to ausculation bilaterally, normal work of breathing Heart- Regular rate and rhythm, no murmurs, rubs or gallops, PMI not laterally displaced GI- soft, NT, ND, + BS Extremities- no clubbing, cyanosis, or edema  Wt Readings from Last 3 Encounters:  11/04/20 220 lb 9.6 oz (100.1 kg)  10/21/20 212 lb 12.8 oz (96.5 kg)  09/20/20 197 lb (89.4 kg)    EKG tracing ordered today is personally reviewed and shows sinus rhythm 68 bpm, PR 204 msec, Qtc 444 msec  ICD interrogation- [employer] S-ICD is approaching ERI.  There is a battery depletion alert.  On discussion with [employer], replacement within 90 days  is advised.   Last VF therapy was 09/03/20  Assessment and Plan:  VF Recently admitted with torsades/ VF in the setting of ETOH and hypokalemi Will need to keep K replete. Avoid ETOH No driving x 6 months reinforced again today. His ICD is approaching ERI.  He has a battery depletion alert and Environmental manager recommends replacement within 90 days.  Per [employer] rep Juanda Bond, he will be eligible for warranty and unreimbursed medical coverage for the procedure.  Risks, benefits, and alternatives to S-ICD pulse generator replacement were discussed in detail today.  The patient understands that risks include but are not limited to  bleeding, infection, MI, stroke, death, inappropriate shocks, damage to his existing leads, and lead dislodgement and wishes to proceed.  We will therefore schedule the procedure at the next available time.  Dr Ladona Ridgel will perform the procedure.  Dr Ladona Ridgel also spoke at length with the patient today regarding the procedure. Per Dr Lubertha Basque request, we will plan to schedule with anesthesia.  2. Chronic systolic dysfunction/ nonischemic CM Euvolemic today Not a candidate for CRT Continue medical therapy  3. HTN Continue losartan 25mg  daily  4. Hypokalemia Labs from 11/02/20 reviewed He reports that his KDur was increased to TID from BID based on this result  5. ETOh Avoidance advised  Risks, benefits and potential toxicities for medications prescribed and/or refilled reviewed with patient today.   01/02/21 MD, [facility] 11/04/2020 11:04 AM

## 2020-11-04 NOTE — H&P (View-Only) (Signed)
PCP: Pcp, No Primary Cardiologist: Darryl Mitchell Primary EP: Darryl Mitchell is a 45 y.o. male who presents today for routine electrophysiology followup.  Since being in the hospital, the patient reports doing reasonably well.  He was hospitalized in June with hypokalemia in the setting of ETOh and torsades.  He received multiple ICD shocks at that time.  He has done well since.  Today, he denies symptoms of palpitations, chest pain, shortness of breath,  lower extremity edema, dizziness, presyncope, or syncope.  The patient is otherwise without complaint today.   Past Medical History:  Diagnosis Date   Biventricular heart failure, NYHA class 2 (HCC)    CHF (congestive heart failure) (HCC)    ETOH abuse    Hypertension    VT (ventricular tachycardia) (HCC)    Past Surgical History:  Procedure Laterality Date   ICD IMPLANT  05/18/2015   [employer] A219 Emblem MRI S-ICD implanted by Darryl Mitchell in [facility]    ROS- all systems are reviewed and negatives except as per HPI above  Current Outpatient Medications  Medication Sig Dispense Refill   aspirin 81 MG EC tablet Take 1 tablet (81 mg total) by mouth daily. Swallow whole. 30 tablet 11   atorvastatin (LIPITOR) 80 MG tablet Take 1 tablet (80 mg total) by mouth daily. 90 tablet 3   carvedilol (COREG) 6.25 MG tablet TAKE 3 TABLETS (18.75 MG TOTAL) BY MOUTH 2 (TWO) TIMES DAILY WITH A MEAL. 90 tablet 1   dapagliflozin propanediol (FARXIGA) 10 MG TABS tablet Take 1 tablet (10 mg total) by mouth daily. 180 tablet 3   digoxin (LANOXIN) 0.125 MG tablet TAKE 1 TABLET BY MOUTH DAILY 90 tablet 1   folic acid (FOLVITE) 1 MG tablet Take 1 tablet (1 mg total) by mouth daily.     losartan (COZAAR) 25 MG tablet Take 1 tablet (25 mg total) by mouth in the morning and at bedtime. 60 tablet 3   Multiple Vitamin (MULTIVITAMIN WITH MINERALS) TABS tablet Take 1 tablet by mouth daily.     potassium chloride SA (KLOR-CON) 20 MEQ tablet  Take 3 tablets (60 mEq total) by mouth daily. 90 tablet 6   thiamine 100 MG tablet Take 1 tablet (100 mg total) by mouth daily.     torsemide (DEMADEX) 20 MG tablet Take 2 tablets (40 mg total) by mouth daily. 60 tablet 11   No current facility-administered medications for this visit.    Physical Exam: Vitals:   11/04/20 1030  BP: 120/84  Pulse: 68  SpO2: 98%  Weight: 220 lb 9.6 oz (100.1 kg)  Height: 6' (1.829 m)    GEN- The patient is well appearing, alert and oriented x 3 today.   Head- normocephalic, atraumatic Eyes-  Sclera clear, conjunctiva pink Ears- hearing intact Oropharynx- clear Lungs- Clear to ausculation bilaterally, normal work of breathing Heart- Regular rate and rhythm, no murmurs, rubs or gallops, PMI not laterally displaced GI- soft, NT, ND, + BS Extremities- no clubbing, cyanosis, or edema  Wt Readings from Last 3 Encounters:  11/04/20 220 lb 9.6 oz (100.1 kg)  10/21/20 212 lb 12.8 oz (96.5 kg)  09/20/20 197 lb (89.4 kg)    EKG tracing ordered today is personally reviewed and shows sinus rhythm 68 bpm, PR 204 msec, Qtc 444 msec  ICD interrogation- [employer] S-ICD is approaching ERI.  There is a battery depletion alert.  On discussion with [employer], replacement within 90 days  is advised.   Last VF therapy was 09/03/20  Assessment and Plan:  VF Recently admitted with torsades/ VF in the setting of ETOH and hypokalemi Will need to keep K replete. Avoid ETOH No driving x 6 months reinforced again today. His ICD is approaching ERI.  He has a battery depletion alert and [employer] recommends replacement within 90 days.  Per [employer] rep Darryl Mitchell, he will be eligible for warranty and unreimbursed medical coverage for the procedure.  Risks, benefits, and alternatives to S-ICD pulse generator replacement were discussed in detail today.  The patient understands that risks include but are not limited to  bleeding, infection, MI, stroke, death, inappropriate shocks, damage to his existing leads, and lead dislodgement and wishes to proceed.  We will therefore schedule the procedure at the next available time.  Darryl Mitchell will perform the procedure.  Darryl Mitchell also spoke at length with the patient today regarding the procedure. Per Darryl Mitchell's request, we will plan to schedule with anesthesia.  2. Chronic systolic dysfunction/ nonischemic CM Euvolemic today Not a candidate for CRT Continue medical therapy  3. HTN Continue losartan 25mg daily  4. Hypokalemia Labs from 11/02/20 reviewed He reports that his KDur was increased to TID from BID based on this result  5. ETOh Avoidance advised  Risks, benefits and potential toxicities for medications prescribed and/or refilled reviewed with patient today.   Darryl Donahoo MD, FACC 11/04/2020 11:04 AM     

## 2020-11-04 NOTE — Patient Instructions (Addendum)
Medication Instructions:  Your physician recommends that you continue on your current medications as directed. Please refer to the Current Medication list given to you today.  Labwork: None ordered.  Testing/Procedures: None ordered.  Follow-Up: Your physician wants you to follow-up in: 2 months with Dr. Ladona Ridgel.    Any Other Special Instructions Will Be Listed Below (If Applicable).  If you need a refill on your cardiac medications before your next appointment, please call your pharmacy.

## 2020-11-05 ENCOUNTER — Telehealth: Payer: Self-pay | Admitting: *Deleted

## 2020-11-05 ENCOUNTER — Encounter: Payer: Self-pay | Admitting: *Deleted

## 2020-11-05 ENCOUNTER — Ambulatory Visit (HOSPITAL_COMMUNITY)
Admission: RE | Admit: 2020-11-05 | Discharge: 2020-11-05 | Disposition: A | Discharge status: Home / Self Care | Payer: Medicare Other | Source: Ambulatory Visit | Attending: Internal Medicine | Admitting: Internal Medicine

## 2020-11-05 DIAGNOSIS — I5042 Chronic combined systolic (congestive) and diastolic (congestive) heart failure: Secondary | ICD-10-CM

## 2020-11-05 LAB — BASIC METABOLIC PANEL
Anion gap: 6 (ref 5–15)
BUN: 13 mg/dL (ref 6–20)
CO2: 34 mmol/L — ABNORMAL HIGH (ref 22–32)
Calcium: 8.5 mg/dL — ABNORMAL LOW (ref 8.9–10.3)
Chloride: 99 mmol/L (ref 98–111)
Creatinine, Ser: 1.66 mg/dL — ABNORMAL HIGH (ref 0.61–1.24)
GFR, Estimated: 51 mL/min — ABNORMAL LOW (ref >60)
Glucose, Bld: 89 mg/dL (ref 70–99)
Potassium: 3.6 mmol/L (ref 3.5–5.1)
Sodium: 139 mmol/L (ref 135–145)

## 2020-11-05 NOTE — Telephone Encounter (Signed)
Patient picked ICD gen change out date and lab date. Will meet on lab day and give instructions and scrub.  Verbalized understanding.

## 2020-11-07 ENCOUNTER — Other Ambulatory Visit (HOSPITAL_COMMUNITY): Payer: Self-pay | Admitting: Cardiology

## 2020-11-15 NOTE — Progress Notes (Signed)
Advanced Heart Failure Clinic Note   PCP: Pcp, No HF Cardiologist: Dr. Shirlee Latch   Reason for Visit:  F/u for Chronic Systolic HF and VT   HPI: Darryl Mitchell is a 45 y.o. with history of NICM dating back to 2016 s/p SQ [facility] ICD, heavy ETOH abuse, DMII, OSA, HTN, and tobacco abuse.  Had been sober x 2 years but relapsed 4/22. He was drinking 2 gallons of vodka a day and poor compliance w/ home meds. Had been living in [place] but planning to permanently move to [place] to live w/ his mother.   He was admitted to [facility] 6/22 w/ ? seizure activity, VT/VF requiring external/internal shocks in the field.  EP consulted and felt VF in the setting of ETOH abuse + hypokalemia/hypomagnesemia. K/Mag replaced. Initially on amio drip but later stopped due to QT prolongation. Required phenobarbital for ETOH withdrawal. Echo showed severely reduced LVEF at 10% and severely reduced RV systolic function. Ischemic CM felt to be unlikely.   Rhythm stabilized and he had no further VT/VF. He diuresed well w/ IV Lasix, down 20 lb. Transitioned to PO torsemide. GDMT initiated w/ the exception ARNi given h/o angioedema w/ lisinopril. He was started on losartan. D/c wt 218 lb.   Today he returns for HF follow up here with his significant other. No SOB walking on flat ground or with stairs. Struggling with anxiety and panic attacks, occurring upwards of three times a day. Denies CP, dizziness, edema, or PND/Orthopnea. Weight at home 222 pounds. Taking all medications. Has been eating out a lot. He has resumed drinking alcohol, a few drinks a week. Has plans on making an appt soon for counseling.   Review of systems complete and found to be negative unless listed in HPI.    Past Medical History:  Diagnosis Date   Biventricular heart failure, NYHA class 2 (HCC)    CHF (congestive heart failure) (HCC)    ETOH abuse    Hypertension    VT (ventricular tachycardia) (HCC)    Current Outpatient Medications  Medication Sig  Dispense Refill   aspirin 81 MG EC tablet Take 1 tablet (81 mg total) by mouth daily. Swallow whole. 30 tablet 11   carvedilol (COREG) 6.25 MG tablet TAKE 3 TABLETS (18.75 MG TOTAL) BY MOUTH 2 (TWO) TIMES DAILY WITH A MEAL. 90 tablet 1   dapagliflozin propanediol (FARXIGA) 10 MG TABS tablet Take 1 tablet (10 mg total) by mouth daily. 180 tablet 3   digoxin (LANOXIN) 0.125 MG tablet TAKE 1 TABLET BY MOUTH DAILY 90 tablet 1   folic acid (FOLVITE) 1 MG tablet Take 1 tablet (1 mg total) by mouth daily.     losartan (COZAAR) 25 MG tablet Take 1 tablet (25 mg total) by mouth in the morning and at bedtime. 60 tablet 3   Multiple Vitamin (MULTIVITAMIN WITH MINERALS) TABS tablet Take 1 tablet by mouth daily.     potassium chloride SA (KLOR-CON) 20 MEQ tablet Take 3 tablets (60 mEq total) by mouth daily. 90 tablet 6   thiamine 100 MG tablet Take 1 tablet (100 mg total) by mouth daily.     torsemide (DEMADEX) 20 MG tablet Take 2 tablets (40 mg total) by mouth daily. 60 tablet 11   No current facility-administered medications for this encounter.   Allergies  Allergen Reactions   Lisinopril Swelling    Social History   Socioeconomic History   Marital status: Divorced    Spouse name: Not on file  Number of children: Not on file   Years of education: Not on file   Highest education level: Not on file  Occupational History   Not on file  Tobacco Use   Smoking status: Every Day    Packs/day: 3.00    Types: Cigarettes   Smokeless tobacco: Current  Substance and Sexual Activity   Alcohol use: Yes    Comment: 2 gallons of Vodka   Drug use: Not on file   Sexual activity: Not on file  Other Topics Concern   Not on file  Social History Narrative   Not on file   Social Determinants of Health   Financial Resource Strain: Medium Risk   Difficulty of Paying Living Expenses: Somewhat hard  Food Insecurity: No Food Insecurity   Worried About Running Out of Food in the Last Year: Never true   Ran  Out of Food in the Last Year: Never true  Transportation Needs: Unmet Transportation Needs   Lack of Transportation (Medical): Yes   Lack of Transportation (Non-Medical): Yes  Physical Activity: Not on file  Stress: Not on file  Social Connections: Not on file  Intimate Partner Violence: Not on file   No family history on file.  BP (!) 158/100   Pulse 71   Wt 106.1 kg (234 lb)   SpO2 99%   BMI 31.74 kg/m   Wt Readings from Last 3 Encounters:  11/16/20 106.1 kg (234 lb)  11/04/20 100.1 kg (220 lb 9.6 oz)  10/21/20 96.5 kg (212 lb 12.8 oz)   PHYSICAL EXAM: ReDs Clip 34%  General:  NAD. No resp difficulty HEENT: Normal Neck: Supple. No JVD. Carotids 2+ bilat; no bruits. No lymphadenopathy or thryomegaly appreciated. Cor: PMI nondisplaced. Regular rate & rhythm. No rubs, gallops or murmurs. Lungs: Clear Abdomen: Soft, nontender, nondistended. No hepatosplenomegaly. No bruits or masses. Good bowel sounds. Extremities: No cyanosis, clubbing, rash, edema Neuro: Alert & oriented x 3, cranial nerves grossly intact. Moves all 4 extremities w/o difficulty. Affect pleasant.  ECG: ST w/ PVC 104 bpm QT/QTc 350/460 ms (personally reviewed).  ASSESSMENT & PLAN: 1.Chronic Biventricular Heart Failure: Biventricular failure, followed in [place] in the past.  Echoes have shown EF up and down, appears to fluctuate with medication compliance and ETOH intake. Echo at [facility] 6/22 w/ EF 10%, severe LV dilation, severely reduced RV systolic function, mild MR.  Self reports prior cath without CAD, though records are not available to [place] for review. No ischemic CP. Stable NYHA II, he is not volume overloaded. ReDs clip 34%.  - Continue torsemide 40 mg daily. BMET today. - Continue  losartan 25 mg bid. No Entresto given h/o angioedema w/ lisinopril . - Continue Coreg 18.75 mg bid. - Continue Farxiga 10 mg daily. - Continue digoxin 0.125 mg daily. Check Dig level today. - Off Imdur/ hydralazine w/ low  previously BP.  - Has SQ [employer] ICD. No further ICD shocks since hospitalization.  - Discussed low sodium diet and daily wts.  2.   H/o VT/VF: recent admit 6/22 w/ VT/VF w/ multiple ICD shocks, in setting of ETOH intoxication, hypokalemia and hypomagnesia. Initially placed on amio but discontinued due to QT prolongation. Lytes supplemented and ? blocker therapy initiated resulting in stabilization w/ no further VT/VF during remainder of hospitalization. He has a SQ ICD and denies any further shocks. EKG today shows ST. HR 104 bpm. QT/QTc 350/460 ms. Planning for S-ICD pulse generator replacement with Dr. Ladona Ridgel next month. - Continue Coreg 18.75  mg bid. - Needs to completely abstain from ETOH.  - No driving for 6 months (~93/26). 3.   H/o ETOH Abuse: h/o heavy use. Drinking 2 gallons of vodka/day prior to recent admit.  - Reiterated the importance of abstinence given cardiac issues and discussed seeking professional help for this. 4. H/o Noncompliance: Taking medications daily. Now living w/ his mother who is a Charity fundraiser and has been assisting w/ meds.  - Unfortunately he is drinking alcohol again, although not at the previous quantity. This may prove difficult maintaining his electrolytes. 5. Type 2 DM: Recent Hgb A1c 6.6. - Continue Farxiga. No GU symptoms. 6. HTN: Elevated today, has not taken AM meds yet. - May need to add back Imdur/hydralazine if BP remains elevated. 7. HLD: LDL 172. He has not picked up his atorvastatin yet, encouraged him to do so. 8. Anxiety/panic: On-going issue. Having 3+ panic attacks a day. He tells me klonopin and alprazolam have helped in the past. - Start sertraline 25 mg daily w/ hydroxyzine 25 mg prn panic. Potential SE discussed. - Will need to establish with PCP. He was given a list of local PCPs today. - Encouraged counseling.   Follow up in 6 weeks with Dr. Shirlee Latch + echo  as scheduled.  Anderson Malta Cadott, FNP 11/16/20

## 2020-11-16 ENCOUNTER — Other Ambulatory Visit (HOSPITAL_COMMUNITY): Payer: Self-pay | Admitting: Cardiology

## 2020-11-16 ENCOUNTER — Encounter (HOSPITAL_COMMUNITY): Payer: Self-pay

## 2020-11-16 ENCOUNTER — Ambulatory Visit (HOSPITAL_COMMUNITY)
Admission: RE | Admit: 2020-11-16 | Discharge: 2020-11-16 | Disposition: A | Discharge status: Home / Self Care | Payer: Medicare Other | Source: Ambulatory Visit | Attending: Family Medicine | Admitting: Family Medicine

## 2020-11-16 ENCOUNTER — Other Ambulatory Visit: Payer: Self-pay

## 2020-11-16 VITALS — BP 158/100 | HR 71 | Wt 234.0 lb

## 2020-11-16 DIAGNOSIS — Z7984 Long term (current) use of oral hypoglycemic drugs: Secondary | ICD-10-CM | POA: Diagnosis not present

## 2020-11-16 DIAGNOSIS — Z7901 Long term (current) use of anticoagulants: Secondary | ICD-10-CM | POA: Diagnosis not present

## 2020-11-16 DIAGNOSIS — F1011 Alcohol abuse, in remission: Secondary | ICD-10-CM | POA: Diagnosis not present

## 2020-11-16 DIAGNOSIS — Z7982 Long term (current) use of aspirin: Secondary | ICD-10-CM | POA: Insufficient documentation

## 2020-11-16 DIAGNOSIS — I5042 Chronic combined systolic (congestive) and diastolic (congestive) heart failure: Secondary | ICD-10-CM

## 2020-11-16 DIAGNOSIS — F1010 Alcohol abuse, uncomplicated: Secondary | ICD-10-CM

## 2020-11-16 DIAGNOSIS — Z91148 Patient's other noncompliance with medication regimen for other reason: Secondary | ICD-10-CM

## 2020-11-16 DIAGNOSIS — E785 Hyperlipidemia, unspecified: Secondary | ICD-10-CM | POA: Diagnosis not present

## 2020-11-16 DIAGNOSIS — Z8679 Personal history of other diseases of the circulatory system: Secondary | ICD-10-CM | POA: Diagnosis not present

## 2020-11-16 DIAGNOSIS — I10 Essential (primary) hypertension: Secondary | ICD-10-CM

## 2020-11-16 DIAGNOSIS — I110 Hypertensive heart disease with heart failure: Secondary | ICD-10-CM | POA: Insufficient documentation

## 2020-11-16 DIAGNOSIS — Z79899 Other long term (current) drug therapy: Secondary | ICD-10-CM | POA: Diagnosis not present

## 2020-11-16 DIAGNOSIS — Z9119 Patient's noncompliance with other medical treatment and regimen: Secondary | ICD-10-CM | POA: Insufficient documentation

## 2020-11-16 DIAGNOSIS — E119 Type 2 diabetes mellitus without complications: Secondary | ICD-10-CM

## 2020-11-16 DIAGNOSIS — F410 Panic disorder [episodic paroxysmal anxiety] without agoraphobia: Secondary | ICD-10-CM | POA: Diagnosis not present

## 2020-11-16 DIAGNOSIS — Z9114 Patient's other noncompliance with medication regimen: Secondary | ICD-10-CM

## 2020-11-16 DIAGNOSIS — Z888 Allergy status to other drugs, medicaments and biological substances status: Secondary | ICD-10-CM | POA: Insufficient documentation

## 2020-11-16 DIAGNOSIS — F17210 Nicotine dependence, cigarettes, uncomplicated: Secondary | ICD-10-CM | POA: Diagnosis not present

## 2020-11-16 DIAGNOSIS — Z95810 Presence of automatic (implantable) cardiac defibrillator: Secondary | ICD-10-CM | POA: Diagnosis not present

## 2020-11-16 DIAGNOSIS — F419 Anxiety disorder, unspecified: Secondary | ICD-10-CM | POA: Diagnosis not present

## 2020-11-16 DIAGNOSIS — I5082 Biventricular heart failure: Secondary | ICD-10-CM | POA: Insufficient documentation

## 2020-11-16 LAB — BASIC METABOLIC PANEL
Anion gap: 7 (ref 5–15)
BUN: 13 mg/dL (ref 6–20)
CO2: 29 mmol/L (ref 22–32)
Calcium: 9.1 mg/dL (ref 8.9–10.3)
Chloride: 107 mmol/L (ref 98–111)
Creatinine, Ser: 1.29 mg/dL — ABNORMAL HIGH (ref 0.61–1.24)
GFR, Estimated: >60 mL/min (ref >60)
Glucose, Bld: 81 mg/dL (ref 70–99)
Potassium: 4.6 mmol/L (ref 3.5–5.1)
Sodium: 143 mmol/L (ref 135–145)

## 2020-11-16 LAB — DIGOXIN LEVEL: Digoxin Level: 0.6 ng/mL — ABNORMAL LOW (ref 0.8–2.0)

## 2020-11-16 MED ORDER — SERTRALINE HCL 25 MG PO TABS: ORAL_TABLET | ORAL | 2 refills | Status: DC

## 2020-11-16 MED ORDER — HYDRALAZINE HCL 25 MG PO TABS: 25.0000 mg | ORAL_TABLET | Freq: Four times a day (QID) | ORAL | 1 refills | Status: DC | PRN

## 2020-11-16 NOTE — Progress Notes (Signed)
ReDS Vest / Clip - 11/16/20 1100       ReDS Vest / Clip   Station Marker D    Ruler Value 33    ReDS Value Range Low volume    ReDS Actual Value 34

## 2020-11-16 NOTE — Patient Instructions (Addendum)
START Zoloft 25 mg, one tab daily for 4 weeks, then increase to 50 mg daily thereafter  START Hydroxyzine 25 mg, one tab every 6 hours as needed for anxiety   Labs today We will only contact you if something comes back abnormal or we need to make some changes. Otherwise no news is good news!  Your physician has recommended that you have a sleep study. This test records several body functions during sleep, including: brain activity, eye movement, oxygen and carbon dioxide blood levels, heart rate and rhythm, breathing rate and rhythm, the flow of air through your mouth and nose, snoring, body muscle movements, and chest and belly movement.  Keep folllow up as scheduled

## 2020-11-26 ENCOUNTER — Other Ambulatory Visit: Payer: Self-pay

## 2020-11-26 ENCOUNTER — Other Ambulatory Visit: Payer: Medicare Other

## 2020-11-26 DIAGNOSIS — I5042 Chronic combined systolic (congestive) and diastolic (congestive) heart failure: Secondary | ICD-10-CM

## 2020-11-26 LAB — CBC WITH DIFFERENTIAL/PLATELET
Basophils Absolute: 0 10*3/uL (ref 0.0–0.2)
Basos: 0 %
EOS (ABSOLUTE): 0.4 10*3/uL (ref 0.0–0.4)
Eos: 5 %
Hematocrit: 40.2 % (ref 37.5–51.0)
Hemoglobin: 13.9 g/dL (ref 13.0–17.7)
Lymphocytes Absolute: 2.6 10*3/uL (ref 0.7–3.1)
Lymphs: 32 %
MCH: 28.1 pg (ref 26.6–33.0)
MCHC: 34.6 g/dL (ref 31.5–35.7)
MCV: 81 fL (ref 79–97)
Monocytes Absolute: 0.5 10*3/uL (ref 0.1–0.9)
Monocytes: 6 %
Neutrophils Absolute: 4.6 10*3/uL (ref 1.4–7.0)
Neutrophils: 57 %
Platelets: 317 10*3/uL (ref 150–450)
RBC: 4.94 x10E6/uL (ref 4.14–5.80)
RDW: 18.3 % — ABNORMAL HIGH (ref 11.6–15.4)
WBC: 8.1 10*3/uL (ref 3.4–10.8)

## 2020-11-26 LAB — BASIC METABOLIC PANEL
BUN/Creatinine Ratio: 19 (ref 9–20)
BUN: 27 mg/dL — ABNORMAL HIGH (ref 6–24)
CO2: 23 mmol/L (ref 20–29)
Calcium: 9.7 mg/dL (ref 8.7–10.2)
Chloride: 106 mmol/L (ref 96–106)
Creatinine, Ser: 1.45 mg/dL — ABNORMAL HIGH (ref 0.76–1.27)
Glucose: 82 mg/dL (ref 65–99)
Potassium: 5.1 mmol/L (ref 3.5–5.2)
Sodium: 136 mmol/L (ref 134–144)
eGFR: 61 mL/min/{1.73_m2} (ref >59)

## 2020-11-29 ENCOUNTER — Other Ambulatory Visit (HOSPITAL_COMMUNITY): Payer: Self-pay | Admitting: Cardiology

## 2020-12-02 NOTE — Pre-Procedure Instructions (Signed)
Instructed patient on the following items: Arrival time 0530 Nothing to eat or drink after midnight No meds AM of procedure Responsible person to drive you home and stay with you for 24 hrs Wash with special soap night before and morning of procedure  

## 2020-12-02 NOTE — Anesthesia Preprocedure Evaluation (Addendum)
Anesthesia Evaluation  Patient identified by MRN, date of birth, ID band Patient awake    Reviewed: Allergy & Precautions, NPO status , Patient's Chart, lab work & pertinent test results, reviewed documented beta blocker date and time   History of Anesthesia Complications Negative for: history of anesthetic complications  Airway Mallampati: III  TM Distance: >3 FB Neck ROM: Full    Dental  (+) Dental Advisory Given, Chipped,    Pulmonary Current Smoker and Patient abstained from smoking.,    Pulmonary exam normal        Cardiovascular hypertension, Pt. on medications and Pt. on home beta blockers +CHF  Normal cardiovascular exam+ dysrhythmias Ventricular Tachycardia + Cardiac Defibrillator    '22 TTE - EF 10%. Global hypokinesis. The left ventricular internal cavity size was severely dilated. Grade III diastolic dysfunction (restrictive). Right ventricular systolic function is severely reduced. The right ventricular size is moderately enlarged. Left atrial size was mildly dilated. Mild MR. There is mild dilatation of the aortic root, measuring 39 mm.     Neuro/Psych PSYCHIATRIC DISORDERS Anxiety negative neurological ROS     GI/Hepatic negative GI ROS, (+)     substance abuse  alcohol use,   Endo/Other   Obesity   Renal/GU Renal InsufficiencyRenal disease     Musculoskeletal negative musculoskeletal ROS (+)   Abdominal   Peds  Hematology negative hematology ROS (+)   Anesthesia Other Findings   Reproductive/Obstetrics                            Anesthesia Physical Anesthesia Plan  ASA: 4  Anesthesia Plan: General   Post-op Pain Management:    Induction: Intravenous  PONV Risk Score and Plan: 2 and Ondansetron, Dexamethasone, Midazolam and Treatment may vary due to age or medical condition  Airway Management Planned: LMA  Additional Equipment: Arterial line  Intra-op Plan:    Post-operative Plan: Extubation in OR  Informed Consent: I have reviewed the patients History and Physical, chart, labs and discussed the procedure including the risks, benefits and alternatives for the proposed anesthesia with the patient or authorized representative who has indicated his/her understanding and acceptance.     Dental advisory given  Plan Discussed with: CRNA and Anesthesiologist  Anesthesia Plan Comments: (Have epi and levophed available in room)       Anesthesia Quick Evaluation

## 2020-12-03 ENCOUNTER — Ambulatory Visit (HOSPITAL_COMMUNITY)
Admission: RE | Admit: 2020-12-03 | Discharge: 2020-12-03 | Disposition: A | Discharge status: Home / Self Care | Payer: Medicare Other | Attending: Internal Medicine | Admitting: Internal Medicine

## 2020-12-03 ENCOUNTER — Encounter (HOSPITAL_COMMUNITY): Payer: Self-pay | Admitting: Internal Medicine

## 2020-12-03 ENCOUNTER — Ambulatory Visit (HOSPITAL_COMMUNITY): Payer: Medicare Other | Admitting: Anesthesiology

## 2020-12-03 ENCOUNTER — Other Ambulatory Visit: Payer: Self-pay

## 2020-12-03 ENCOUNTER — Ambulatory Visit (HOSPITAL_COMMUNITY)
Admission: RE | Disposition: A | Discharge status: Home / Self Care | Payer: Self-pay | Source: Home / Self Care | Attending: Internal Medicine

## 2020-12-03 DIAGNOSIS — Z4502 Encounter for adjustment and management of automatic implantable cardiac defibrillator: Secondary | ICD-10-CM | POA: Insufficient documentation

## 2020-12-03 DIAGNOSIS — I110 Hypertensive heart disease with heart failure: Secondary | ICD-10-CM | POA: Diagnosis not present

## 2020-12-03 DIAGNOSIS — Z7984 Long term (current) use of oral hypoglycemic drugs: Secondary | ICD-10-CM | POA: Insufficient documentation

## 2020-12-03 DIAGNOSIS — Z7289 Other problems related to lifestyle: Secondary | ICD-10-CM | POA: Insufficient documentation

## 2020-12-03 DIAGNOSIS — I428 Other cardiomyopathies: Secondary | ICD-10-CM | POA: Insufficient documentation

## 2020-12-03 DIAGNOSIS — I5082 Biventricular heart failure: Secondary | ICD-10-CM | POA: Insufficient documentation

## 2020-12-03 DIAGNOSIS — I472 Ventricular tachycardia: Secondary | ICD-10-CM | POA: Insufficient documentation

## 2020-12-03 DIAGNOSIS — Z79899 Other long term (current) drug therapy: Secondary | ICD-10-CM | POA: Insufficient documentation

## 2020-12-03 DIAGNOSIS — Z7982 Long term (current) use of aspirin: Secondary | ICD-10-CM | POA: Diagnosis not present

## 2020-12-03 DIAGNOSIS — I5022 Chronic systolic (congestive) heart failure: Secondary | ICD-10-CM | POA: Insufficient documentation

## 2020-12-03 DIAGNOSIS — E876 Hypokalemia: Secondary | ICD-10-CM | POA: Diagnosis not present

## 2020-12-03 HISTORY — PX: SUBQ ICD CHANGEOUT: EP1235

## 2020-12-03 SURGERY — SUBQ ICD CHANGEOUT
Anesthesia: General

## 2020-12-03 MED ORDER — FENTANYL CITRATE (PF) 250 MCG/5ML IJ SOLN
INTRAMUSCULAR | Status: DC | PRN
  Administered 2020-12-03 (×2): 50 ug via INTRAVENOUS

## 2020-12-03 MED ORDER — ACETAMINOPHEN 325 MG PO TABS: 325.0000 mg | ORAL_TABLET | ORAL | Status: DC | PRN

## 2020-12-03 MED ORDER — FENTANYL CITRATE (PF) 100 MCG/2ML IJ SOLN
INTRAMUSCULAR | Status: AC
  Filled 2020-12-03: qty 2

## 2020-12-03 MED ORDER — MIDAZOLAM HCL 5 MG/5ML IJ SOLN
INTRAMUSCULAR | Status: AC
  Filled 2020-12-03: qty 5

## 2020-12-03 MED ORDER — CHLORHEXIDINE GLUCONATE 4 % EX LIQD
4.0000 "application " | Freq: Once | CUTANEOUS | Status: DC
  Filled 2020-12-03: qty 60

## 2020-12-03 MED ORDER — SUGAMMADEX SODIUM 200 MG/2ML IV SOLN
INTRAVENOUS | Status: DC | PRN
  Administered 2020-12-03: 200 mg via INTRAVENOUS

## 2020-12-03 MED ORDER — CEFAZOLIN SODIUM-DEXTROSE 2-4 GM/100ML-% IV SOLN
INTRAVENOUS | Status: AC
  Filled 2020-12-03: qty 100

## 2020-12-03 MED ORDER — MIDAZOLAM HCL 5 MG/5ML IJ SOLN
INTRAMUSCULAR | Status: DC | PRN
  Administered 2020-12-03: 2 mg via INTRAVENOUS
  Administered 2020-12-03: 3 mg via INTRAVENOUS

## 2020-12-03 MED ORDER — SODIUM CHLORIDE 0.9 % IV SOLN: INTRAVENOUS | Status: DC

## 2020-12-03 MED ORDER — POVIDONE-IODINE 10 % EX SWAB: 2.0000 "application " | Freq: Once | CUTANEOUS | Status: DC

## 2020-12-03 MED ORDER — LIDOCAINE 2% (20 MG/ML) 5 ML SYRINGE
INTRAMUSCULAR | Status: DC | PRN
  Administered 2020-12-03: 60 mg via INTRAVENOUS

## 2020-12-03 MED ORDER — ONDANSETRON HCL 4 MG/2ML IJ SOLN
INTRAMUSCULAR | Status: DC | PRN
  Administered 2020-12-03: 4 mg via INTRAVENOUS

## 2020-12-03 MED ORDER — DEXAMETHASONE SODIUM PHOSPHATE 10 MG/ML IJ SOLN
INTRAMUSCULAR | Status: DC | PRN
  Administered 2020-12-03: 4 mg via INTRAVENOUS

## 2020-12-03 MED ORDER — HYDRALAZINE HCL 20 MG/ML IJ SOLN
10.0000 mg | Freq: Once | INTRAMUSCULAR | Status: AC
  Administered 2020-12-03: 10 mg via INTRAVENOUS

## 2020-12-03 MED ORDER — CEFAZOLIN SODIUM-DEXTROSE 2-4 GM/100ML-% IV SOLN
2.0000 g | INTRAVENOUS | Status: AC
  Administered 2020-12-03: 2 g via INTRAVENOUS

## 2020-12-03 MED ORDER — HYDRALAZINE HCL 20 MG/ML IJ SOLN
INTRAMUSCULAR | Status: AC
  Filled 2020-12-03: qty 1

## 2020-12-03 MED ORDER — SODIUM CHLORIDE 0.9 % IV SOLN: 250.0000 mL | INTRAVENOUS | Status: DC

## 2020-12-03 MED ORDER — LIDOCAINE HCL (PF) 1 % IJ SOLN
INTRAMUSCULAR | Status: DC | PRN
  Administered 2020-12-03: 60 mL

## 2020-12-03 MED ORDER — SODIUM CHLORIDE 0.9 % IV SOLN
80.0000 mg | INTRAVENOUS | Status: AC
  Administered 2020-12-03: 80 mg

## 2020-12-03 MED ORDER — PROPOFOL 10 MG/ML IV BOLUS
INTRAVENOUS | Status: DC | PRN
  Administered 2020-12-03: 50 mg via INTRAVENOUS
  Administered 2020-12-03: 30 mg via INTRAVENOUS

## 2020-12-03 MED ORDER — EPINEPHRINE HCL 5 MG/250ML IV SOLN IN NS
0.5000 ug/min | INTRAVENOUS | Status: DC
  Filled 2020-12-03: qty 250

## 2020-12-03 MED ORDER — SODIUM CHLORIDE 0.9 % IV SOLN
INTRAVENOUS | Status: AC
  Filled 2020-12-03: qty 2

## 2020-12-03 MED ORDER — ROCURONIUM BROMIDE 10 MG/ML (PF) SYRINGE
PREFILLED_SYRINGE | INTRAVENOUS | Status: DC | PRN
  Administered 2020-12-03: 60 mg via INTRAVENOUS

## 2020-12-03 MED ORDER — NOREPINEPHRINE 4 MG/250ML-% IV SOLN
2.0000 ug/min | INTRAVENOUS | Status: DC
  Administered 2020-12-03: 2 ug/min via INTRAVENOUS
  Filled 2020-12-03: qty 250

## 2020-12-03 MED ORDER — EPINEPHRINE 1 MG/10ML IJ SOSY
PREFILLED_SYRINGE | INTRAMUSCULAR | Status: DC | PRN
  Administered 2020-12-03: 20 ug via INTRAVENOUS
  Administered 2020-12-03: 10 ug via INTRAVENOUS

## 2020-12-03 SURGICAL SUPPLY — 6 items
CABLE SURGICAL S-101-97-12 (CABLE) ×2 IMPLANT
ICD SUBCU MRI EMBLEM A219 (ICD Generator) ×1 IMPLANT
MAT PREVALON FULL STRYKER (MISCELLANEOUS) ×1 IMPLANT
PAD PRO RADIOLUCENT 2001M-C (PAD) ×2 IMPLANT
SHEATH PROBE COVER 6X72 (BAG) ×1 IMPLANT
TRAY PACEMAKER INSERTION (PACKS) ×2 IMPLANT

## 2020-12-03 NOTE — Transfer of Care (Signed)
Immediate Anesthesia Transfer of Care Note  Patient: Darryl Mitchell  Procedure(s) Performed: Leontine Locket ICD CHANGEOUT  Patient Location: PACU and Cath Lab  Anesthesia Type:General  Level of Consciousness: awake, alert , oriented and patient cooperative  Airway & Oxygen Therapy: Patient Spontanous Breathing and Patient connected to nasal cannula oxygen  Post-op Assessment: Report given to RN and Post -op Vital signs reviewed and stable  Post vital signs: Reviewed and stable  Last Vitals:  Vitals Value Taken Time  BP 209/98 12/03/20 0928  Temp 36.7 C 12/03/20 0927  Pulse 73 12/03/20 0929  Resp 21 12/03/20 0929  SpO2 100 % 12/03/20 0929  Vitals shown include unvalidated device data.  Last Pain:  Vitals:   12/03/20 0927  TempSrc:   PainSc: 0-No pain      Patients Stated Pain Goal: 3 (12/03/20 0555)  Complications: No notable events documented.

## 2020-12-03 NOTE — Progress Notes (Signed)
Pt ambulated without difficulty or bleeding.   Discharged home with his mom who will drive and stay with pt x 24 hrs.

## 2020-12-03 NOTE — Discharge Instructions (Addendum)
Subcutaneous Cardioverter Defibrillator Implantation, Care After  This sheet gives you information about how to care for yourself after your procedure. Your health care provider may also give you more specific instructions. If you have problems or questions, contact your health care provider.  What can I expect after the procedure? After the procedure, it is common to have: Some pain. It may last a few days. A slight bump under the skin where the subcutaneous implantation cardioverter defibrillator (S-ICD) is. You may be able to feel the device under the skin. This is normal.  Follow these instructions at home: Medicines Take over-the-counter and prescription medicines only as told by your health care provider.   Incision care   Follow instructions from your health care provider about how to take care of your incision. Make sure you: Leave stitches (sutures), skin glue, or adhesive strips in place. These skin closures may need to stay in place for 2 weeks or longer. If adhesive strip edges start to loosen and curl up, you may trim the loose edges. Do not remove adhesive strips completely unless your health care provider tells you to do that. Check your incision area every day for signs of infection. Check for: Redness, swelling, or pain. Fluid or blood. Warmth. Pus or a bad smell.  Activity Do not lift anything that is heavier than 10 lb (4.5 kg), or the limit that you are told, until your health care provider says that it is safe. Avoid sports and any other activity that could cause a hit to the generator or leads. Ask your health care provider what activities are safe for you and when you may return to your normal activities. Follow instructions from your health care provider about exercise and sexual activity restrictions after your procedure.  Electric and magnetic fields Tell all health care providers, including your dentist, that you have a defibrillator. They need to know this so  they do not give you an MRI scan, which uses strong magnets. When using your cell phone, hold it to the ear that is on the opposite side from the defibrillator. Do not leave your cell phone in a pocket over the defibrillator. If you must pass through a metal detector, quickly walk through it. Do not stop under the detector, and do not stand near it. Avoid places or objects that have a strong electric or magnetic field, including: Airport security gates. At the airport, tell officials that you have a defibrillator. Your defibrillator ID card will let you be checked in a way that is safe for you and will not damage your defibrillator. Also, do not let a security person wave a magnetic wand near your defibrillator. That can make it stop working. Power plants. Large electrical generators. Anti-theft systems or electronic article surveillance (EAS). Radiofrequency transmission towers, such as cell phone and radio towers. Do not use amateur (ham) radio equipment or electric (arc) welding torches. Some devices are safe to use if they are held 12 inches (30 cm) or more away from your defibrillator. These include power tools, lawn mowers, and speakers. If you are not sure if something is safe to use, ask your health care provider. Do not use MP3 player headphones. They have magnets. You may safely use electric blankets, heating pads, computers, and microwave ovens. General instructions Do not take baths, swim, shower, or use a hot tub until your health care provider approves. You may need to take sponge baths until your health care provider says that you may bathe or   shower. Do not drive until your health care provider approves. Always keep your defibrillator ID card with you. The card should list the implant date, device model, and manufacturer. Consider wearing a medical alert bracelet or necklace that says that you have an S-ICD. Do not use any products that contain nicotine or tobacco, such as cigarettes  and e-cigarettes. If you need help quitting, ask your health care provider. Have your defibrillator checked as often as told by your health care provider. Most S-ICDs last for 4-8 years before they need to be replaced.  Contact a health care provider if: You feel one shock in your chest. You gain weight suddenly. You have a fever. You have severe pain, and medicines do not help. You have redness, swelling, or pain around your incision area. You have pus or a bad smell coming from your incision area. You have fluid or blood coming from your incision. Your incision area feels warm to the touch. Your heart feels like it is fluttering or skipping beats (heart palpitations). You feel increased anxiety or depression.  Get help right away if: You feel more than one shock. You have chest pain. You have problems breathing or have shortness of breath. You have dizziness or fainting.  Summary After the procedure, you may have some pain, see a bump under your skin, and feel the device under your skin. Check your incision area every day for signs of infection. Be careful around electric and magnetic fields. Always keep your defibrillator ID card with you. You should receive this in 6-8 weeks  

## 2020-12-03 NOTE — Anesthesia Procedure Notes (Signed)
Procedure Name: Intubation Date/Time: 12/03/2020 7:58 AM Performed by: Lowella Dell, CRNA Pre-anesthesia Checklist: Patient identified, Emergency Drugs available, Suction available and Patient being monitored Patient Re-evaluated:Patient Re-evaluated prior to induction Oxygen Delivery Method: Circle System Utilized Preoxygenation: Pre-oxygenation with 100% oxygen Induction Type: IV induction Ventilation: Mask ventilation without difficulty and Oral airway inserted - appropriate to patient size Laryngoscope Size: Mac and 4 Grade View: Grade II Tube type: Oral Tube size: 7.5 mm Number of attempts: 1 Airway Equipment and Method: Stylet and Oral airway Placement Confirmation: ETT inserted through vocal cords under direct vision, positive ETCO2 and breath sounds checked- equal and bilateral Secured at: 22 cm Tube secured with: Tape Dental Injury: Teeth and Oropharynx as per pre-operative assessment

## 2020-12-03 NOTE — Anesthesia Postprocedure Evaluation (Signed)
Anesthesia Post Note  Patient: Beckem Tomberlin  Procedure(s) Performed: SUBQ ICD CHANGEOUT     Patient location during evaluation: PACU Anesthesia Type: General Level of consciousness: awake and alert Pain management: pain level controlled Vital Signs Assessment: post-procedure vital signs reviewed and stable Respiratory status: spontaneous breathing, nonlabored ventilation and respiratory function stable Cardiovascular status: blood pressure returned to baseline and stable Postop Assessment: no apparent nausea or vomiting Anesthetic complications: no    Last Vitals:  Vitals:   12/03/20 1005 12/03/20 1025  BP: (!) 168/97 (!) 169/107  Pulse: 74 74  Resp: 12 16  Temp:    SpO2: 99% 99%    Last Pain:  Vitals:   12/03/20 1025  TempSrc:   PainSc: 0-No pain                 Beryle Lathe

## 2020-12-03 NOTE — Anesthesia Procedure Notes (Signed)
Arterial Line Insertion Start/End9/10/2020 7:10 AM, 12/03/2020 7:20 AM Performed by: Demetrio Lapping, CRNA  Patient location: Pre-op. Preanesthetic checklist: patient identified, IV checked, site marked, risks and benefits discussed, surgical consent, monitors and equipment checked, pre-op evaluation, timeout performed and anesthesia consent Lidocaine 1% used for infiltration Right, radial was placed Catheter size: 20 G Hand hygiene performed  and maximum sterile barriers used   Attempts: 1 Procedure performed without using ultrasound guided technique. Following insertion, Biopatch and dressing applied. Patient tolerated the procedure well with no immediate complications.

## 2020-12-03 NOTE — Interval H&P Note (Signed)
History and Physical Interval Note:  12/03/2020 7:32 AM  Darryl Mitchell  has presented today for surgery, with the diagnosis of ERI.  The various methods of treatment have been discussed with the patient and family. After consideration of risks, benefits and other options for treatment, the patient has consented to  Procedure(s): SUBQ ICD CHANGEOUT (N/A) as a surgical intervention.  The patient's history has been reviewed, patient examined, no change in status, stable for surgery.  I have reviewed the patient's chart and labs.  Questions were answered to the patient's satisfaction.     Lewayne Bunting, MD

## 2020-12-04 ENCOUNTER — Encounter (HOSPITAL_COMMUNITY): Payer: Self-pay | Admitting: Internal Medicine

## 2020-12-04 MED FILL — Fentanyl Citrate Preservative Free (PF) Inj 100 MCG/2ML: INTRAMUSCULAR | Qty: 2 | Status: AC

## 2020-12-09 ENCOUNTER — Other Ambulatory Visit (HOSPITAL_COMMUNITY): Payer: Self-pay | Admitting: Internal Medicine

## 2020-12-16 ENCOUNTER — Other Ambulatory Visit: Payer: Self-pay

## 2020-12-16 ENCOUNTER — Ambulatory Visit (INDEPENDENT_AMBULATORY_CARE_PROVIDER_SITE_OTHER): Payer: Medicare Other

## 2020-12-16 DIAGNOSIS — I428 Other cardiomyopathies: Secondary | ICD-10-CM | POA: Diagnosis not present

## 2020-12-16 LAB — CUP PACEART INCLINIC DEVICE CHECK
Date Time Interrogation Session: 20220921115209
Implantable Lead Implant Date: 20170220
Implantable Lead Location: 753862
Implantable Lead Model: 3401
Implantable Pulse Generator Implant Date: 20220908
Pulse Gen Serial Number: 166835

## 2020-12-16 NOTE — Patient Instructions (Signed)
   After Your ICD (Implantable Cardiac Defibrillator)    Monitor your defibrillator site for redness, swelling, and drainage. Call the device clinic at [phone] if you experience these symptoms or fever/chills.  Your incision was closed with Steri-strips or staples:  You may shower 7 days after your procedure and wash your incision with soap and water. Avoid lotions, ointments, or perfumes over your incision until it is well-healed.    You may use a hot tub or a pool after your wound check appointment if the incision is completely closed.  Do not lift, push or pull greater than 10 pounds with the affected arm until 6 weeks after your procedure. There are no other restrictions in arm movement after your wound check appointment.  Your ICD is MRI compatible.  Your ICD is designed to protect you from life threatening heart rhythms. Because of this, you may receive a shock.   1 shock with no symptoms:  Call the office during business hours. 1 shock with symptoms (chest pain, chest pressure, dizziness, lightheadedness, shortness of breath, overall feeling unwell):  Call 911. If you experience 2 or more shocks in 24 hours:  Call 911. If you receive a shock, you should not drive.  [place] DMV - no driving for 6 months if you receive appropriate therapy from your ICD.   ICD Alerts:  Some alerts are vibratory and others beep. These are NOT emergencies. Please call our office to let us know. If this occurs at night or on weekends, it can wait until the next business day. Send a remote transmission.  If your device is capable of reading fluid status (for heart failure), you will be offered monthly monitoring to review this with you.   Remote monitoring is used to monitor your ICD from home. This monitoring is scheduled every 91 days by our office. It allows us to keep an eye on the functioning of your device to ensure it is working properly. You will routinely see your Electrophysiologist annually  (more often if necessary).   

## 2020-12-16 NOTE — Progress Notes (Signed)
Wound check appointment. Steri-strips removed. Wound without redness or edema. Incision edges approximated, wound well healed. Subcutaneous ICD check in clinic. 0 untreated episodes; 0 treated episodes; 0 shocks delivered. Electrode impedance status okay- 60 ohms. No programming changes. Remaining longevity to ERI 100%.  Patient is enrolled in remote monitoring, next scheduled check 03/15/21.  ROV with Dr. Ladona Ridgel 03/09/21

## 2020-12-23 ENCOUNTER — Other Ambulatory Visit (HOSPITAL_COMMUNITY): Payer: Self-pay | Admitting: Internal Medicine

## 2020-12-29 ENCOUNTER — Encounter (HOSPITAL_COMMUNITY): Payer: Self-pay | Admitting: Cardiology

## 2020-12-29 ENCOUNTER — Ambulatory Visit (HOSPITAL_COMMUNITY)
Admission: RE | Admit: 2020-12-29 | Discharge: 2020-12-29 | Disposition: A | Discharge status: Home / Self Care | Payer: Medicare Other | Source: Ambulatory Visit | Attending: Family Medicine | Admitting: Family Medicine

## 2020-12-29 ENCOUNTER — Other Ambulatory Visit: Payer: Self-pay

## 2020-12-29 ENCOUNTER — Ambulatory Visit (HOSPITAL_BASED_OUTPATIENT_CLINIC_OR_DEPARTMENT_OTHER)
Admission: RE | Admit: 2020-12-29 | Discharge: 2020-12-29 | Disposition: A | Discharge status: Home / Self Care | Payer: Medicare Other | Source: Ambulatory Visit | Attending: Cardiology | Admitting: Cardiology

## 2020-12-29 VITALS — BP 160/80 | HR 68 | Wt 224.2 lb

## 2020-12-29 DIAGNOSIS — I5022 Chronic systolic (congestive) heart failure: Secondary | ICD-10-CM | POA: Diagnosis present

## 2020-12-29 DIAGNOSIS — Z7982 Long term (current) use of aspirin: Secondary | ICD-10-CM | POA: Diagnosis not present

## 2020-12-29 DIAGNOSIS — Z888 Allergy status to other drugs, medicaments and biological substances status: Secondary | ICD-10-CM | POA: Insufficient documentation

## 2020-12-29 DIAGNOSIS — Z5982 Transportation insecurity: Secondary | ICD-10-CM | POA: Diagnosis not present

## 2020-12-29 DIAGNOSIS — F17210 Nicotine dependence, cigarettes, uncomplicated: Secondary | ICD-10-CM | POA: Insufficient documentation

## 2020-12-29 DIAGNOSIS — Z79899 Other long term (current) drug therapy: Secondary | ICD-10-CM | POA: Diagnosis not present

## 2020-12-29 DIAGNOSIS — F419 Anxiety disorder, unspecified: Secondary | ICD-10-CM | POA: Diagnosis not present

## 2020-12-29 DIAGNOSIS — I130 Hypertensive heart and chronic kidney disease with heart failure and stage 1 through stage 4 chronic kidney disease, or unspecified chronic kidney disease: Secondary | ICD-10-CM | POA: Insufficient documentation

## 2020-12-29 DIAGNOSIS — I5082 Biventricular heart failure: Secondary | ICD-10-CM

## 2020-12-29 DIAGNOSIS — N183 Chronic kidney disease, stage 3 unspecified: Secondary | ICD-10-CM | POA: Insufficient documentation

## 2020-12-29 DIAGNOSIS — E785 Hyperlipidemia, unspecified: Secondary | ICD-10-CM

## 2020-12-29 DIAGNOSIS — I5042 Chronic combined systolic (congestive) and diastolic (congestive) heart failure: Secondary | ICD-10-CM | POA: Diagnosis not present

## 2020-12-29 DIAGNOSIS — Z7984 Long term (current) use of oral hypoglycemic drugs: Secondary | ICD-10-CM | POA: Diagnosis not present

## 2020-12-29 DIAGNOSIS — I428 Other cardiomyopathies: Secondary | ICD-10-CM | POA: Diagnosis not present

## 2020-12-29 DIAGNOSIS — E1122 Type 2 diabetes mellitus with diabetic chronic kidney disease: Secondary | ICD-10-CM | POA: Insufficient documentation

## 2020-12-29 DIAGNOSIS — F1010 Alcohol abuse, uncomplicated: Secondary | ICD-10-CM | POA: Insufficient documentation

## 2020-12-29 DIAGNOSIS — Z95810 Presence of automatic (implantable) cardiac defibrillator: Secondary | ICD-10-CM | POA: Diagnosis not present

## 2020-12-29 DIAGNOSIS — Z7901 Long term (current) use of anticoagulants: Secondary | ICD-10-CM | POA: Diagnosis not present

## 2020-12-29 LAB — ECHOCARDIOGRAM COMPLETE
Area-P 1/2: 2.83 cm2
Calc EF: 52.5 %
S' Lateral: 4.3 cm
Single Plane A2C EF: 54.8 %
Single Plane A4C EF: 50.4 %

## 2020-12-29 LAB — BASIC METABOLIC PANEL
Anion gap: 8 (ref 5–15)
BUN: 13 mg/dL (ref 6–20)
CO2: 29 mmol/L (ref 22–32)
Calcium: 9.1 mg/dL (ref 8.9–10.3)
Chloride: 101 mmol/L (ref 98–111)
Creatinine, Ser: 1.49 mg/dL — ABNORMAL HIGH (ref 0.61–1.24)
GFR, Estimated: 59 mL/min — ABNORMAL LOW (ref >60)
Glucose, Bld: 83 mg/dL (ref 70–99)
Potassium: 4.3 mmol/L (ref 3.5–5.1)
Sodium: 138 mmol/L (ref 135–145)

## 2020-12-29 LAB — BRAIN NATRIURETIC PEPTIDE: B Natriuretic Peptide: 36.2 pg/mL (ref 0.0–100.0)

## 2020-12-29 MED ORDER — POTASSIUM CHLORIDE CRYS ER 20 MEQ PO TBCR: 40.0000 meq | EXTENDED_RELEASE_TABLET | Freq: Every day | ORAL | 11 refills | Status: DC

## 2020-12-29 MED ORDER — ISOSORBIDE MONONITRATE ER 30 MG PO TB24: 30.0000 mg | ORAL_TABLET | Freq: Every day | ORAL | 11 refills | Status: DC

## 2020-12-29 MED ORDER — ROSUVASTATIN CALCIUM 20 MG PO TABS: 20.0000 mg | ORAL_TABLET | Freq: Every day | ORAL | 11 refills | Status: DC

## 2020-12-29 MED ORDER — HYDRALAZINE HCL 25 MG PO TABS: 25.0000 mg | ORAL_TABLET | Freq: Three times a day (TID) | ORAL | 11 refills | Status: DC

## 2020-12-29 NOTE — Progress Notes (Signed)
  Echocardiogram 2D Echocardiogram has been performed.  Delcie Roch 12/29/2020, 11:45 AM

## 2020-12-29 NOTE — Patient Instructions (Addendum)
Labs done today. We will contact you only if your labs are abnormal.  START Crestor 20mg  (1 tablet) by mouth daily.   DECREASE Potassium to 30meq(2 tablets) by mouth daily.   STOP taking Digoxin   START Imdur 30mg  (1 tablet) by mouth daily.   INCREASE Hydralazine to 25mg  (1 tablet) by mouth 3 times daily.   No other medication changes were made. Please continue all current medications as prescribed.  Your physician recommends that you schedule a follow-up appointment in:  6 weeks with our APP Clinic and in 2 months for a lab only appointment.   If you have any questions or concerns before your next appointment please send 43[facility] a message through [facility] or call our office at [phone].    TO LEAVE A MESSAGE FOR THE NURSE SELECT OPTION 2, PLEASE LEAVE A MESSAGE INCLUDING: YOUR NAME DATE OF BIRTH CALL BACK NUMBER REASON FOR CALL**this is important as we prioritize the call backs  YOU WILL RECEIVE A CALL BACK THE SAME DAY AS LONG AS YOU CALL BEFORE 4:00 PM   Do the following things EVERYDAY: Weigh yourself in the morning before breakfast. Write it down and keep it in a log. Take your medicines as prescribed Eat low salt foods--Limit salt (sodium) to 2000 mg per day.  Stay as active as you can everyday Limit all fluids for the day to less than 2 liters   At the [facility], you and your health needs are our priority. As part of our continuing mission to provide you with exceptional heart care, we have created designated Provider Care Teams. These Care Teams include your primary Cardiologist (physician) and Advanced Practice Providers (APPs- Physician Assistants and Nurse Practitioners) who all work together to provide you with the care you need, when you need it.   You may see any of the following providers on your designated Care Team at your next follow up: Dr [place] Dr [place], [place] [phone], Arvilla Meres Carron Curie,  PharmD   Please be sure to bring in all your medications bottles to every appointment.

## 2020-12-30 NOTE — Progress Notes (Signed)
[facility] Note   PCP: Pcp, No HF Cardiologist: Dr. Shirlee Latch   Reason for Visit:  F/u for Chronic Systolic HF and VT   HPI: Darryl Mitchell is a 45 y.o. with history of NICM dating back to 2016 s/p SQ [facility] ICD, heavy ETOH abuse, DMII, OSA, HTN, and tobacco abuse.  Had been sober x 2 years but relapsed 4/22. He was drinking 2 gallons of vodka a day and poor compliance w/ home meds. Had been living in [place] but planning to permanently move to [place] to live w/ his mother.   He was admitted to [facility] 6/22 w/ ? seizure activity, VT/VF requiring external/internal shocks in the field.  EP consulted and felt VF in the setting of ETOH abuse + hypokalemia/hypomagnesemia. K/Mag replaced. Initially on amio drip but later stopped due to QT prolongation. Required phenobarbital for ETOH withdrawal. Echo showed severely reduced LVEF at 10% and severely reduced RV systolic function. Ischemic CM felt to be unlikely.   Rhythm stabilized and he had no further VT/VF. He diuresed well w/ IV Lasix, down 20 lb. Transitioned to PO torsemide. GDMT initiated w/ the exception ARNi given h/o angioedema w/ lisinopril. He was started on losartan. D/c wt 218 lb.   Echo today was reviewed and showed EF up to 50% with moderate LVH and normal RV.   Today he returns for HF followup.  He has been doing well symptomatically.  No dyspnea walking on flat ground.  No chest pain.  No lightheadedness. Still with significant anxiety.  BP is elevated.  Weight down 10 lbs. He has cut back considerably on ETOH, now has a couple drinks/week.   Labs (9/22): K 5.1, creatinine 1.45   PMH: 1. ETOH abuse.  2. H/o VT: [employer] subcutaneous ICD.  3. Chronic systolic CHF: Suspect ETOH cardiomyopathy, noted since 2016. Reports prior cath (outside facility) with no significant CAD.  EF waxes and wanes with ETOH use then cutting back on ETOH.  - Echo (6/22): EF 10%, severe LV dilation, severely decreased RV function with  moderate RVE, mild MR. - Echo (10/22): EF 50%, moderate LVH, normal RV.  4. HTN 5. DM2 6. Smoker 7. CKD stage 3  Review of systems complete and found to be negative unless listed in HPI.     Current Outpatient Medications  Medication Sig Dispense Refill   aspirin 81 MG EC tablet Take 1 tablet (81 mg total) by mouth daily. Swallow whole. 30 tablet 11   carvedilol (COREG) 6.25 MG tablet TAKE 3 TABLETS (18.75 MG TOTAL) BY MOUTH 2 (TWO) TIMES DAILY WITH A MEAL. 180 tablet 11   dapagliflozin propanediol (FARXIGA) 10 MG TABS tablet Take 1 tablet (10 mg total) by mouth daily. 180 tablet 3   diphenhydrAMINE (BENADRYL) 25 MG tablet Take 100 mg by mouth at bedtime as needed for allergies or sleep.     folic acid (FOLVITE) 1 MG tablet Take 1 tablet (1 mg total) by mouth daily.     isosorbide mononitrate (IMDUR) 30 MG 24 hr tablet Take 1 tablet (30 mg total) by mouth daily. 30 tablet 11   losartan (COZAAR) 25 MG tablet Take 1 tablet (25 mg total) by mouth in the morning and at bedtime. 60 tablet 3   Multiple Vitamin (MULTIVITAMIN WITH MINERALS) TABS tablet Take 1 tablet by mouth daily.     rosuvastatin (CRESTOR) 20 MG tablet Take 1 tablet (20 mg total) by mouth daily. 30 tablet 11   sertraline (ZOLOFT) 25  MG tablet Take 1 tablet (25 mg total) by mouth daily for 30 days, THEN 2 tablets (50 mg total) daily. 90 tablet 2   thiamine 100 MG tablet Take 1 tablet (100 mg total) by mouth daily.     torsemide (DEMADEX) 20 MG tablet Take 2 tablets (40 mg total) by mouth daily. 60 tablet 11   traZODone (DESYREL) 50 MG tablet Take 50 mg by mouth at bedtime as needed for sleep.     hydrALAZINE (APRESOLINE) 25 MG tablet Take 1 tablet (25 mg total) by mouth 3 (three) times daily. 90 tablet 11   potassium chloride SA (KLOR-CON) 20 MEQ tablet Take 2 tablets (40 mEq total) by mouth daily. 60 tablet 11   No current facility-administered medications for this encounter.   Allergies  Allergen Reactions   Lisinopril  Swelling    Social History   Socioeconomic History   Marital status: Divorced    Spouse name: Not on file   Number of children: Not on file   Years of education: Not on file   Highest education level: Not on file  Occupational History   Not on file  Tobacco Use   Smoking status: Every Day    Packs/day: 3.00    Types: Cigarettes   Smokeless tobacco: Current  Substance and Sexual Activity   Alcohol use: Yes    Comment: 2 gallons of Vodka   Drug use: Not on file   Sexual activity: Not on file  Other Topics Concern   Not on file  Social History Narrative   Not on file   Social Determinants of Health   Financial Resource Strain: Medium Risk   Difficulty of Paying Living Expenses: Somewhat hard  Food Insecurity: No Food Insecurity   Worried About Running Out of Food in the Last Year: Never true   Ran Out of Food in the Last Year: Never true  Transportation Needs: Unmet Transportation Needs   Lack of Transportation (Medical): Yes   Lack of Transportation (Non-Medical): Yes  Physical Activity: Not on file  Stress: Not on file  Social Connections: Not on file  Intimate Partner Violence: Not on file   History reviewed. No pertinent family history.  BP (!) 160/80   Pulse 68   Wt 101.7 kg (224 lb 3.2 oz)   SpO2 99%   BMI 30.83 kg/m   Wt Readings from Last 3 Encounters:  12/29/20 101.7 kg (224 lb 3.2 oz)  12/03/20 106.6 kg (235 lb)  11/16/20 106.1 kg (234 lb)   PHYSICAL EXAM: General: NAD Neck: No JVD, no thyromegaly or thyroid nodule.  Lungs: Clear to auscultation bilaterally with normal respiratory effort. CV: Nondisplaced PMI.  Heart regular S1/S2, no S3/S4, no murmur.  No peripheral edema.  No carotid bruit.  Normal pedal pulses.  Abdomen: Soft, nontender, no hepatosplenomegaly, no distention.  Skin: Intact without lesions or rashes.  Neurologic: Alert and oriented x 3.  Psych: Normal affect. Extremities: No clubbing or cyanosis.  HEENT: Normal.   ASSESSMENT  & PLAN: 1.Chronic systolic CHF: Biventricular failure, followed in [place] in the past with diagnosis in 2016.  Echoes have shown EF up and down, appears to fluctuate with medication compliance and ETOH intake. Possible ETOH cardiomyopathy though HTN may play a role. Echo at [facility] 6/22 w/ EF 10%, severe LV dilation, severely reduced RV systolic function, mild MR.  This was in setting of heavy ETOH abuse.  Self reports prior cath without CAD, though records are not available to [place] for  review.  Has [employer] ICD.  Echo today showed EF up to 50%, he has cut back a lot on ETOH use (2 beers/week).  He is not volume overloaded on exam, NYHA class II. - Continue torsemide 40 mg daily, decrease KCl to 40 daily. BMET today. - Continue  losartan 25 mg bid. No Entresto given h/o angioedema w/ lisinopril . - Continue Coreg 18.75 mg bid. - Continue Farxiga 10 mg daily. - Stop digoxin with improved EF.  - Restart hydralazine 25 mg tid and Imdur 30 mg daily.   - Imperative to limit ETOH.  EF tracks inversely with ETOH use.  2.   H/o VT/VF: admit 6/22 w/ VT/VF w/ multiple ICD shocks, in setting of ETOH intoxication, hypokalemia and hypomagnesia. Initially placed on amio but discontinued due to QT prolongation.  He has a Environmental manager SQ ICD and denies any further shocks.  - Continue Coreg 18.75 mg bid. - Needs to completely abstain from ETOH.  - No driving for 6 months (~78/24). 3.   H/o ETOH Abuse:  He has cut back significantly.   - Reiterated the importance of abstinence given cardiac issues and discussed seeking professional help for this. 4. Type 2 DM - Continue Farxiga. No GU symptoms. 5. HTN: Elevated BP recently.  - Restart hydralazine/Imdur.  6. HLD: LDL 172 most recently.  - Start Crestor 20 mg daily with lipids/LFTs in 2 months.   Follow up in 6 weeks APP 6 wks.   Marca Ancona, MD 12/30/20

## 2021-01-05 ENCOUNTER — Other Ambulatory Visit (HOSPITAL_COMMUNITY): Payer: Self-pay | Admitting: Cardiology

## 2021-01-10 ENCOUNTER — Other Ambulatory Visit (HOSPITAL_COMMUNITY): Payer: Self-pay | Admitting: Family Medicine

## 2021-01-12 ENCOUNTER — Telehealth (HOSPITAL_COMMUNITY): Payer: Self-pay | Admitting: Surgery

## 2021-01-12 NOTE — Telephone Encounter (Signed)
I attempted to reach patient to remind him to perform his ordered home sleep study.  I did not reach him and was unable to leave a message.

## 2021-01-22 ENCOUNTER — Ambulatory Visit: Payer: 59 | Admitting: Internal Medicine

## 2021-02-01 ENCOUNTER — Telehealth (HOSPITAL_COMMUNITY): Payer: Self-pay | Admitting: Surgery

## 2021-02-01 NOTE — Telephone Encounter (Signed)
Patient contacted to remind perform ordered home sleep study.  I left a message to request that it be completed and asked that he call back with any questions or concerns. 

## 2021-02-08 NOTE — Progress Notes (Signed)
Advanced Heart Failure Clinic Note   PCP: Pcp, No HF Cardiologist: Dr. Shirlee Latch   Reason for Visit:  F/u for Chronic Systolic HF and VT   HPI: Stockton Nunley is a 45 y.o. with history of NICM dating back to 2016 s/p S[facility] ICD, heavy ETOH abuse, DMII, OSA, HTN, and tobacco abuse.  Had been sober x 2 years but relapsed 4/22. He was drinking 2 gallons of vodka a day and poor compliance w/ home meds. Had been living in [place] but planning to permanently move to [place] to live w/ his mother.   He was admitted to [facility] 6/22 w/ ? seizure activity, VT/VF requiring external/internal shocks in the field.  EP consulted and felt VF in the setting of ETOH abuse + hypokalemia/hypomagnesemia. K/Mag replaced. Initially on amio drip but later stopped due to QT prolongation. Required phenobarbital for ETOH withdrawal. Echo showed severely reduced LVEF at 10% and severely reduced RV systolic function. Ischemic CM felt to be unlikely.   Rhythm stabilized and he had no further VT/VF. He diuresed well w/ IV Lasix, down 20 lb. Transitioned to PO torsemide. GDMT initiated w/ the exception ARNi given h/o angioedema w/ lisinopril. He was started on losartan. D/c wt 218 lb.   Echo 10/22 showed EF up to 50% with moderate LVH and normal RV.   Today he returns for HF follow up. He is more SOB x 3-4 days. He has been off his [place] for 3-4 weeks after running out. Feels some palpitations with anxiety/panic. Denies CP, dizziness, edema, or PND/Orthopnea. Appetite ok. No fever or chills. Weight at home 227 pounds. Drinking 24 oz beer couple times a week. He is staying with his mother but "she is no longer involved in my medications." Having trouble affording some of his medications, planning on starting to do part time work soon. Wants to start boxing for exercise, currently doing cardio on TM.  Labs (9/22): K 5.1, creatinine 1.45  Labs (10/22): K 4.3, creatinine 1.49  PMH: 1. ETOH abuse.  2. H/o VT: [employer]  subcutaneous ICD.  3. Chronic systolic CHF: Suspect ETOH cardiomyopathy, noted since 2016. Reports prior cath (outside facility) with no significant CAD.  EF waxes and wanes with ETOH use then cutting back on ETOH.  - Echo (6/22): EF 10%, severe LV dilation, severely decreased RV function with moderate RVE, mild MR. - Echo (10/22): EF 50%, moderate LVH, normal RV.  4. HTN 5. DM2 6. Smoker 7. CKD stage 3  Review of systems complete and found to be negative unless listed in HPI.    Current Outpatient Medications  Medication Sig Dispense Refill   aspirin 81 MG EC tablet Take 1 tablet (81 mg total) by mouth daily. Swallow whole. 30 tablet 11   carvedilol (COREG) 6.25 MG tablet TAKE 3 TABLETS (18.75 MG TOTAL) BY MOUTH 2 (TWO) TIMES DAILY WITH A MEAL. 180 tablet 11   diphenhydrAMINE (BENADRYL) 25 MG tablet Take 150 mg by mouth at bedtime as needed for allergies or sleep.     folic acid (FOLVITE) 1 MG tablet Take 1 tablet (1 mg total) by mouth daily.     hydrALAZINE (APRESOLINE) 25 MG tablet Take 1 tablet (25 mg total) by mouth 3 (three) times daily. (Patient taking differently: Take 25 mg by mouth 3 (three) times daily. As needed) 90 tablet 11   losartan (COZAAR) 25 MG tablet TAKE 1 TABLET (25 MG TOTAL) BY MOUTH IN THE MORNING AND AT BEDTIME. 180 tablet 3  Multiple Vitamin (MULTIVITAMIN WITH MINERALS) TABS tablet Take 1 tablet by mouth daily.     potassium chloride SA (KLOR-CON) 20 MEQ tablet Take 2 tablets (40 mEq total) by mouth daily. (Patient taking differently: Take 40 mEq by mouth daily. Patient takes 3 tablets daily.) 60 tablet 11   thiamine 100 MG tablet Take 1 tablet (100 mg total) by mouth daily.     torsemide (DEMADEX) 20 MG tablet Take 2 tablets (40 mg total) by mouth daily. 60 tablet 11   traZODone (DESYREL) 50 MG tablet Take 50 mg by mouth at bedtime as needed for sleep.     dapagliflozin propanediol (FARXIGA) 10 MG TABS tablet Take 1 tablet (10 mg total) by mouth daily. (Patient not  taking: Reported on 02/09/2021) 180 tablet 3   isosorbide mononitrate (IMDUR) 30 MG 24 hr tablet Take 1 tablet (30 mg total) by mouth daily. (Patient not taking: Reported on 02/09/2021) 30 tablet 11   No current facility-administered medications for this encounter.   Allergies  Allergen Reactions   Lisinopril Swelling    Social History   Socioeconomic History   Marital status: Divorced    Spouse name: Not on file   Number of children: Not on file   Years of education: Not on file   Highest education level: Not on file  Occupational History   Not on file  Tobacco Use   Smoking status: Every Day    Packs/day: 3.00    Types: Cigarettes   Smokeless tobacco: Current  Substance and Sexual Activity   Alcohol use: Yes    Comment: 2 gallons of Vodka   Drug use: Not on file   Sexual activity: Not on file  Other Topics Concern   Not on file  Social History Narrative   Not on file   Social Determinants of Health   Financial Resource Strain: Medium Risk   Difficulty of Paying Living Expenses: Somewhat hard  Food Insecurity: No Food Insecurity   Worried About Running Out of Food in the Last Year: Never true   Ran Out of Food in the Last Year: Never true  Transportation Needs: Unmet Transportation Needs   Lack of Transportation (Medical): Yes   Lack of Transportation (Non-Medical): Yes  Physical Activity: Not on file  Stress: Not on file  Social Connections: Not on file  Intimate Partner Violence: Not on file   No family history on file.  BP (!) 157/98   Pulse 79   Wt 105.4 kg (232 lb 6.4 oz)   SpO2 98%   BMI 31.96 kg/m   Wt Readings from Last 3 Encounters:  02/09/21 105.4 kg (232 lb 6.4 oz)  12/29/20 101.7 kg (224 lb 3.2 oz)  12/03/20 106.6 kg (235 lb)   PHYSICAL EXAM: General:  NAD. No resp difficulty HEENT: Normal Neck: Supple. JVP to jaw. Carotids 2+ bilat; no bruits. No lymphadenopathy or thryomegaly appreciated. Cor: PMI nondisplaced. Regular rate & rhythm.  No rubs, gallops or murmurs. Lungs: Clear Abdomen: Soft, nontender, nondistended. No hepatosplenomegaly. No bruits or masses. Good bowel sounds. Extremities: No cyanosis, clubbing, rash, 1+ BLE edema Neuro: Alert & oriented x 3, cranial nerves grossly intact. Moves all 4 extremities w/o difficulty. Affect pleasant.  ASSESSMENT & PLAN: 1.Chronic systolic CHF: Biventricular failure, followed in [place] in the past with diagnosis in 2016.  Echoes have shown EF up and down, appears to fluctuate with medication compliance and ETOH intake. Possible ETOH cardiomyopathy though HTN may play a role. Echo at [facility] 6/22  w/ EF 10%, severe LV dilation, severely reduced RV systolic function, mild MR.  This was in setting of heavy ETOH abuse.  Self reports prior cath without CAD, though records are not available to [place] for review.  Has [employer] ICD.  Echo 10/22 EF up to 50%, he has cut back a lot on ETOH use.  NYHA III, he is volume overloaded on exam today, weight up 8 lbs. - Restart Farxiga 10 mg daily. BMET/BNP today, repeat BMET in 10 days. - Continue torsemide 40 mg daily, decrease KCl to 40 daily. BMET today. - Continue losartan 25 mg bid. No Entresto given h/o angioedema w/ lisinopril . - Continue Coreg 18.75 mg bid.  - Restart hydralazine 25 mg tid and Imdur 30 mg daily.   - Imperative to limit ETOH.  EF tracks inversely with ETOH use.  2.   H/o VT/VF: admit 6/22 w/ VT/VF w/ multiple ICD shocks, in setting of ETOH intoxication, hypokalemia and hypomagnesia. Initially placed on amio but discontinued due to QT prolongation.  He has a [employer] ICD and denies any further shocks.  - Continue Coreg 18.75 mg bid. - Needs to completely abstain from ETOH.  - No driving for 6 months (~12/22). 3.   H/o ETOH Abuse:  He has cut back significantly.   - Reiterated the importance of abstinence given cardiac issues and discussed seeking professional help for this. 4. Type 2 DM - Restart Farxiga.   5. HTN: Elevated BP recently.  - Restart hydralazine/Imdur.  6. HLD: LDL 172 most recently.  - Restart Crestor 20 mg daily with lipids/LFTs in 2 months. Rx sent to [facility], $3 for first 30-day supply, then $16 thereafter. 7. Medication non-compliance: Refer to paramedicine. He was given a list of PCPs today to establish care.  Follow up with APP in 4-6 weeks.   [place], FNP 02/09/21

## 2021-02-09 ENCOUNTER — Ambulatory Visit (HOSPITAL_COMMUNITY)
Admission: RE | Admit: 2021-02-09 | Discharge: 2021-02-09 | Disposition: A | Discharge status: Home / Self Care | Payer: Medicare Other | Source: Ambulatory Visit | Attending: Family Medicine | Admitting: Family Medicine

## 2021-02-09 ENCOUNTER — Encounter (HOSPITAL_COMMUNITY): Payer: Self-pay

## 2021-02-09 ENCOUNTER — Other Ambulatory Visit (HOSPITAL_COMMUNITY): Payer: Self-pay

## 2021-02-09 VITALS — BP 157/98 | HR 79 | Wt 232.4 lb

## 2021-02-09 DIAGNOSIS — Z9114 Patient's other noncompliance with medication regimen: Secondary | ICD-10-CM | POA: Diagnosis not present

## 2021-02-09 DIAGNOSIS — I428 Other cardiomyopathies: Secondary | ICD-10-CM | POA: Insufficient documentation

## 2021-02-09 DIAGNOSIS — E119 Type 2 diabetes mellitus without complications: Secondary | ICD-10-CM

## 2021-02-09 DIAGNOSIS — E1122 Type 2 diabetes mellitus with diabetic chronic kidney disease: Secondary | ICD-10-CM | POA: Diagnosis not present

## 2021-02-09 DIAGNOSIS — R002 Palpitations: Secondary | ICD-10-CM | POA: Diagnosis not present

## 2021-02-09 DIAGNOSIS — F1010 Alcohol abuse, uncomplicated: Secondary | ICD-10-CM | POA: Diagnosis not present

## 2021-02-09 DIAGNOSIS — E785 Hyperlipidemia, unspecified: Secondary | ICD-10-CM | POA: Insufficient documentation

## 2021-02-09 DIAGNOSIS — Z7141 Alcohol abuse counseling and surveillance of alcoholic: Secondary | ICD-10-CM | POA: Insufficient documentation

## 2021-02-09 DIAGNOSIS — Z91120 Patient's intentional underdosing of medication regimen due to financial hardship: Secondary | ICD-10-CM | POA: Diagnosis not present

## 2021-02-09 DIAGNOSIS — Z8679 Personal history of other diseases of the circulatory system: Secondary | ICD-10-CM | POA: Diagnosis not present

## 2021-02-09 DIAGNOSIS — G4733 Obstructive sleep apnea (adult) (pediatric): Secondary | ICD-10-CM | POA: Insufficient documentation

## 2021-02-09 DIAGNOSIS — I5082 Biventricular heart failure: Secondary | ICD-10-CM | POA: Insufficient documentation

## 2021-02-09 DIAGNOSIS — I5042 Chronic combined systolic (congestive) and diastolic (congestive) heart failure: Secondary | ICD-10-CM | POA: Diagnosis not present

## 2021-02-09 DIAGNOSIS — F17210 Nicotine dependence, cigarettes, uncomplicated: Secondary | ICD-10-CM | POA: Insufficient documentation

## 2021-02-09 DIAGNOSIS — I10 Essential (primary) hypertension: Secondary | ICD-10-CM

## 2021-02-09 DIAGNOSIS — I5022 Chronic systolic (congestive) heart failure: Secondary | ICD-10-CM | POA: Diagnosis not present

## 2021-02-09 DIAGNOSIS — Z7984 Long term (current) use of oral hypoglycemic drugs: Secondary | ICD-10-CM | POA: Diagnosis not present

## 2021-02-09 DIAGNOSIS — I130 Hypertensive heart and chronic kidney disease with heart failure and stage 1 through stage 4 chronic kidney disease, or unspecified chronic kidney disease: Secondary | ICD-10-CM | POA: Diagnosis present

## 2021-02-09 DIAGNOSIS — N183 Chronic kidney disease, stage 3 unspecified: Secondary | ICD-10-CM | POA: Insufficient documentation

## 2021-02-09 DIAGNOSIS — Z95810 Presence of automatic (implantable) cardiac defibrillator: Secondary | ICD-10-CM | POA: Diagnosis not present

## 2021-02-09 DIAGNOSIS — F410 Panic disorder [episodic paroxysmal anxiety] without agoraphobia: Secondary | ICD-10-CM

## 2021-02-09 DIAGNOSIS — Z79899 Other long term (current) drug therapy: Secondary | ICD-10-CM | POA: Diagnosis not present

## 2021-02-09 DIAGNOSIS — F419 Anxiety disorder, unspecified: Secondary | ICD-10-CM

## 2021-02-09 LAB — BASIC METABOLIC PANEL
Anion gap: 7 (ref 5–15)
BUN: 14 mg/dL (ref 6–20)
CO2: 23 mmol/L (ref 22–32)
Calcium: 8.7 mg/dL — ABNORMAL LOW (ref 8.9–10.3)
Chloride: 105 mmol/L (ref 98–111)
Creatinine, Ser: 1.66 mg/dL — ABNORMAL HIGH (ref 0.61–1.24)
GFR, Estimated: 51 mL/min — ABNORMAL LOW (ref >60)
Glucose, Bld: 98 mg/dL (ref 70–99)
Potassium: 4.4 mmol/L (ref 3.5–5.1)
Sodium: 135 mmol/L (ref 135–145)

## 2021-02-09 LAB — BRAIN NATRIURETIC PEPTIDE: B Natriuretic Peptide: 34.4 pg/mL (ref 0.0–100.0)

## 2021-02-09 MED ORDER — ROSUVASTATIN CALCIUM 20 MG PO TABS: 20.0000 mg | ORAL_TABLET | Freq: Every day | ORAL | 11 refills | Status: DC

## 2021-02-09 MED ORDER — LOSARTAN POTASSIUM 25 MG PO TABS: 25.0000 mg | ORAL_TABLET | Freq: Two times a day (BID) | ORAL | 3 refills | Status: DC

## 2021-02-09 MED ORDER — POTASSIUM CHLORIDE CRYS ER 20 MEQ PO TBCR: 40.0000 meq | EXTENDED_RELEASE_TABLET | Freq: Every day | ORAL | 11 refills | Status: DC

## 2021-02-09 MED ORDER — DAPAGLIFLOZIN PROPANEDIOL 10 MG PO TABS: 10.0000 mg | ORAL_TABLET | Freq: Every day | ORAL | 3 refills | Status: DC

## 2021-02-09 MED ORDER — HYDRALAZINE HCL 25 MG PO TABS: 25.0000 mg | ORAL_TABLET | Freq: Three times a day (TID) | ORAL | 11 refills | Status: DC

## 2021-02-09 MED ORDER — CARVEDILOL 6.25 MG PO TABS: 18.7500 mg | ORAL_TABLET | Freq: Two times a day (BID) | ORAL | 11 refills | Status: DC

## 2021-02-09 MED ORDER — ISOSORBIDE MONONITRATE ER 30 MG PO TB24: 30.0000 mg | ORAL_TABLET | Freq: Every day | ORAL | 11 refills | Status: DC

## 2021-02-09 MED ORDER — TORSEMIDE 20 MG PO TABS
40.0000 mg | ORAL_TABLET | Freq: Every day | ORAL | 11 refills | Status: DC
Start: 2021-02-09 — End: 2021-02-25

## 2021-02-09 MED ORDER — ASPIRIN 81 MG PO TBEC: 81.0000 mg | DELAYED_RELEASE_TABLET | Freq: Every day | ORAL | 11 refills | Status: DC

## 2021-02-09 NOTE — Patient Instructions (Signed)
RESTART Farxiga 10 mg, one tab daily RESTART Hydralazine 35 mg, one tab three times daily RESTART Imdur 30 mg, one tab daily START Rosuvastatin  20 mg one tab nightly at bedtime   Labs today We will only contact you if something comes back abnormal or we need to make some changes. Otherwise no news is good news!  -Labs needed in  7-10 days  You have been referred to [facility] for further CHF management in the comfort of your home. A team member will be in contact with you to arrange a home visit  Your physician recommends that you schedule a follow-up appointment in: 6-8 weeks  in the Advanced Practitioners (PA/NP) Clinic    Do the following things EVERYDAY: Weigh yourself in the morning before breakfast. Write it down and keep it in a log. Take your medicines as prescribed Eat low salt foods--Limit salt (sodium) to 2000 mg per day.  Stay as active as you can everyday Limit all fluids for the day to less than 2 liters  At the Advanced Heart Failure Clinic, you and your health needs are our priority. As part of our continuing mission to provide you with exceptional heart care, we have created designated Provider Care Teams. These Care Teams include your primary Cardiologist (physician) and Advanced Practice Providers (APPs- Physician Assistants and Nurse Practitioners) who all work together to provide you with the care you need, when you need it.   You may see any of the following providers on your designated Care Team at your next follow up: Dr Arvilla Meres Dr Carron Curie, NP Robbie Lis, [place] [facility] [place], [place] Karle Plumber, PharmD   Please be sure to bring in all your medications bottles to every appointment.    If you have any questions or concerns before your next appointment please send [place] a message through [place] or call our office at [phone].    TO LEAVE A MESSAGE FOR THE NURSE SELECT OPTION 2, PLEASE  LEAVE A MESSAGE INCLUDING: YOUR NAME DATE OF BIRTH CALL BACK NUMBER REASON FOR CALL**this is important as we prioritize the call backs  YOU WILL RECEIVE A CALL BACK THE SAME DAY AS LONG AS YOU CALL BEFORE 4:00 PM

## 2021-02-10 ENCOUNTER — Telehealth: Payer: Self-pay

## 2021-02-10 ENCOUNTER — Telehealth (HOSPITAL_COMMUNITY): Payer: Self-pay | Admitting: Licensed Clinical Social Worker

## 2021-02-10 NOTE — Telephone Encounter (Signed)
CSW consulted to enroll pt in [employer]- attempted to call to complete initial assessment- unable to reach- left VM requesting return call  Burna Sis, LCSW Clinical Social Worker Advanced Heart Failure Clinic Desk#: [phone] Cell#: [phone]

## 2021-02-10 NOTE — Telephone Encounter (Signed)
Attempted to contact patient to advise not to send manual transmissions. No answer, LMTCB.

## 2021-02-12 ENCOUNTER — Telehealth (HOSPITAL_COMMUNITY): Payer: Self-pay | Admitting: Licensed Clinical Social Worker

## 2021-02-12 NOTE — Progress Notes (Signed)
Heart and Vascular Care Navigation  02/12/2021  Darryl Mitchell 1975-10-21 [id number]  Reason for Referral: Community Paramedicine Enrollment    Engaged with patient by telephone for follow up visit for Heart and Vascular Care Coordination.                                                                                                   Paramedicine Initial Assessment:  Housing:  In what kind of housing do you live? House/apt/trailer/shelter?  Do you rent/pay a mortgage/own? $150  Do you live with anyone? Mom and niece  Are you currently worried about losing your housing? no  Social:  What is your current marital status? divorced  Do you have any children? 45 and 15 yo who live with their mom  Do you have family or friends who live locally? Mom and niece- don't really feel as if they are much support but agrees to keep his mom as emergency contact.  Food:  States that he, his mother, and his niece don't share finances so some problems with affording his own food. Gets food stamps but only $20 a month.  Provided with list of food pantries to access if he needs.  Income:  What is your current source of income? SSDI- gets $1,000/month   How hard is it for you to pay for the basics like food housing, medical care, and utilities? hard  Do you have outstanding medical bills? yes  Insurance:  Are you currently insured? Has Medicare A and B.  Do you have prescription coverage? No- encouraged pt to call [facility] to discuss getting Medicare part D as well as Extra Help program  If no insurance, have you applied for coverage (Medicaid, disability, marketplace etc)?  Encouraged pt to call DHHS to discuss applying for Medicaid.  When he was first deemed disabled he had insurance through his wife so was also ineligible for Medicaid cause of their duel income.  Now pt is divorced and reports income is $1000 before insurance deductions and that he has no reserves- informed pt he would likely now  qualify for Medicaid so should contact DHHS to apply for Medicaid benefits to help him manage medical bills.  Transportation:  Do you have transportation to your medical appointments? Has private vehicle.    Daily Health Needs: Do you have a working scale at home? yes  How do you manage your medications at home? Tried a pill box but didn't work for him.  Do you ever take your medications differently than prescribed? No intentionally  Do you have issues affording your medications? Yes- struggles with affording meds at this time- clinic sent meds to [facility] to help with costs.  Do you have any concerns with mobility at home? no  Do you use any assistive devices at home or have PCS at home?  no  Do you have a PCP? Working on getting PCP- does not want help at this time.  Do you have any trouble reading or writing? no  Are there any additional barriers you see to getting the care you need? CSW brought up concerns  expressed during appt of current anxiety/depression as well as alcohol abuse.  Pt admits to anxiety/depression but states that he is not interested in talking with someone about it at this time.  Was a heavy drinker but after hospital stay states he is only drinking once a week and usually about 1-2 drinks- does not feel as if he needs support for alcohol abuse at this time.  CSW will continue to follow through paramedicine program and assist as needed.                                      HRT/VAS Care Coordination     Living arrangements for the past 2 months No permanent address  Patient reports he is staying with his mother in [place], [place]   Lives with: Parents; Relatives   Patient Current Insurance Coverage Traditional Medicare   Does Patient Have Prescription Coverage? No   DME Agency NA       Social History:                                                                             SDOH Screenings   Alcohol Screen: Not on file  Depression (KPT4-6): Not on file   Financial Resource Strain: Medium Risk   Difficulty of Paying Living Expenses: Somewhat hard  Food Insecurity: No Food Insecurity   Worried About Running Out of Food in the Last Year: Never true   Ran Out of Food in the Last Year: Never true  Housing: High Risk   Last Housing Risk Score: 2  Physical Activity: Not on file  Social Connections: Not on file  Stress: Not on file  Tobacco Use: High Risk   Smoking Tobacco Use: Every Day   Smokeless Tobacco Use: Current   Passive Exposure: Not on file  Transportation Needs: Unmet Transportation Needs   Lack of Transportation (Medical): Yes   Lack of Transportation (Non-Medical): Yes    SDOH Interventions: Financial Resources:   N/a  Food Insecurity:   Receives food stamps but only $20- asked about getting more and explained he would need to call DSS caseworker to inquire about if he would qualify for more  Housing Insecurity:   None reported  Transportation:    Has vehicle     Follow-up plan:     Referral sent out to paramedics for assignment- they will follow up with pt and schedule initial visit  Burna Sis, LCSW Clinical Social Worker Advanced [facility] Failure Clinic Desk#: [phone] Cell#: [phone]

## 2021-02-22 ENCOUNTER — Other Ambulatory Visit: Payer: Self-pay

## 2021-02-22 ENCOUNTER — Ambulatory Visit (HOSPITAL_COMMUNITY)
Admission: RE | Admit: 2021-02-22 | Discharge: 2021-02-22 | Disposition: A | Discharge status: Home / Self Care | Payer: Medicare Other | Source: Ambulatory Visit | Attending: Cardiology | Admitting: Cardiology

## 2021-02-22 DIAGNOSIS — E785 Hyperlipidemia, unspecified: Secondary | ICD-10-CM | POA: Diagnosis not present

## 2021-02-22 DIAGNOSIS — I5042 Chronic combined systolic (congestive) and diastolic (congestive) heart failure: Secondary | ICD-10-CM | POA: Diagnosis not present

## 2021-02-22 LAB — LIPID PANEL
Cholesterol: 206 mg/dL — ABNORMAL HIGH (ref 0–200)
HDL: 58 mg/dL (ref >40)
LDL Cholesterol: 128 mg/dL — ABNORMAL HIGH (ref 0–99)
Total CHOL/HDL Ratio: 3.6 RATIO
Triglycerides: 99 mg/dL (ref <150)
VLDL: 20 mg/dL (ref 0–40)

## 2021-02-22 LAB — BASIC METABOLIC PANEL
Anion gap: 10 (ref 5–15)
BUN: 24 mg/dL — ABNORMAL HIGH (ref 6–20)
CO2: 24 mmol/L (ref 22–32)
Calcium: 8.9 mg/dL (ref 8.9–10.3)
Chloride: 104 mmol/L (ref 98–111)
Creatinine, Ser: 1.54 mg/dL — ABNORMAL HIGH (ref 0.61–1.24)
GFR, Estimated: 56 mL/min — ABNORMAL LOW (ref >60)
Glucose, Bld: 82 mg/dL (ref 70–99)
Potassium: 4.3 mmol/L (ref 3.5–5.1)
Sodium: 138 mmol/L (ref 135–145)

## 2021-02-22 LAB — HEPATIC FUNCTION PANEL
ALT: 22 U/L (ref 0–44)
AST: 26 U/L (ref 15–41)
Albumin: 3.9 g/dL (ref 3.5–5.0)
Alkaline Phosphatase: 74 U/L (ref 38–126)
Bilirubin, Direct: <0.1 mg/dL (ref 0.0–0.2)
Total Bilirubin: 0.7 mg/dL (ref 0.3–1.2)
Total Protein: 6.7 g/dL (ref 6.5–8.1)

## 2021-02-25 ENCOUNTER — Telehealth (HOSPITAL_COMMUNITY): Payer: Self-pay | Admitting: Cardiology

## 2021-02-25 ENCOUNTER — Other Ambulatory Visit (HOSPITAL_COMMUNITY): Payer: Self-pay

## 2021-02-25 MED ORDER — DAPAGLIFLOZIN PROPANEDIOL 10 MG PO TABS
10.0000 mg | ORAL_TABLET | Freq: Every day | ORAL | 3 refills | Status: DC
Start: 2021-02-25 — End: 2023-03-15

## 2021-02-25 MED ORDER — HYDRALAZINE HCL 25 MG PO TABS: 25.0000 mg | ORAL_TABLET | Freq: Three times a day (TID) | ORAL | 11 refills | Status: DC

## 2021-02-25 MED ORDER — ISOSORBIDE MONONITRATE ER 30 MG PO TB24: 30.0000 mg | ORAL_TABLET | Freq: Every day | ORAL | 11 refills | Status: DC

## 2021-02-25 MED ORDER — ROSUVASTATIN CALCIUM 20 MG PO TABS
40.0000 mg | ORAL_TABLET | Freq: Every day | ORAL | 2 refills | Status: DC
Start: 2021-02-25 — End: 2023-03-15

## 2021-02-25 MED ORDER — CARVEDILOL 6.25 MG PO TABS: 18.7500 mg | ORAL_TABLET | Freq: Two times a day (BID) | ORAL | 11 refills | Status: DC

## 2021-02-25 MED ORDER — LOSARTAN POTASSIUM 25 MG PO TABS: 25.0000 mg | ORAL_TABLET | Freq: Two times a day (BID) | ORAL | 3 refills | Status: DC

## 2021-02-25 MED ORDER — POTASSIUM CHLORIDE CRYS ER 20 MEQ PO TBCR: 40.0000 meq | EXTENDED_RELEASE_TABLET | Freq: Every day | ORAL | 11 refills | Status: AC

## 2021-02-25 MED ORDER — TORSEMIDE 20 MG PO TABS
40.0000 mg | ORAL_TABLET | Freq: Every day | ORAL | 11 refills | Status: DC
Start: 2021-02-25 — End: 2023-03-15

## 2021-02-25 NOTE — Telephone Encounter (Signed)
-----   Message from Laurey Morale, MD sent at 02/23/2021  8:59 AM EST ----- Increase Crestor to 40 mg daily with lipids/LFTs in 2 months.

## 2021-03-01 ENCOUNTER — Other Ambulatory Visit (HOSPITAL_COMMUNITY): Payer: Medicare Other

## 2021-03-01 ENCOUNTER — Telehealth (HOSPITAL_COMMUNITY): Payer: Self-pay

## 2021-03-01 NOTE — Telephone Encounter (Signed)
Attempted to reach Mr. Darryl Mitchell, unsuccessful attempt. I will continue to reach out to establish paramedicine.

## 2021-03-03 ENCOUNTER — Encounter (HOSPITAL_COMMUNITY): Payer: Self-pay

## 2021-03-09 ENCOUNTER — Encounter: Payer: 59 | Admitting: Internal Medicine

## 2021-03-11 ENCOUNTER — Telehealth (HOSPITAL_COMMUNITY): Payer: Self-pay

## 2021-03-11 ENCOUNTER — Telehealth (HOSPITAL_COMMUNITY): Payer: Self-pay | Admitting: Licensed Clinical Social Worker

## 2021-03-11 NOTE — Telephone Encounter (Signed)
Attempted to reach Mr. Marti for setting up paramedicine with no success. Phone is not in service. I will make HF clinic staff aware as this has been my second attempt.

## 2021-03-11 NOTE — Telephone Encounter (Signed)
CSW informed paramedic has been unable to contact pt so CSW attempting to reach.  Called pt phone but phone not currently working   Called pt mother who is listed as emergency contact- left VM requesting return call  Burna Sis, LCSW Clinical Social Worker Advanced [facility] Clinic Desk#: [phone] Cell#: [phone]

## 2021-03-17 ENCOUNTER — Telehealth (HOSPITAL_COMMUNITY): Payer: Self-pay | Admitting: Licensed Clinical Social Worker

## 2021-03-17 NOTE — Telephone Encounter (Signed)
Pt being DC'd from paramedicine program as unable to get a hold of to set up initial visit.  Can reassess for enrollment if pt re-engages with clinic  Burna Sis, LCSW Clinical Social Worker [facility] Clinic Desk#: [phone] Cell#: [phone]

## 2021-03-19 NOTE — Progress Notes (Incomplete)
Advanced [facility] Failure Clinic Note   PCP: Pcp, No HF Cardiologist: Dr. Shirlee Latch   Reason for Visit:  F/u for Chronic Systolic HF and VT   HPI: Darryl Mitchell is a 45 y.o. with history of NICM dating back to 2016 s/p SQ [facility] ICD, heavy ETOH abuse, DMII, OSA, HTN, and tobacco abuse.  Had been sober x 2 years but relapsed 4/22. He was drinking 2 gallons of vodka a day and poor compliance w/ home meds. Had been living in [place] but planning to permanently move to [place] to live w/ his mother.   He was admitted to [facility] 6/22 w/ ? seizure activity, VT/VF requiring external/internal shocks in the field.  EP consulted and felt VF in the setting of ETOH abuse + hypokalemia/hypomagnesemia. K/Mag replaced. Initially on amio drip but later stopped due to QT prolongation. Required phenobarbital for ETOH withdrawal. Echo showed severely reduced LVEF at 10% and severely reduced RV systolic function. Ischemic CM felt to be unlikely.   Rhythm stabilized and he had no further VT/VF. He diuresed well w/ IV Lasix, down 20 lb. Transitioned to PO torsemide. GDMT initiated w/ the exception ARNi given h/o angioedema w/ lisinopril. He was started on losartan. D/c wt 218 lb.   Echo 10/22 showed EF up to 50% with moderate LVH and normal RV.   Today he returns for HF follow up. He is more SOB x 3-4 days. He has been off his [place] for 3-4 weeks after running out. Feels some palpitations with anxiety/panic. Denies CP, dizziness, edema, or PND/Orthopnea. Appetite ok. No fever or chills. Weight at home 227 pounds. Drinking 24 oz beer couple times a week. He is staying with his mother but "she is no longer involved in my medications." Having trouble affording some of his medications, planning on starting to do part time work soon. Wants to start boxing for exercise, currently doing cardio on TM.  Labs (9/22): K 5.1, creatinine 1.45  Labs (10/22): K 4.3, creatinine 1.49  PMH: 1. ETOH abuse.  2. H/o VT: [employer]  subcutaneous ICD.  3. Chronic systolic CHF: Suspect ETOH cardiomyopathy, noted since 2016. Reports prior cath (outside facility) with no significant CAD.  EF waxes and wanes with ETOH use then cutting back on ETOH.  - Echo (6/22): EF 10%, severe LV dilation, severely decreased RV function with moderate RVE, mild MR. - Echo (10/22): EF 50%, moderate LVH, normal RV.  4. HTN 5. DM2 6. Smoker 7. CKD stage 3  Review of systems complete and found to be negative unless listed in HPI.    Current Outpatient Medications  Medication Sig Dispense Refill   aspirin 81 MG EC tablet Take 1 tablet (81 mg total) by mouth daily. Swallow whole. 30 tablet 11   carvedilol (COREG) 6.25 MG tablet Take 3 tablets (18.75 mg total) by mouth 2 (two) times daily with a meal. 180 tablet 11   dapagliflozin propanediol (FARXIGA) 10 MG TABS tablet Take 1 tablet (10 mg total) by mouth daily. 180 tablet 3   diphenhydrAMINE (BENADRYL) 25 MG tablet Take 150 mg by mouth at bedtime as needed for allergies or sleep.     folic acid (FOLVITE) 1 MG tablet Take 1 tablet (1 mg total) by mouth daily.     hydrALAZINE (APRESOLINE) 25 MG tablet Take 1 tablet (25 mg total) by mouth 3 (three) times daily. 90 tablet 11   isosorbide mononitrate (IMDUR) 30 MG 24 hr tablet Take 1 tablet (30 mg total) by mouth  daily. 30 tablet 11   losartan (COZAAR) 25 MG tablet Take 1 tablet (25 mg total) by mouth in the morning and at bedtime. 180 tablet 3   Multiple Vitamin (MULTIVITAMIN WITH MINERALS) TABS tablet Take 1 tablet by mouth daily.     potassium chloride SA (KLOR-CON M) 20 MEQ tablet Take 2 tablets (40 mEq total) by mouth daily. 60 tablet 11   rosuvastatin (CRESTOR) 20 MG tablet Take 2 tablets (40 mg total) by mouth at bedtime. 60 tablet 2   thiamine 100 MG tablet Take 1 tablet (100 mg total) by mouth daily.     torsemide (DEMADEX) 20 MG tablet Take 2 tablets (40 mg total) by mouth daily. 60 tablet 11   traZODone (DESYREL) 50 MG tablet Take 50 mg  by mouth at bedtime as needed for sleep.     No current facility-administered medications for this visit.   Allergies  Allergen Reactions   Lisinopril Swelling    Social History   Socioeconomic History   Marital status: Divorced    Spouse name: Not on file   Number of children: Not on file   Years of education: Not on file   Highest education level: Not on file  Occupational History   Not on file  Tobacco Use   Smoking status: Every Day    Packs/day: 3.00    Types: Cigarettes   Smokeless tobacco: Current  Substance and Sexual Activity   Alcohol use: Yes    Comment: 2 gallons of Vodka   Drug use: Not on file   Sexual activity: Not on file  Other Topics Concern   Not on file  Social History Narrative   Not on file   Social Determinants of Health   Financial Resource Strain: Medium Risk   Difficulty of Paying Living Expenses: Somewhat hard  Food Insecurity: No Food Insecurity   Worried About Running Out of Food in the Last Year: Never true   Ran Out of Food in the Last Year: Never true  Transportation Needs: Unmet Transportation Needs   Lack of Transportation (Medical): Yes   Lack of Transportation (Non-Medical): Yes  Physical Activity: Not on file  Stress: Not on file  Social Connections: Not on file  Intimate Partner Violence: Not on file   No family history on file.  There were no vitals taken for this visit.  Wt Readings from Last 3 Encounters:  02/09/21 105.4 kg (232 lb 6.4 oz)  12/29/20 101.7 kg (224 lb 3.2 oz)  12/03/20 106.6 kg (235 lb)   PHYSICAL EXAM: General:  NAD. No resp difficulty HEENT: Normal Neck: Supple. JVP to jaw. Carotids 2+ bilat; no bruits. No lymphadenopathy or thryomegaly appreciated. Cor: PMI nondisplaced. Regular rate & rhythm. No rubs, gallops or murmurs. Lungs: Clear Abdomen: Soft, nontender, nondistended. No hepatosplenomegaly. No bruits or masses. Good bowel sounds. Extremities: No cyanosis, clubbing, rash, 1+ BLE  edema Neuro: Alert & oriented x 3, cranial nerves grossly intact. Moves all 4 extremities w/o difficulty. Affect pleasant.  ASSESSMENT & PLAN: 1.Chronic systolic CHF: Biventricular failure, followed in [place] in the past with diagnosis in 2016.  Echoes have shown EF up and down, appears to fluctuate with medication compliance and ETOH intake. Possible ETOH cardiomyopathy though HTN may play a role. Echo at [facility] 6/22 w/ EF 10%, severe LV dilation, severely reduced RV systolic function, mild MR.  This was in setting of heavy ETOH abuse.  Self reports prior cath without CAD, though records are not available to [place]  for review.  Has [employer] ICD.  Echo 10/22 EF up to 50%, he has cut back a lot on ETOH use.  NYHA III, he is volume overloaded on exam today, weight up 8 lbs. - Restart Farxiga 10 mg daily. BMET/BNP today, repeat BMET in 10 days. - Continue torsemide 40 mg daily, decrease KCl to 40 daily. BMET today. - Continue losartan 25 mg bid. No Entresto given h/o angioedema w/ lisinopril . - Continue Coreg 18.75 mg bid.  - Restart hydralazine 25 mg tid and Imdur 30 mg daily.   - Imperative to limit ETOH.  EF tracks inversely with ETOH use.  2.   H/o VT/VF: admit 6/22 w/ VT/VF w/ multiple ICD shocks, in setting of ETOH intoxication, hypokalemia and hypomagnesia. Initially placed on amio but discontinued due to QT prolongation.  He has a [employer] ICD and denies any further shocks.  - Continue Coreg 18.75 mg bid. - Needs to completely abstain from ETOH.  - No driving for 6 months (~12/22). 3.   H/o ETOH Abuse:  He has cut back significantly.   - Reiterated the importance of abstinence given cardiac issues and discussed seeking professional help for this. 4. Type 2 DM - Restart Farxiga.  5. HTN: Elevated BP recently.  - Restart hydralazine/Imdur.  6. HLD: LDL 172 most recently.  - Restart Crestor 20 mg daily with lipids/LFTs in 2 months. Rx sent to [facility], $3 for  first 30-day supply, then $16 thereafter. 7. Medication non-compliance: Refer to paramedicine. He was given a list of PCPs today to establish care.  Follow up with APP in 4-6 weeks.   [place], FNP 03/19/21

## 2021-03-23 ENCOUNTER — Encounter (HOSPITAL_COMMUNITY): Payer: Medicare Other

## 2021-03-25 ENCOUNTER — Telehealth (HOSPITAL_COMMUNITY): Payer: Self-pay | Admitting: Surgery

## 2021-03-25 NOTE — Telephone Encounter (Signed)
Information sent for patient to be charged for home sleep study device.  Patient has not completed the study nor returned the device after several reminder phone calls. °

## 2021-04-05 ENCOUNTER — Other Ambulatory Visit (HOSPITAL_COMMUNITY): Payer: Self-pay | Admitting: Cardiology

## 2021-07-01 DIAGNOSIS — I5042 Chronic combined systolic (congestive) and diastolic (congestive) heart failure: Secondary | ICD-10-CM

## 2021-07-15 ENCOUNTER — Other Ambulatory Visit (HOSPITAL_COMMUNITY): Payer: Self-pay

## 2022-04-05 IMAGING — DX DG CHEST 2V
2 series · 2 of 2 positions shown · non-contrast
Comparison: Radiograph 09/05/2020

CLINICAL DATA: Shortness of breath

EXAM:
CHEST - 2 VIEW

[chest pa]
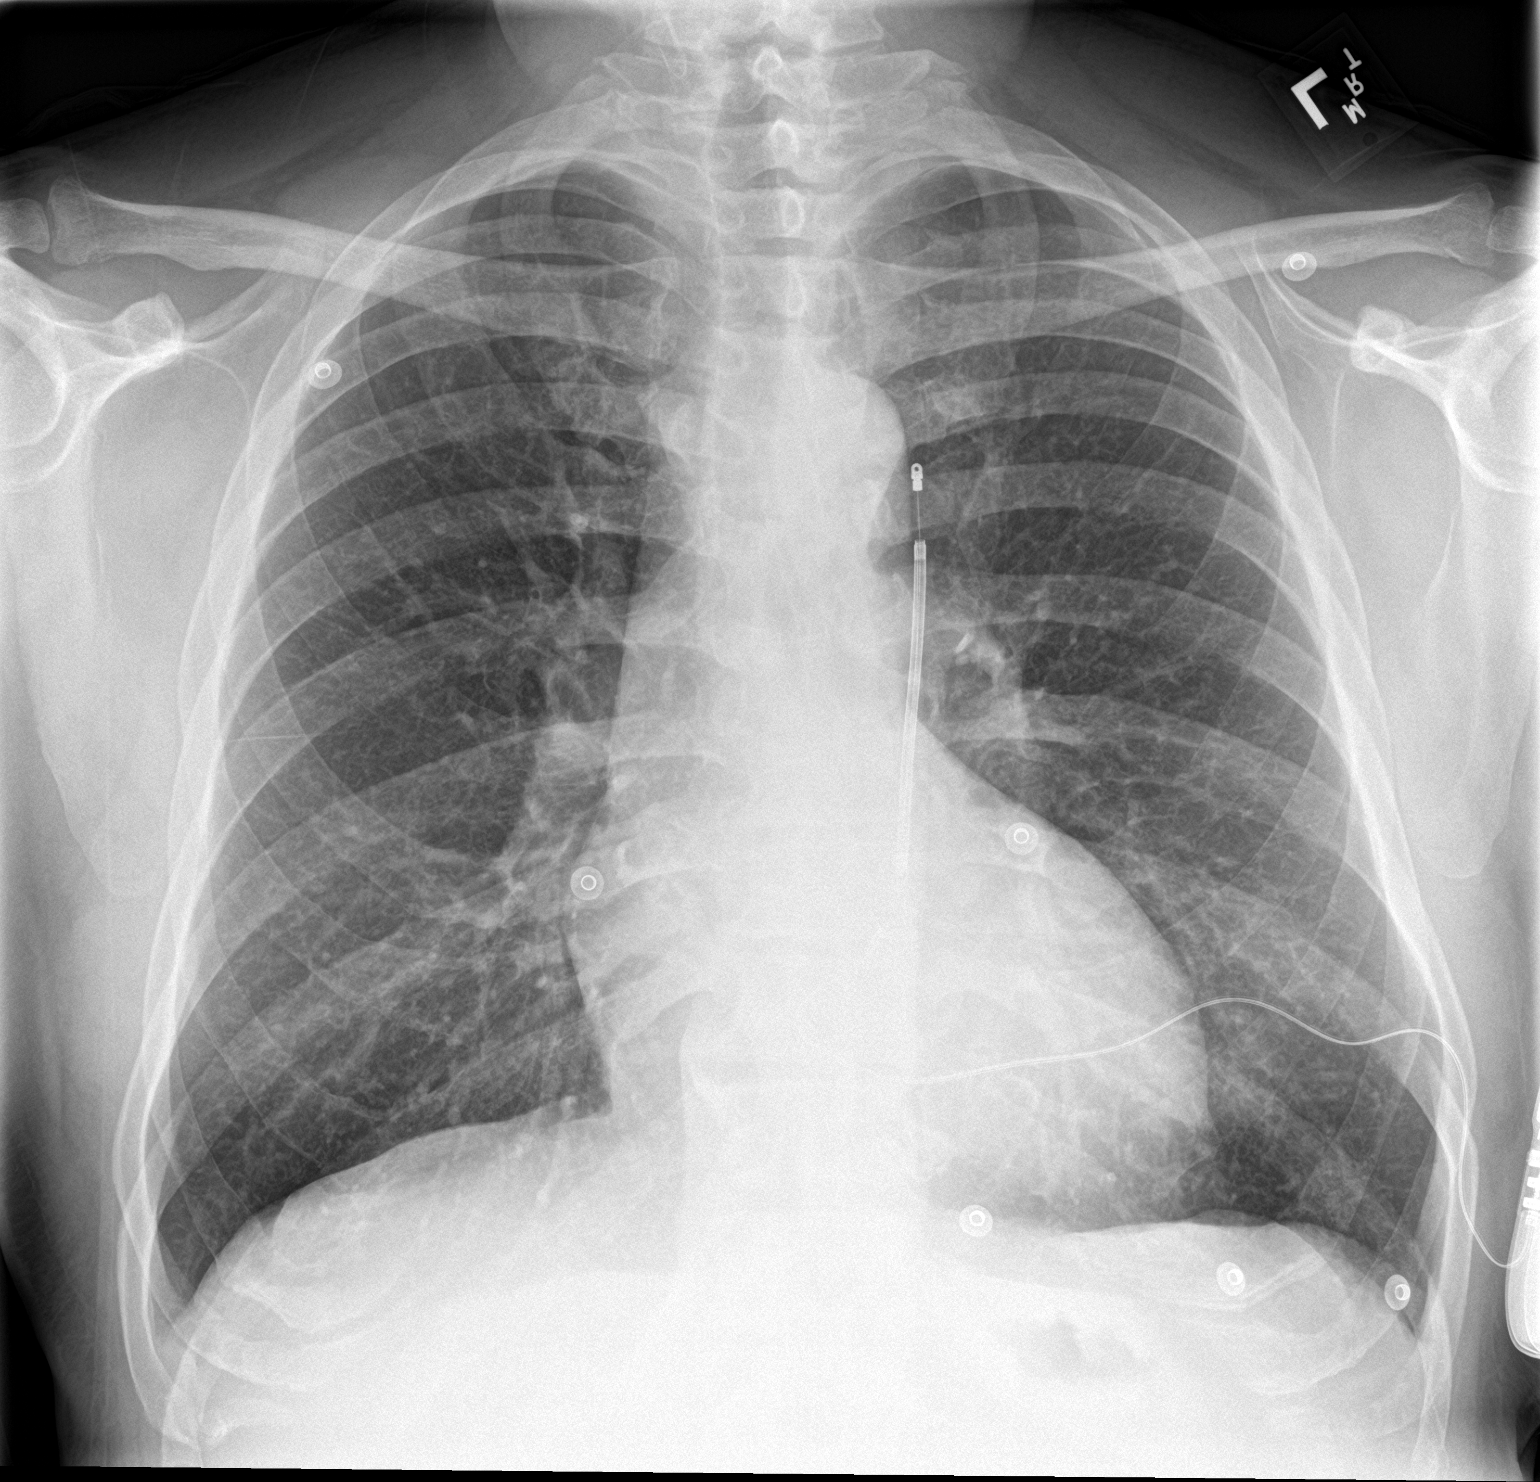

[chest lat]
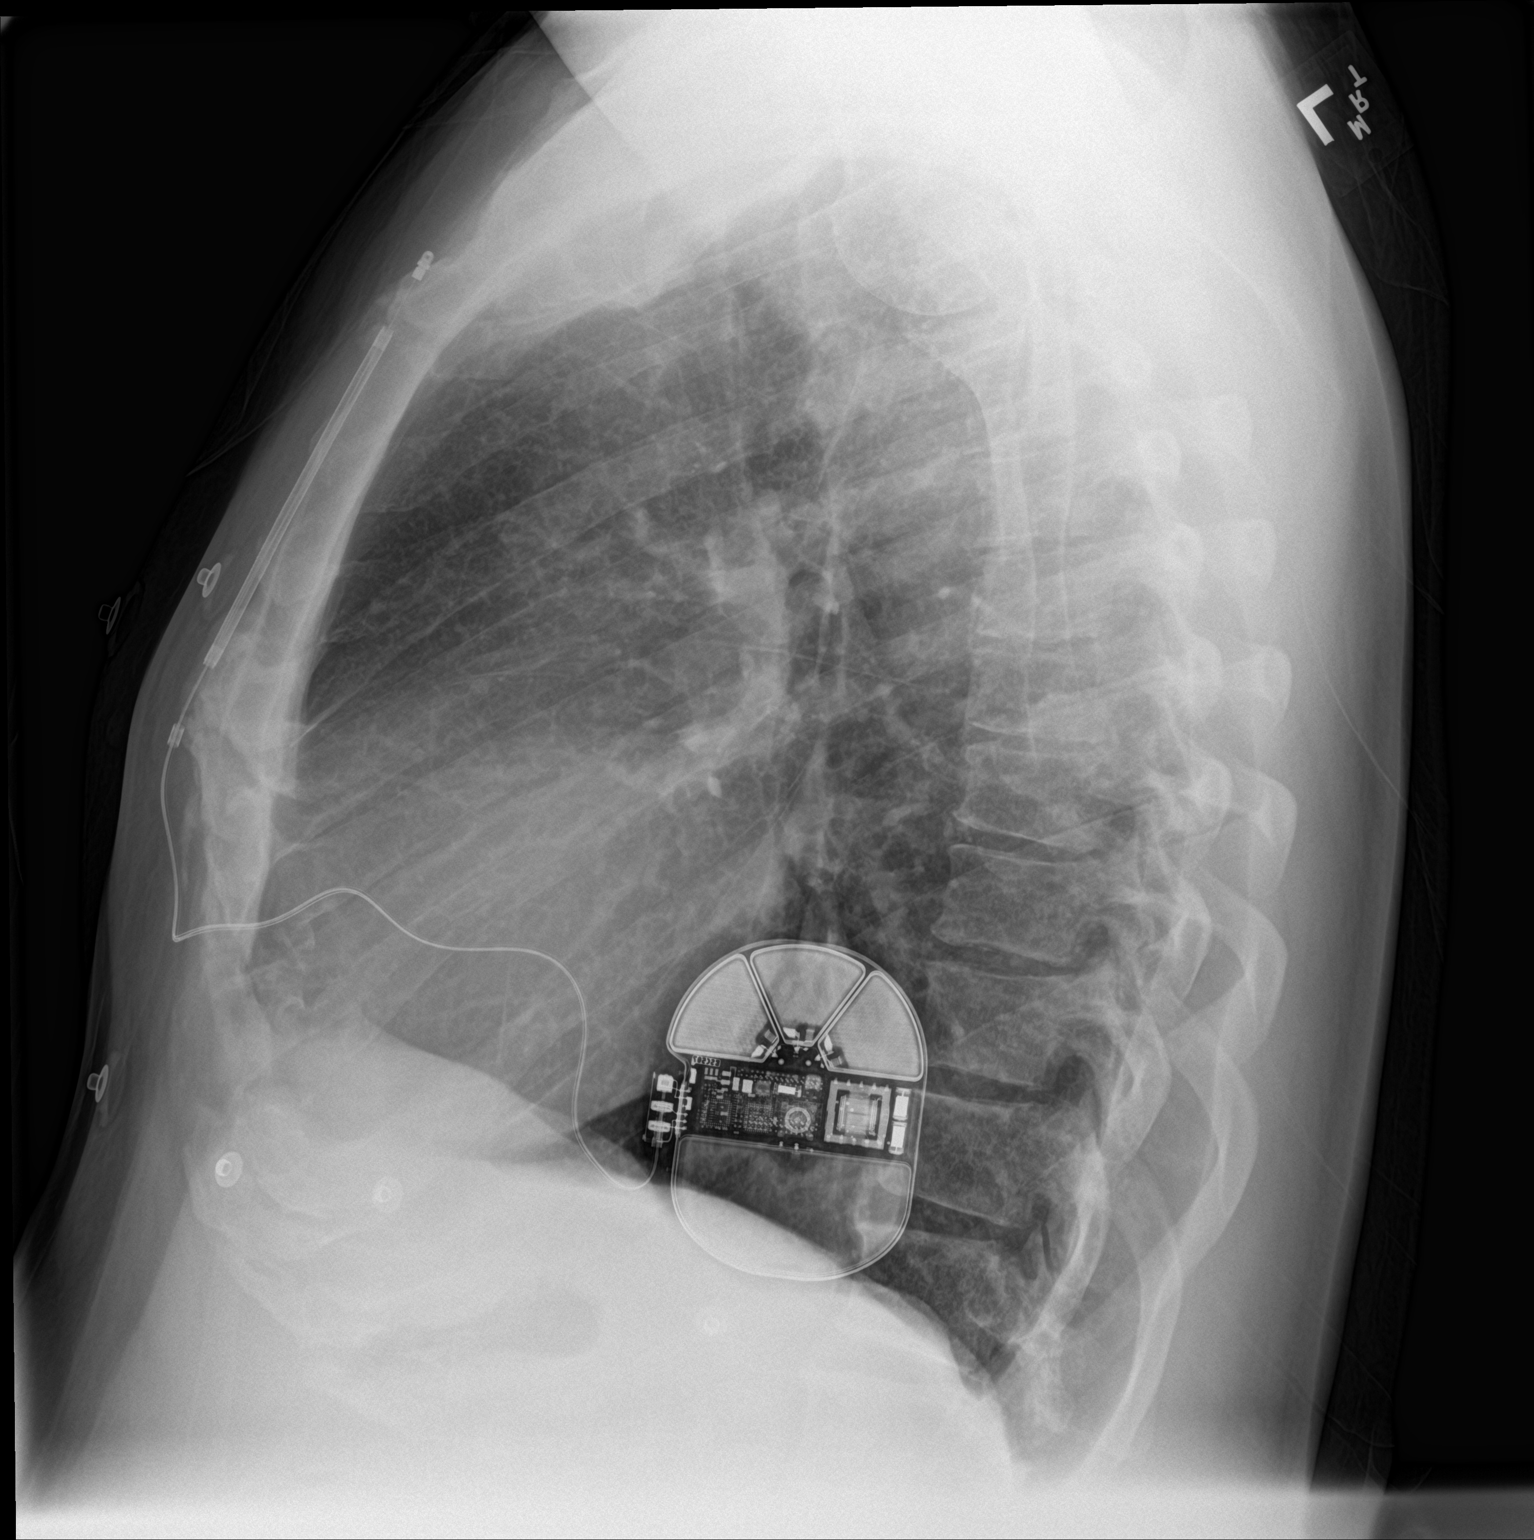

[2 of 2 positions shown; findings below may reference images not displayed]

FINDINGS: Unchanged cardiomediastinal silhouette. Unchanged subcutaneous AICD
lead. There is no new focal airspace disease. There is no large
pleural effusion or visible pneumothorax. There is no acute osseous
abnormality. Thoracic spondylosis.
IMPRESSION: No evidence of acute cardiopulmonary disease.

## 2023-01-04 ENCOUNTER — Emergency Department (HOSPITAL_COMMUNITY)
Admission: EM | Admit: 2023-01-04 | Discharge: 2023-01-05 | Disposition: A | Discharge status: Home / Self Care | Payer: Medicare Other | Attending: Emergency Medicine | Admitting: Emergency Medicine

## 2023-01-04 ENCOUNTER — Encounter (HOSPITAL_COMMUNITY): Payer: Self-pay | Admitting: Emergency Medicine

## 2023-01-04 DIAGNOSIS — I110 Hypertensive heart disease with heart failure: Secondary | ICD-10-CM | POA: Insufficient documentation

## 2023-01-04 DIAGNOSIS — R600 Localized edema: Secondary | ICD-10-CM | POA: Diagnosis not present

## 2023-01-04 DIAGNOSIS — Z7982 Long term (current) use of aspirin: Secondary | ICD-10-CM | POA: Diagnosis not present

## 2023-01-04 DIAGNOSIS — I509 Heart failure, unspecified: Secondary | ICD-10-CM | POA: Diagnosis not present

## 2023-01-04 DIAGNOSIS — R140 Abdominal distension (gaseous): Secondary | ICD-10-CM

## 2023-01-04 DIAGNOSIS — R109 Unspecified abdominal pain: Secondary | ICD-10-CM | POA: Insufficient documentation

## 2023-01-04 DIAGNOSIS — R0602 Shortness of breath: Secondary | ICD-10-CM | POA: Insufficient documentation

## 2023-01-04 DIAGNOSIS — Z79899 Other long term (current) drug therapy: Secondary | ICD-10-CM | POA: Insufficient documentation

## 2023-01-04 HISTORY — DX: Unspecified cirrhosis of liver: K74.60

## 2023-01-04 LAB — COMPREHENSIVE METABOLIC PANEL
ALT: 13 U/L (ref 0–44)
AST: 32 U/L (ref 15–41)
Albumin: 2.1 g/dL — ABNORMAL LOW (ref 3.5–5.0)
Alkaline Phosphatase: 126 U/L (ref 38–126)
Anion gap: 9 (ref 5–15)
BUN: 7 mg/dL (ref 6–20)
CO2: 26 mmol/L (ref 22–32)
Calcium: 8.2 mg/dL — ABNORMAL LOW (ref 8.9–10.3)
Chloride: 104 mmol/L (ref 98–111)
Creatinine, Ser: 0.95 mg/dL (ref 0.61–1.24)
GFR, Estimated: >60 mL/min (ref >60)
Glucose, Bld: 242 mg/dL — ABNORMAL HIGH (ref 70–99)
Potassium: 3.1 mmol/L — ABNORMAL LOW (ref 3.5–5.1)
Sodium: 139 mmol/L (ref 135–145)
Total Bilirubin: 0.7 mg/dL (ref 0.3–1.2)
Total Protein: 6.5 g/dL (ref 6.5–8.1)

## 2023-01-04 LAB — CBC WITH DIFFERENTIAL/PLATELET
Abs Immature Granulocytes: 0.03 10*3/uL (ref 0.00–0.07)
Basophils Absolute: 0.1 10*3/uL (ref 0.0–0.1)
Basophils Relative: 1 %
Eosinophils Absolute: 0.4 10*3/uL (ref 0.0–0.5)
Eosinophils Relative: 5 %
HCT: 30.5 % — ABNORMAL LOW (ref 39.0–52.0)
Hemoglobin: 9.8 g/dL — ABNORMAL LOW (ref 13.0–17.0)
Immature Granulocytes: 0 %
Lymphocytes Relative: 28 %
Lymphs Abs: 2.5 10*3/uL (ref 0.7–4.0)
MCH: 27.6 pg (ref 26.0–34.0)
MCHC: 32.1 g/dL (ref 30.0–36.0)
MCV: 85.9 fL (ref 80.0–100.0)
Monocytes Absolute: 1 10*3/uL (ref 0.1–1.0)
Monocytes Relative: 12 %
Neutro Abs: 5 10*3/uL (ref 1.7–7.7)
Neutrophils Relative %: 54 %
Platelets: 236 10*3/uL (ref 150–400)
RBC: 3.55 MIL/uL — ABNORMAL LOW (ref 4.22–5.81)
RDW: 15.9 % — ABNORMAL HIGH (ref 11.5–15.5)
WBC: 9 10*3/uL (ref 4.0–10.5)
nRBC: 0 % (ref 0.0–0.2)

## 2023-01-04 NOTE — ED Triage Notes (Signed)
Pt presents as new to town. Hx of cirrhosis of liver, CHF. Usually gets paracentesis weekly. Been 2.5 weeks and feels it is getting too tight. Exhibits no respiratory distress- belly is distended. Pt has his prescriptions and is taking them. Appt with GI but cannot get in soon.

## 2023-01-05 ENCOUNTER — Emergency Department (HOSPITAL_COMMUNITY): Payer: Medicare Other

## 2023-01-05 NOTE — ED Provider Notes (Signed)
[place] EMERGENCY DEPARTMENT AT [facility] Provider Note   CSN: [id number] Arrival date & time: 01/04/23  1906     History  Chief Complaint  Patient presents with   Shortness of Breath    Darryl Mitchell is a 47 y.o. male.  HPI     This is a 47 year old male with a history of CHF, liver cirrhosis who presents requesting paracentesis.  Patient reports that he has just relocated to [place].  He is in the process of setting up primary care and gastroenterology.  Patient states that he routinely gets therapeutic paracentesis weekly.  His last paracentesis was approximately 2.5 weeks ago.  He states he has begun to feel tight and full.  He states that he is uncomfortable.  Also reports some shortness of breath.  No fevers or cough.  No nausea or vomiting.  Home Medications Prior to Admission medications   Medication Sig Start Date End Date Taking? Authorizing Provider  aspirin 81 MG EC tablet Take 1 tablet (81 mg total) by mouth daily. Swallow whole. 02/09/21   [place], Anderson Malta, FNP  carvedilol (COREG) 6.25 MG tablet Take 3 tablets (18.75 mg total) by mouth 2 (two) times daily with a meal. 02/25/21   Laurey Morale, MD  dapagliflozin propanediol (FARXIGA) 10 MG TABS tablet Take 1 tablet (10 mg total) by mouth daily. 02/25/21   Laurey Morale, MD  diphenhydrAMINE (BENADRYL) 25 MG tablet Take 150 mg by mouth at bedtime as needed for allergies or sleep.    [provider]  folic acid (FOLVITE) 1 MG tablet Take 1 tablet (1 mg total) by mouth daily. 09/10/20   Pennie Banter, DO  hydrALAZINE (APRESOLINE) 25 MG tablet Take 1 tablet (25 mg total) by mouth 3 (three) times daily. 02/25/21   Laurey Morale, MD  isosorbide mononitrate (IMDUR) 30 MG 24 hr tablet Take 1 tablet (30 mg total) by mouth daily. 02/25/21 02/25/22  Laurey Morale, MD  losartan (COZAAR) 25 MG tablet Take 1 tablet (25 mg total) by mouth in the morning and at bedtime. 02/25/21   Laurey Morale, MD   Multiple Vitamin (MULTIVITAMIN WITH MINERALS) TABS tablet Take 1 tablet by mouth daily. 09/10/20   Esaw Grandchild A, DO  potassium chloride SA (KLOR-CON M) 20 MEQ tablet Take 2 tablets (40 mEq total) by mouth daily. 02/25/21   Laurey Morale, MD  rosuvastatin (CRESTOR) 20 MG tablet Take 2 tablets (40 mg total) by mouth at bedtime. 02/25/21 02/25/22  Laurey Morale, MD  thiamine 100 MG tablet Take 1 tablet (100 mg total) by mouth daily. 09/10/20   Pennie Banter, DO  torsemide (DEMADEX) 20 MG tablet Take 2 tablets (40 mg total) by mouth daily. 02/25/21   Laurey Morale, MD  traZODone (DESYREL) 50 MG tablet Take 50 mg by mouth at bedtime as needed for sleep.    [provider]      Allergies    Lisinopril    Review of Systems   Review of Systems  Constitutional:  Negative for fever.  Respiratory:  Positive for shortness of breath.   Gastrointestinal:  Positive for abdominal distention.  All other systems reviewed and are negative.   Physical Exam Updated Vital Signs BP (!) 144/105 (BP Location: Left Arm)   Pulse 95   Temp 98.5 F (36.9 C)   Resp 18   SpO2 100%  Physical Exam Vitals and nursing note reviewed.  Constitutional:  Appearance: He is well-developed. He is not ill-appearing.  HENT:     Head: Normocephalic and atraumatic.  Eyes:     Pupils: Pupils are equal, round, and reactive to light.  Cardiovascular:     Rate and Rhythm: Normal rate and regular rhythm.     Heart sounds: Normal heart sounds. No murmur heard. Pulmonary:     Effort: Pulmonary effort is normal. No respiratory distress.     Breath sounds: Normal breath sounds. No wheezing.     Comments: No respiratory distress noted, no tachypnea, speaking in full sentences Abdominal:     General: Bowel sounds are normal.     Palpations: Abdomen is soft.     Tenderness: There is no abdominal tenderness. There is no rebound.     Comments: Distended abdomen but soft, positive fluid wave, no  tenderness to palpation  Musculoskeletal:     Cervical back: Neck supple.     Right lower leg: Edema present.     Left lower leg: Edema present.  Lymphadenopathy:     Cervical: No cervical adenopathy.  Skin:    General: Skin is warm and dry.  Neurological:     Mental Status: He is alert and oriented to person, place, and time.  Psychiatric:        Mood and Affect: Mood normal.     ED Results / Procedures / Treatments   Labs (all labs ordered are listed, but only abnormal results are displayed) Labs Reviewed  COMPREHENSIVE METABOLIC PANEL - Abnormal; Notable for the following components:      Result Value   Potassium 3.1 (*)    Glucose, Bld 242 (*)    Calcium 8.2 (*)    Albumin 2.1 (*)    All other components within normal limits  CBC WITH DIFFERENTIAL/PLATELET - Abnormal; Notable for the following components:   RBC 3.55 (*)    Hemoglobin 9.8 (*)    HCT 30.5 (*)    RDW 15.9 (*)    All other components within normal limits    EKG EKG Interpretation Date/Time:  Wednesday January 04 2023 19:23:34 EDT Ventricular Rate:  91 PR Interval:  180 QRS Duration:  88 QT Interval:  380 QTC Calculation: 467 R Axis:   -25  Text Interpretation: Sinus rhythm with occasional Premature ventricular complexes Minimal voltage criteria for LVH, may be normal variant ( R in aVL ) Inferior infarct , age undetermined Anterolateral infarct , age undetermined Abnormal ECG When compared with ECG of 02-Nov-2020 17:35, PREVIOUS ECG IS PRESENT Confirmed by Ross Marcus ([id number]) on 01/05/2023 4:39:33 AM  Radiology No results found.  Procedures Procedures    Medications Ordered in ED Medications - No data to display  ED Course/ Medical Decision Making/ A&P                                 Medical Decision Making Amount and/or Complexity of Data Reviewed Labs: ordered. Radiology: ordered.   This patient presents to the ED for concern of abdominal distention, shortness of breath, this  involves an extensive number of treatment options, and is a complaint that carries with it a high risk of complications and morbidity.  I considered the following differential and admission for this acute, potentially life threatening condition.  The differential diagnosis includes cirrhosis, pneumonia, pulmonary edema  MDM:    This is a 47 year old male who presents requesting routine therapeutic paracentesis.  Does report some discomfort  and shortness of breath.  He is nontoxic and vital signs are reassuring.  He satting 100% on room air.  He is in no respiratory distress.  He does peripherally appear to have some volume overload but his abdomen is relatively soft.  There is no tenderness.  Low suspicion for SBP.  Labs reviewed and largely unremarkable.  Chest x-ray without acute process.  I had a long discussion with the patient.  There is no indication for emergent therapeutic paracentesis at this time.  I acknowledge that he is likely uncomfortable.  I have referred him back to his primary physician to see if they can order an interventional paracentesis.  He was given return precautions to indicate emergent need.    (Labs, imaging, consults)  Labs: I Ordered, and personally interpreted labs.  The pertinent results include: CBC, CMP  Imaging Studies ordered: I ordered imaging studies including chest x-ray I independently visualized and interpreted imaging. I agree with the radiologist interpretation  Additional history obtained from chart review.  External records from outside source obtained and reviewed including prior evaluations  Cardiac Monitoring: The patient was maintained on a cardiac monitor.  If on the cardiac monitor, I personally viewed and interpreted the cardiac monitored which showed an underlying rhythm of: Sinus rhythm  Reevaluation: After the interventions noted above, I reevaluated the patient and found that they have :stayed the same  Social Determinants of Health:   lives independently  Disposition: Discharge  Co morbidities that complicate the patient evaluation  Past Medical History:  Diagnosis Date   Biventricular heart failure, NYHA class 2 (HCC)    CHF (congestive heart failure) (HCC)    ETOH abuse    Hypertension    Liver cirrhosis (HCC)    VT (ventricular tachycardia) (HCC)      Medicines No orders of the defined types were placed in this encounter.   I have reviewed the patients home medicines and have made adjustments as needed  Problem List / ED Course: Problem List Items Addressed This Visit   None Visit Diagnoses     Abdominal distention    -  Primary                   Final Clinical Impression(s) / ED Diagnoses Final diagnoses:  Abdominal distention    Rx / DC Orders ED Discharge Orders     None         Shon Baton, MD 01/05/23 [phone]

## 2023-01-05 NOTE — Discharge Instructions (Signed)
You were seen today requesting paracentesis.  There is no emergent indication for paracentesis at this time.  Call your primary office to see if you can have an urgent referral or order placed for interventional radiology guided paracentesis.  If you develop worsening shortness of breath, abdominal pain, fevers, this would be reason to be reevaluated.

## 2023-02-02 ENCOUNTER — Other Ambulatory Visit: Payer: Self-pay | Admitting: Ophthalmology

## 2023-02-02 DIAGNOSIS — H463 Toxic optic neuropathy: Secondary | ICD-10-CM

## 2023-02-15 ENCOUNTER — Other Ambulatory Visit (HOSPITAL_COMMUNITY): Payer: Self-pay | Admitting: Gastroenterology

## 2023-02-15 DIAGNOSIS — K7030 Alcoholic cirrhosis of liver without ascites: Secondary | ICD-10-CM

## 2023-02-20 ENCOUNTER — Other Ambulatory Visit (HOSPITAL_COMMUNITY): Payer: Self-pay | Admitting: Gastroenterology

## 2023-02-20 ENCOUNTER — Ambulatory Visit (HOSPITAL_COMMUNITY)
Admission: RE | Admit: 2023-02-20 | Discharge: 2023-02-20 | Disposition: A | Discharge status: Home / Self Care | Payer: Medicare Other | Source: Ambulatory Visit | Attending: Gastroenterology | Admitting: Gastroenterology

## 2023-02-20 DIAGNOSIS — K7030 Alcoholic cirrhosis of liver without ascites: Secondary | ICD-10-CM | POA: Insufficient documentation

## 2023-02-20 MED ORDER — LIDOCAINE HCL 1 % IJ SOLN
INTRAMUSCULAR | Status: AC
  Filled 2023-02-20: qty 20

## 2023-02-20 NOTE — Progress Notes (Signed)
Patient presented to IR for possible paracentesis. Limited ultrasound examination of the abdomen revealed tissue edema but no ascites amenable to drainage. No procedure performed. Images saved in Epic.  Alwyn Ren, [place] [phone] 02/20/2023, 2:17 PM

## 2023-03-15 ENCOUNTER — Encounter (HOSPITAL_COMMUNITY): Payer: Self-pay | Admitting: Cardiology

## 2023-03-15 ENCOUNTER — Ambulatory Visit (HOSPITAL_COMMUNITY)
Admission: RE | Admit: 2023-03-15 | Discharge: 2023-03-15 | Disposition: A | Discharge status: Home / Self Care | Payer: Medicare Other | Source: Ambulatory Visit | Attending: Cardiology | Admitting: Cardiology

## 2023-03-15 VITALS — BP 148/90 | HR 68 | Wt 201.8 lb

## 2023-03-15 DIAGNOSIS — Z5982 Transportation insecurity: Secondary | ICD-10-CM | POA: Insufficient documentation

## 2023-03-15 DIAGNOSIS — K7460 Unspecified cirrhosis of liver: Secondary | ICD-10-CM | POA: Insufficient documentation

## 2023-03-15 DIAGNOSIS — Z79899 Other long term (current) drug therapy: Secondary | ICD-10-CM | POA: Insufficient documentation

## 2023-03-15 DIAGNOSIS — Z5986 Financial insecurity: Secondary | ICD-10-CM | POA: Insufficient documentation

## 2023-03-15 DIAGNOSIS — I130 Hypertensive heart and chronic kidney disease with heart failure and stage 1 through stage 4 chronic kidney disease, or unspecified chronic kidney disease: Secondary | ICD-10-CM | POA: Diagnosis not present

## 2023-03-15 DIAGNOSIS — F17210 Nicotine dependence, cigarettes, uncomplicated: Secondary | ICD-10-CM | POA: Insufficient documentation

## 2023-03-15 DIAGNOSIS — Z95810 Presence of automatic (implantable) cardiac defibrillator: Secondary | ICD-10-CM | POA: Insufficient documentation

## 2023-03-15 DIAGNOSIS — I5082 Biventricular heart failure: Secondary | ICD-10-CM | POA: Insufficient documentation

## 2023-03-15 DIAGNOSIS — E785 Hyperlipidemia, unspecified: Secondary | ICD-10-CM | POA: Insufficient documentation

## 2023-03-15 DIAGNOSIS — I5022 Chronic systolic (congestive) heart failure: Secondary | ICD-10-CM | POA: Insufficient documentation

## 2023-03-15 DIAGNOSIS — I5042 Chronic combined systolic (congestive) and diastolic (congestive) heart failure: Secondary | ICD-10-CM | POA: Diagnosis not present

## 2023-03-15 LAB — BASIC METABOLIC PANEL
Anion gap: 9 (ref 5–15)
BUN: 11 mg/dL (ref 6–20)
CO2: 22 mmol/L (ref 22–32)
Calcium: 9.1 mg/dL (ref 8.9–10.3)
Chloride: 106 mmol/L (ref 98–111)
Creatinine, Ser: 0.86 mg/dL (ref 0.61–1.24)
GFR, Estimated: >60 mL/min (ref >60)
Glucose, Bld: 150 mg/dL — ABNORMAL HIGH (ref 70–99)
Potassium: 4.2 mmol/L (ref 3.5–5.1)
Sodium: 137 mmol/L (ref 135–145)

## 2023-03-15 LAB — LIPID PANEL
Cholesterol: 153 mg/dL (ref 0–200)
HDL: 57 mg/dL (ref >40)
LDL Cholesterol: 85 mg/dL (ref 0–99)
Total CHOL/HDL Ratio: 2.7 {ratio}
Triglycerides: 54 mg/dL (ref <150)
VLDL: 11 mg/dL (ref 0–40)

## 2023-03-15 LAB — BRAIN NATRIURETIC PEPTIDE: B Natriuretic Peptide: 65.7 pg/mL (ref 0.0–100.0)

## 2023-03-15 MED ORDER — SPIRONOLACTONE 25 MG PO TABS: 12.5000 mg | ORAL_TABLET | Freq: Every day | ORAL | 3 refills | Status: DC

## 2023-03-15 MED ORDER — LACTULOSE 10 GM/15ML PO SOLN
20.0000 g | Freq: Three times a day (TID) | ORAL | Status: AC
Start: 2023-03-15 — End: ?

## 2023-03-15 NOTE — Patient Instructions (Signed)
Medication Changes:  START Spironolactone 12.5 mg (1/2 tab) Daily  Lab Work:  Labs done today, your results will be available in MyChart, we will contact you for abnormal readings.  Your physician recommends that you return for lab work in: 1-2 weeks  Testing/Procedures:  Your physician has requested that you have an echocardiogram. Echocardiography is a painless test that uses sound waves to create images of your heart. It provides your doctor with information about the size and shape of your heart and how well your heart's chambers and valves are working. This procedure takes approximately one hour. There are no restrictions for this procedure. Please do NOT wear cologne, perfume, aftershave, or lotions (deodorant is allowed). Please arrive 15 minutes prior to your appointment time.  Please note: We ask at that you not bring children with you during ultrasound (echo/ vascular) testing. Due to room size and safety concerns, children are not allowed in the ultrasound rooms during exams. Our front office staff cannot provide observation of children in our lobby area while testing is being conducted. An adult accompanying a patient to their appointment will only be allowed in the ultrasound room at the discretion of the ultrasound technician under special circumstances. We apologize for any inconvenience.  Referrals:  none  Special Instructions // Education:  Do the following things EVERYDAY: Weigh yourself in the morning before breakfast. Write it down and keep it in a log. Take your medicines as prescribed Eat low salt foods--Limit salt (sodium) to 2000 mg per day.  Stay as active as you can everyday Limit all fluids for the day to less than 2 liters   Follow-Up in: 6 weeks   At the [facility], you and your health needs are our priority. We have a designated team specialized in the treatment of Heart Failure. This Care Team includes your primary Heart Failure  Specialized Cardiologist (physician), Advanced Practice Providers (APPs- Physician Assistants and Nurse Practitioners), and Pharmacist who all work together to provide you with the care you need, when you need it.   You may see any of the following providers on your designated Care Team at your next follow up:  Dr. Arvilla Meres Dr. Marca Ancona Dr. Dorthula Nettles Dr. Theresia Bough Tonye Becket, NP Robbie Lis, [place] [facility] [place], [place] Brynda Peon, NP [place], NP Karle Plumber, PharmD   Please be sure to bring in all your medications bottles to every appointment.   Need to Contact us:  If you have any questions or concerns before your next appointment please send [place] a message through [place] or call our office at [phone].    TO LEAVE A MESSAGE FOR THE NURSE SELECT OPTION 2, PLEASE LEAVE A MESSAGE INCLUDING: YOUR NAME DATE OF BIRTH CALL BACK NUMBER REASON FOR CALL**this is important as we prioritize the call backs  YOU WILL RECEIVE A CALL BACK THE SAME DAY AS LONG AS YOU CALL BEFORE 4:00 PM

## 2023-03-15 NOTE — Progress Notes (Signed)
Advanced Heart Failure Clinic Note   PCP: [facility] Cardiologist: Dr. Shirlee Latch   Reason for Visit:  F/u for Chronic Systolic HF and VT   HPI: Darryl Mitchell is a 47 y.o. with history of NICM dating back to 2016 s/p SQ [employer] ICD, heavy ETOH abuse, DMII, OSA, HTN, and tobacco abuse.  Had been sober x 2 years but relapsed 4/22. He was drinking 2 gallons of vodka a day.  He was admitted to [facility] 6/22 w/ ? seizure activity, VT/VF requiring shocks in the field.  EP consulted and felt VF in the setting of ETOH abuse + hypokalemia/hypomagnesemia. K/Mag replaced. Initially on amio drip but later stopped due to QT prolongation. Required phenobarbital for ETOH withdrawal. Echo showed severely reduced LVEF at 10% and severely reduced RV systolic function. Ischemic CMP felt to be unlikely.  Rhythm stabilized and he had no further VT/VF. He diuresed well w/ IV Lasix, down 20 lb. Transitioned to PO torsemide. GDMT initiated w/ the exception ARNi given h/o angioedema w/ lisinopril. He was started on losartan. D/c wt 218 lb.   Echo 10/22 showed EF up to 50% with moderate LVH and normal RV.   He was lost to followup for 2 years when he returned to [place].  He started drinking again.  He says that cirrhosis was diagnosed there and he had multiple paracenteses.    He recently moved back to [place] and was seen in the ER for peripheral edema and abdominal distention.  He felt like he needed a paracentesis.  However, abdominal US did not show enough ascites.  He has been off all ETOH for about 5 months.  He is smoking 4 cigarettes/day. He still has peripheral edema and takes torsemide 20 mg daily.  He is on no other cardiac meds, he says that physicians in [place] stopped his other meds.  He was referred back to [place] by the ER for CHF evaluation.  Besides peripheral edema, he does not have many other signs/symptoms of CHF.  He works out daily at [employer] and walks 30-40 minutes/day.  He has no  chest pain or tightness, no dyspnea with normal activities.  No lightheadedness. BP is elevated today.   Labs (9/22): K 5.1, creatinine 1.45  Labs (10/22): K 4.3, creatinine 1.49 Labs (10/24): K 3.1, creatinine 0.95, LFTs normal, albumen 2.1  PMH: 1. Cirrhosis: With ascites and h/o paracenteses. Likely due to ETOH abuse.  2. H/o VT: [employer] subcutaneous ICD.  3. Chronic systolic CHF: Suspect ETOH cardiomyopathy, noted since 2016. Reports prior cath (outside facility) with no significant CAD.  EF waxes and wanes with ETOH use then cutting back on ETOH.  - Echo (6/22): EF 10%, severe LV dilation, severely decreased RV function with moderate RVE, mild MR. - Echo (10/22): EF 50%, moderate LVH, normal RV.  4. HTN 5. DM2 6. Smoker 7. CKD stage 3  Review of systems complete and found to be negative unless listed in HPI.    Current Outpatient Medications  Medication Sig Dispense Refill   aspirin 81 MG EC tablet Take 1 tablet (81 mg total) by mouth daily. Swallow whole. 30 tablet 11   folic acid (FOLVITE) 1 MG tablet Take 1 tablet (1 mg total) by mouth daily.     lactulose (CHRONULAC) 10 GM/15ML solution Take 30 mLs (20 g total) by mouth 3 (three) times daily.     spironolactone (ALDACTONE) 25 MG tablet Take 0.5 tablets (12.5 mg total) by mouth  daily. 15 tablet 3   thiamine 100 MG tablet Take 1 tablet (100 mg total) by mouth daily.     torsemide (DEMADEX) 20 MG tablet Take 20 mg by mouth daily.     traZODone (DESYREL) 50 MG tablet Take 50 mg by mouth at bedtime as needed for sleep.     potassium chloride SA (KLOR-CON M) 20 MEQ tablet Take 2 tablets (40 mEq total) by mouth daily. 60 tablet 11   No current facility-administered medications for this encounter.   Allergies  Allergen Reactions   Lisinopril Swelling    Social History   Socioeconomic History   Marital status: Divorced    Spouse name: Not on file   Number of children: Not on file   Years of education: Not on file    Highest education level: Not on file  Occupational History   Not on file  Tobacco Use   Smoking status: Every Day    Current packs/day: 3.00    Types: Cigarettes   Smokeless tobacco: Current  Substance and Sexual Activity   Alcohol use: Yes    Comment: 2 gallons of Vodka   Drug use: Not on file   Sexual activity: Not on file  Other Topics Concern   Not on file  Social History Narrative   Not on file   Social Drivers of Health   Financial Resource Strain: Medium Risk (09/07/2020)   Overall Financial Resource Strain (CARDIA)    Difficulty of Paying Living Expenses: Somewhat hard  Food Insecurity: No Food Insecurity (09/07/2020)   Hunger Vital Sign    Worried About Running Out of Food in the Last Year: Never true    Ran Out of Food in the Last Year: Never true  Transportation Needs: Unmet Transportation Needs (09/08/2020)   PRAPARE - Administrator, Civil Service (Medical): Yes    Lack of Transportation (Non-Medical): Yes  Physical Activity: Not on file  Stress: Not on file  Social Connections: Not on file  Intimate Partner Violence: Not on file   History reviewed. No pertinent family history.  BP (!) 148/90   Pulse 68   Wt 91.5 kg (201 lb 12.8 oz)   SpO2 99%   BMI 27.75 kg/m   Wt Readings from Last 3 Encounters:  03/15/23 91.5 kg (201 lb 12.8 oz)  02/09/21 105.4 kg (232 lb 6.4 oz)  12/29/20 101.7 kg (224 lb 3.2 oz)   PHYSICAL EXAM: General: NAD Neck: No JVD, no thyromegaly or thyroid nodule.  Lungs: Clear to auscultation bilaterally with normal respiratory effort. CV: Nondisplaced PMI.  Heart regular S1/S2, no S3/S4, no murmur.  1+ ankle edema.  No carotid bruit.  Normal pedal pulses.  Abdomen: Soft, nontender, no hepatosplenomegaly, no distention.  Skin: Intact without lesions or rashes.  Neurologic: Alert and oriented x 3.  Psych: Normal affect. Extremities: No clubbing or cyanosis.  HEENT: Normal.   ASSESSMENT & PLAN: 1.Chronic systolic CHF:  Biventricular failure, followed in [place] in the past with diagnosis in 2016.  Echoes have shown EF up and down, appears to fluctuate with medication compliance and ETOH intake. Possible ETOH cardiomyopathy though HTN may play a role. Echo at [facility] 6/22 w/ EF 10%, severe LV dilation, severely reduced RV systolic function, mild MR.  This was in setting of heavy ETOH abuse.  Self reports prior cath without CAD, though records are not available to [place] for review.  Has [employer] ICD.  Echo 10/22 EF up to 50% when  he had cut back on ETOH.  Lost to followup for 2 years, now returning with peripheral edema.  He quit drinking again about 5 months ago. NYHA class I-II, no JVD.  - He has been on torsemide 20 mg daily long-term, will continue.   - Start spironolactone 12.5 mg daily with history of CHF and also cirrhosis with ascites. BMET/BNP today, BMET in 10 days.  - No Entresto or ACEI given h/o angioedema w/ lisinopril . - I will arrange for echo.  - Imperative to stay off ETOH.  EF tracks inversely with ETOH use.  2. VT/VF: admit 6/22 w/ VT/VF w/ multiple ICD shocks, in setting of ETOH intoxication, hypokalemia and hypomagnesia. Initially placed on amiodarone but discontinued due to QT prolongation.  He has a Environmental manager SQ ICD and denies any further shocks.  - Needs device clinic followup, will arrange.  3.  H/o ETOH Abuse:  He says that he has quit.   - Reiterated the importance of abstinence given cardiac issues and cirrhosis. 4. HTN: Elevated BP today.  - Starting spironolactone.   5. HLD: On Crestor in the past.  - Check lipids today.  6. Smoking: I urged him to quit.  7. Cirrhosis: Likely due to ETOH.  History of paracenteses. He is on lactulose.  He says he has an appointment with [facility].   Followup 6 wks with APP.   Marca Ancona, MD 03/15/23

## 2023-03-17 NOTE — Addendum Note (Signed)
Encounter addended by: Noralee Space, RN on: 03/17/2023 4:56 PM  Actions taken: Visit diagnoses modified, Order list changed, Diagnosis association updated

## 2023-03-30 ENCOUNTER — Other Ambulatory Visit (HOSPITAL_COMMUNITY): Payer: Medicare Other

## 2023-04-04 ENCOUNTER — Ambulatory Visit (HOSPITAL_COMMUNITY)
Admission: RE | Admit: 2023-04-04 | Discharge: 2023-04-04 | Disposition: A | Discharge status: Home / Self Care | Payer: Medicare Other | Source: Ambulatory Visit | Attending: Ophthalmology | Admitting: Ophthalmology

## 2023-04-04 DIAGNOSIS — H463 Toxic optic neuropathy: Secondary | ICD-10-CM | POA: Diagnosis present

## 2023-04-04 MED ORDER — GADOBUTROL 1 MMOL/ML IV SOLN
9.0000 mL | Freq: Once | INTRAVENOUS | Status: AC | PRN
  Administered 2023-04-04: 9 mL via INTRAVENOUS

## 2023-04-06 ENCOUNTER — Ambulatory Visit (HOSPITAL_COMMUNITY): Payer: Medicare Other

## 2023-04-11 NOTE — Progress Notes (Signed)
 Electrophysiology Office Note:   Date:  04/12/2023  ID:  Darryl Mitchell, DOB 01/22/1976, MRN [id number]  Primary Cardiologist: None Primary Heart Failure: None Electrophysiologist: Manya Sells, MD       History of Present Illness:   Darryl Mitchell is a 48 y.o. male with h/o NICM s/p S-ICD, HTN, VT/VF, ETOH abuse (up to 2 gallons of vodka per day per prior notes), tobacco abuse, OSA,  seen today for routine electrophysiology followup.   Last seen in 2022 by Dr. Carolynne Citron during hospitalization for "seizure-like activity" in setting of sustained VT/VF requiring both external and ICD defibrillation. Reported relapse of ETOH at that time with possible withdrawal after cessation. He was started on amio but it was subsequently held out of concern for QT issues / Torsades in setting of hypokalemia.   He has unfortunately been lost to EP follow up since 2022. He had apparently returned to [place]  and was diagnosed with cirrhosis requiring multiple paracentesis. He was seen int he ER in 12/2022 for abdominal distention, LE edema. He had been sober for 5 months at that time. He followed up with advanced HF in clinic 03/15/23 and was restarted on spironolactone , torsemide  continued.  He was not a candidate for ACE-I or Entresto due to angioedema.  He was referred back to EP.   Since last being seen in our clinic the patient reports doing well in the recent months.  He just started back on his medications about 4 days ago.  Denies significant swelling in abd or legs.  Reports LE's are at baseline.  He is not working currently.    He denies chest pain, palpitations, dyspnea, PND, orthopnea, nausea, vomiting, dizziness, syncope, edema, weight gain, or early satiety.   Review of systems complete and found to be negative unless listed in HPI.   EP Information / Studies Reviewed:    EKG is not ordered today. EKG from 03/15/23 reviewed which showed NSR 60 bpm      ICD Interrogation-  reviewed in detail today,  See  PACEART report.  Device History: Environmental manager S-ICD ICD implanted ~2016 or 2018 initially for NICM / VF > Previously followed in [place], [place]  with Dr. Junior Olea.  New SQ ICD generator inserted 12/03/2020 by Dr. Carolynne Citron History of appropriate therapy: Yes History of AAD therapy: No       Studies:  ECHO 12/2020 > LVEF 50%, G1 DD ECHO 03/25/23 >     Arrhythmia / AAD VT    Physical Exam:   VS:  BP (!) 140/100   Pulse 77   Ht 5' 11.5" (1.816 m)   Wt 198 lb 6.4 oz (90 kg)   SpO2 98%   BMI 27.29 kg/m    Wt Readings from Last 3 Encounters:  04/12/23 198 lb 6.4 oz (90 kg)  03/15/23 201 lb 12.8 oz (91.5 kg)  02/09/21 232 lb 6.4 oz (105.4 kg)     GEN: Well nourished, well developed in no acute distress NECK: No JVD; No carotid bruits CARDIAC: Regular rate and rhythm, no murmurs, rubs, gallops RESPIRATORY:  Clear to auscultation without rales, wheezing or rhonchi  ABDOMEN: Soft, non-tender, non-distended, no significant fluid wave EXTREMITIES:  LE 1-2+ pitting edema; No deformity   ASSESSMENT AND PLAN:    Chronic Systolic Dysfunction, NICM s/p Environmental manager S-ICD  -euvolemic today -Stable on an appropriate medical regimen -Normal ICD function -See [facility] report -No changes today -Industry working on getting his linked with his remote monitoring device  -brief episode of  AF noted from 06/2022 but none since   HTN  -just started back on agents 4 days ago, will hold changes for now  -BP elevated in clinic, monitor for now  CKD III -per primary   Cirrhosis  With ascites, s/p paracentesis in setting of ETOH abuse  -supportive care  -ETOH cessation encouraged  -per Primary   Disposition:   Follow up with Dr. Carolynne Citron in 12 months   Signed, Creighton Doffing, MSN, APRN, NP-C, AGACNP-BC [facility] - Electrophysiology  04/12/2023, 4:23 PM

## 2023-04-12 ENCOUNTER — Ambulatory Visit (INDEPENDENT_AMBULATORY_CARE_PROVIDER_SITE_OTHER): Payer: Medicare Other | Admitting: Pulmonary Disease

## 2023-04-12 ENCOUNTER — Encounter: Payer: Self-pay | Admitting: Pulmonary Disease

## 2023-04-12 ENCOUNTER — Ambulatory Visit
Admission: RE | Admit: 2023-04-12 | Discharge: 2023-04-12 | Disposition: A | Discharge status: Home / Self Care | Payer: Medicare Other | Source: Ambulatory Visit | Attending: Cardiology | Admitting: Cardiology

## 2023-04-12 VITALS — BP 140/100 | HR 77 | Ht 71.5 in | Wt 198.4 lb

## 2023-04-12 DIAGNOSIS — K7460 Unspecified cirrhosis of liver: Secondary | ICD-10-CM | POA: Insufficient documentation

## 2023-04-12 DIAGNOSIS — Z95810 Presence of automatic (implantable) cardiac defibrillator: Secondary | ICD-10-CM

## 2023-04-12 DIAGNOSIS — I5042 Chronic combined systolic (congestive) and diastolic (congestive) heart failure: Secondary | ICD-10-CM | POA: Diagnosis not present

## 2023-04-12 DIAGNOSIS — F17210 Nicotine dependence, cigarettes, uncomplicated: Secondary | ICD-10-CM | POA: Diagnosis not present

## 2023-04-12 DIAGNOSIS — N183 Chronic kidney disease, stage 3 unspecified: Secondary | ICD-10-CM | POA: Insufficient documentation

## 2023-04-12 DIAGNOSIS — F1010 Alcohol abuse, uncomplicated: Secondary | ICD-10-CM | POA: Insufficient documentation

## 2023-04-12 DIAGNOSIS — R188 Other ascites: Secondary | ICD-10-CM | POA: Insufficient documentation

## 2023-04-12 DIAGNOSIS — I428 Other cardiomyopathies: Secondary | ICD-10-CM | POA: Diagnosis not present

## 2023-04-12 DIAGNOSIS — I129 Hypertensive chronic kidney disease with stage 1 through stage 4 chronic kidney disease, or unspecified chronic kidney disease: Secondary | ICD-10-CM | POA: Diagnosis not present

## 2023-04-12 DIAGNOSIS — I5082 Biventricular heart failure: Secondary | ICD-10-CM | POA: Diagnosis not present

## 2023-04-12 LAB — CUP PACEART INCLINIC DEVICE CHECK
Date Time Interrogation Session: 20250115162545
Implantable Lead Connection Status: 753985
Implantable Lead Implant Date: 20170220
Implantable Lead Location: 753862
Implantable Lead Model: 3401
Implantable Pulse Generator Implant Date: 20220908
Pulse Gen Serial Number: 166835

## 2023-04-12 LAB — BASIC METABOLIC PANEL
Anion gap: 7 (ref 5–15)
BUN: 13 mg/dL (ref 6–20)
CO2: 22 mmol/L (ref 22–32)
Calcium: 8.7 mg/dL — ABNORMAL LOW (ref 8.9–10.3)
Chloride: 108 mmol/L (ref 98–111)
Creatinine, Ser: 1.09 mg/dL (ref 0.61–1.24)
GFR, Estimated: >60 mL/min (ref >60)
Glucose, Bld: 377 mg/dL — ABNORMAL HIGH (ref 70–99)
Potassium: 4.6 mmol/L (ref 3.5–5.1)
Sodium: 137 mmol/L (ref 135–145)

## 2023-04-12 NOTE — Patient Instructions (Signed)
 Medication Instructions:  Your physician recommends that you continue on your current medications as directed. Please refer to the Current Medication list given to you today.  *If you need a refill on your cardiac medications before your next appointment, please call your pharmacy*  Lab Work: None ordered If you have labs (blood work) drawn today and your tests are completely normal, you will receive your results only by: MyChart Message (if you have MyChart) OR A paper copy in the mail If you have any lab test that is abnormal or we need to change your treatment, we will call you to review the results.  Follow-Up: At [facility], you and your health needs are our priority.  As part of our continuing mission to provide you with exceptional heart care, we have created designated Provider Care Teams.  These Care Teams include your primary Cardiologist (physician) and Advanced Practice Providers (APPs -  Physician Assistants and Nurse Practitioners) who all work together to provide you with the care you need, when you need it.  Your next appointment:   1 year(s)  Provider:   Canary Brim, NP

## 2023-04-13 ENCOUNTER — Other Ambulatory Visit (HOSPITAL_BASED_OUTPATIENT_CLINIC_OR_DEPARTMENT_OTHER): Payer: Self-pay

## 2023-04-13 MED ORDER — JARDIANCE 10 MG PO TABS
10.0000 mg | ORAL_TABLET | Freq: Every morning | ORAL | 3 refills | Status: AC
  Filled 2023-04-13: qty 30, 30d supply, fill #0

## 2023-04-19 ENCOUNTER — Ambulatory Visit (HOSPITAL_COMMUNITY)
Admission: RE | Admit: 2023-04-19 | Discharge: 2023-04-19 | Disposition: A | Discharge status: Home / Self Care | Payer: Medicare Other | Source: Ambulatory Visit | Attending: Cardiology | Admitting: Cardiology

## 2023-04-19 ENCOUNTER — Telehealth (HOSPITAL_COMMUNITY): Payer: Self-pay

## 2023-04-19 DIAGNOSIS — I77810 Thoracic aortic ectasia: Secondary | ICD-10-CM | POA: Insufficient documentation

## 2023-04-19 DIAGNOSIS — I5042 Chronic combined systolic (congestive) and diastolic (congestive) heart failure: Secondary | ICD-10-CM | POA: Insufficient documentation

## 2023-04-19 LAB — ECHOCARDIOGRAM COMPLETE
AR max vel: 2.6 cm2
AV Area VTI: 2.88 cm2
AV Area mean vel: 2.6 cm2
AV Mean grad: 4 mm[Hg]
AV Peak grad: 7.2 mm[Hg]
Ao pk vel: 1.34 m/s
Area-P 1/2: 2.78 cm2
Calc EF: 54.6 %
MV VTI: 2.42 cm2
S' Lateral: 3.9 cm
Single Plane A2C EF: 54.9 %
Single Plane A4C EF: 55 %

## 2023-04-19 NOTE — Telephone Encounter (Addendum)
Spoke to patient's mother,aware of echocardiogram results  ----- Message from Marca Ancona sent at 04/19/2023  4:12 PM EST ----- EF low normal range, 50-55%.

## 2023-04-27 NOTE — Progress Notes (Incomplete)
Advanced Heart Failure Clinic Note   PCP: [facility] HF Cardiologist: Dr. Shirlee Latch   Reason for Visit:  F/u for Chronic Systolic HF and VT   HPI: Darryl Mitchell is a 48 y.o. with history of NICM dating back to 2016 s/p SQ [employer] ICD, heavy ETOH abuse, DMII, OSA, HTN, and tobacco abuse.  Had been sober x 2 years but relapsed 4/22. He was drinking 2 gallons of vodka a day.  He was admitted to [facility] 6/22 w/ ? seizure activity, VT/VF requiring shocks in the field.  EP consulted and felt VF in the setting of ETOH abuse + hypokalemia/hypomagnesemia. K/Mag replaced. Initially on amio drip but later stopped due to QT prolongation. Required phenobarbital for ETOH withdrawal. Echo showed severely reduced LVEF at 10% and severely reduced RV systolic function. Ischemic CMP felt to be unlikely.  Rhythm stabilized and he had no further VT/VF. He diuresed well w/ IV Lasix, down 20 lb. Transitioned to PO torsemide. GDMT initiated w/ the exception ARNi given h/o angioedema w/ lisinopril. He was started on losartan. D/c wt 218 lb.   Echo 10/22 showed EF up to 50% with moderate LVH and normal RV.   He was lost to followup for 2 years when he returned to [place].  He started drinking again.  He says that cirrhosis was diagnosed there and he had multiple paracenteses.    He moved back to [place] and was seen in the ER for peripheral edema and abdominal distention.  He felt like he needed a paracentesis.  However, abdominal US did not show enough ascites.  He had been off all ETOH for about 5 months. He is on no other cardiac meds, he says that physicians in [place] stopped his other meds.  He was referred back to [place] by the ER for CHF evaluation.    Today he returns for AHF follow up. Overall feeling ***. Denies palpitations, CP, dizziness, edema, or PND/Orthopnea. *** SOB. Appetite ok. No fever or chills. Weight at home *** pounds. Taking all medications. Denies ETOH, tobacco or drug use.     PMH: 1. Cirrhosis: With ascites and h/o paracenteses. Likely due to ETOH abuse.  2. H/o VT: [employer] subcutaneous ICD.  3. Chronic systolic CHF: Suspect ETOH cardiomyopathy, noted since 2016. Reports prior cath (outside facility) with no significant CAD.  EF waxes and wanes with ETOH use then cutting back on ETOH.  - Echo (6/22): EF 10%, severe LV dilation, severely decreased RV function with moderate RVE, mild MR. - Echo (10/22): EF 50%, moderate LVH, normal RV.  4. HTN 5. DM2 6. Smoker 7. CKD stage 3  Review of systems complete and found to be negative unless listed in HPI.    Current Outpatient Medications  Medication Sig Dispense Refill   amLODipine (NORVASC) 2.5 MG tablet Take 2.5 mg by mouth daily.     empagliflozin (JARDIANCE) 10 MG TABS tablet Take 1 tablet (10 mg total) by mouth in the morning with or without food. 30 tablet 3   folic acid (FOLVITE) 1 MG tablet Take 1 tablet (1 mg total) by mouth daily.     lactulose (CHRONULAC) 10 GM/15ML solution Take 30 mLs (20 g total) by mouth 3 (three) times daily.     magnesium oxide (MAG-OX) 400 MG tablet Take 400 mg by mouth as needed.     olmesartan (BENICAR) 20 MG tablet Take 20 mg by mouth daily.     potassium chloride SA (KLOR-CON M) 20  MEQ tablet Take 2 tablets (40 mEq total) by mouth daily. 60 tablet 11   spironolactone (ALDACTONE) 25 MG tablet Take 0.5 tablets (12.5 mg total) by mouth daily. 15 tablet 3   thiamine 100 MG tablet Take 1 tablet (100 mg total) by mouth daily.     torsemide (DEMADEX) 20 MG tablet Take 20 mg by mouth daily.     traZODone (DESYREL) 50 MG tablet Take 50 mg by mouth at bedtime as needed for sleep.     No current facility-administered medications for this visit.   Allergies  Allergen Reactions   Lisinopril Swelling    Social History   Socioeconomic History   Marital status: Divorced    Spouse name: Not on file   Number of children: Not on file   Years of education: Not on file    Highest education level: Not on file  Occupational History   Not on file  Tobacco Use   Smoking status: Every Day    Current packs/day: 3.00    Types: Cigarettes   Smokeless tobacco: Current  Substance and Sexual Activity   Alcohol use: Yes    Comment: 2 gallons of Vodka   Drug use: Not on file   Sexual activity: Not on file  Other Topics Concern   Not on file  Social History Narrative   Not on file   Social Drivers of Health   Financial Resource Strain: Medium Risk (09/07/2020)   Overall Financial Resource Strain (CARDIA)    Difficulty of Paying Living Expenses: Somewhat hard  Food Insecurity: No Food Insecurity (09/07/2020)   Hunger Vital Sign    Worried About Running Out of Food in the Last Year: Never true    Ran Out of Food in the Last Year: Never true  Transportation Needs: Unmet Transportation Needs (09/08/2020)   PRAPARE - Administrator, Civil Service (Medical): Yes    Lack of Transportation (Non-Medical): Yes  Physical Activity: Not on file  Stress: Not on file  Social Connections: Not on file  Intimate Partner Violence: Not on file   No family history on file.  There were no vitals taken for this visit.  Wt Readings from Last 3 Encounters:  04/12/23 90 kg (198 lb 6.4 oz)  03/15/23 91.5 kg (201 lb 12.8 oz)  02/09/21 105.4 kg (232 lb 6.4 oz)   PHYSICAL EXAM: General:  *** appearing.  No respiratory difficulty HEENT: normal Neck: supple. JVD *** cm. Carotids 2+ bilat; no bruits. No lymphadenopathy or thyromegaly appreciated. Cor: PMI nondisplaced. Regular rate & rhythm. No rubs, gallops or murmurs. Lungs: clear Abdomen: soft, nontender, nondistended. No hepatosplenomegaly. No bruits or masses. Good bowel sounds. Extremities: no cyanosis, clubbing, rash, edema  Neuro: alert & oriented x 3, cranial nerves grossly intact. moves all 4 extremities w/o difficulty. Affect pleasant.   ASSESSMENT & PLAN: 1.Chronic systolic CHF: Biventricular failure,  followed in [place] in the past with diagnosis in 2016.  Echoes have shown EF up and down, appears to fluctuate with medication compliance and ETOH intake. Possible ETOH cardiomyopathy though HTN may play a role. Echo at [facility] 6/22 w/ EF 10%, severe LV dilation, severely reduced RV systolic function, mild MR.  This was in setting of heavy ETOH abuse.  Self reports prior cath without CAD, though records are not available to [place] for review.  Has [employer] ICD.  Echo 10/22 EF up to 50% when he had cut back on ETOH.  Lost to followup for 2  years, recently returned with peripheral edema.  He quit drinking again about 5 months ago. Echo 1/25: EF 50-55% - NYHA class I-II, no JVD.  - He has been on torsemide 20 mg daily long-term, will continue.   - Start spironolactone 12.5 mg daily with history of CHF and also cirrhosis with ascites. BMET/BNP today, BMET in 10 days.  *** - No Entresto or ACEI given h/o angioedema w/ lisinopril . - Imperative to stay off ETOH.  EF tracks inversely with ETOH use.  2. VT/VF: admit 6/22 w/ VT/VF w/ multiple ICD shocks, in setting of ETOH intoxication, hypokalemia and hypomagnesia. Initially placed on amiodarone but discontinued due to QT prolongation.  He has a Environmental manager SQ ICD and denies any further shocks.  - Needs device clinic followup, will arrange. *** 3.  H/o ETOH Abuse:  He says that he has quit.   - Reiterated the importance of abstinence given cardiac issues and cirrhosis. 4. HTN: Elevated BP today.  - Starting spironolactone.   5. HLD: On Crestor in the past.  - LDL 85 12/24 6. Smoking: I urged him to quit.  7. Cirrhosis: Likely due to ETOH.  History of paracenteses. He is on lactulose.  He says he has an appointment with [facility].   Followup 6 wks with APP. ***  Alen Bleacher, NP 04/27/23

## 2023-05-03 ENCOUNTER — Telehealth (HOSPITAL_COMMUNITY): Payer: Self-pay

## 2023-05-03 NOTE — Telephone Encounter (Signed)
 Called to confirm/remind patient of their appointment at the Advanced Heart Failure Clinic on 05/04/23. However, pt's mom said pt is sick and will call to reschedule.

## 2023-05-04 ENCOUNTER — Encounter (HOSPITAL_COMMUNITY): Payer: Medicare Other

## 2023-05-25 ENCOUNTER — Emergency Department (HOSPITAL_COMMUNITY)
Admission: EM | Admit: 2023-05-25 | Discharge: 2023-05-25 | Disposition: A | Discharge status: Home / Self Care | Payer: Medicare Other | Attending: Emergency Medicine | Admitting: Emergency Medicine

## 2023-05-25 ENCOUNTER — Other Ambulatory Visit: Payer: Self-pay

## 2023-05-25 ENCOUNTER — Encounter (HOSPITAL_COMMUNITY): Payer: Self-pay | Admitting: *Deleted

## 2023-05-25 DIAGNOSIS — R040 Epistaxis: Secondary | ICD-10-CM | POA: Diagnosis present

## 2023-05-25 LAB — BASIC METABOLIC PANEL
Anion gap: 10 (ref 5–15)
BUN: 13 mg/dL (ref 6–20)
CO2: 21 mmol/L — ABNORMAL LOW (ref 22–32)
Calcium: 8.6 mg/dL — ABNORMAL LOW (ref 8.9–10.3)
Chloride: 108 mmol/L (ref 98–111)
Creatinine, Ser: 0.9 mg/dL (ref 0.61–1.24)
GFR, Estimated: >60 mL/min (ref >60)
Glucose, Bld: 185 mg/dL — ABNORMAL HIGH (ref 70–99)
Potassium: 4.3 mmol/L (ref 3.5–5.1)
Sodium: 139 mmol/L (ref 135–145)

## 2023-05-25 LAB — PROTIME-INR
INR: 1.2 (ref 0.8–1.2)
Prothrombin Time: 15 s (ref 11.4–15.2)

## 2023-05-25 LAB — CBC
HCT: 38.7 % — ABNORMAL LOW (ref 39.0–52.0)
Hemoglobin: 12.4 g/dL — ABNORMAL LOW (ref 13.0–17.0)
MCH: 27.5 pg (ref 26.0–34.0)
MCHC: 32 g/dL (ref 30.0–36.0)
MCV: 85.8 fL (ref 80.0–100.0)
Platelets: 200 10*3/uL (ref 150–400)
RBC: 4.51 MIL/uL (ref 4.22–5.81)
RDW: 15.1 % (ref 11.5–15.5)
WBC: 6.6 10*3/uL (ref 4.0–10.5)
nRBC: 0 % (ref 0.0–0.2)

## 2023-05-25 LAB — TYPE AND SCREEN
ABO/RH(D): O POS
Antibody Screen: NEGATIVE

## 2023-05-25 MED ORDER — ONDANSETRON HCL 4 MG/2ML IJ SOLN
4.0000 mg | Freq: Once | INTRAMUSCULAR | Status: AC
  Administered 2023-05-25: 4 mg via INTRAVENOUS
  Filled 2023-05-25: qty 2

## 2023-05-25 MED ORDER — HYDROMORPHONE HCL 1 MG/ML IJ SOLN
0.5000 mg | Freq: Once | INTRAMUSCULAR | Status: AC
  Administered 2023-05-25: 0.5 mg via INTRAVENOUS
  Filled 2023-05-25: qty 1

## 2023-05-25 MED ORDER — HYDROCODONE-ACETAMINOPHEN 5-325 MG PO TABS: 1.0000 | ORAL_TABLET | Freq: Four times a day (QID) | ORAL | 0 refills | Status: AC | PRN

## 2023-05-25 MED ORDER — TRANEXAMIC ACID-NACL 1000-0.7 MG/100ML-% IV SOLN
1000.0000 mg | INTRAVENOUS | Status: AC
  Administered 2023-05-25: 1000 mg via INTRAVENOUS
  Filled 2023-05-25: qty 100

## 2023-05-25 MED ORDER — LIDOCAINE-EPINEPHRINE (PF) 2 %-1:200000 IJ SOLN
10.0000 mL | Freq: Once | INTRAMUSCULAR | Status: AC
  Administered 2023-05-25: 10 mL
  Filled 2023-05-25: qty 20

## 2023-05-25 MED ORDER — SODIUM CHLORIDE 0.9 % IV BOLUS
1000.0000 mL | Freq: Once | INTRAVENOUS | Status: AC
  Administered 2023-05-25: 1000 mL via INTRAVENOUS

## 2023-05-25 NOTE — Discharge Instructions (Addendum)
 Please call the number above for [facility] to request a follow-up appointment on Monday with one of their specialist to remove the packing in your nose.  Let them know you were seen in the emergency department for this issue, and Dr. Jenne Pane was made aware of this case.   The packing should be removed in 4-5 days maximum, ideally in the office setting with the specialist.  Avoid any excessive exertion or stressors that may raise your blood pressure or put pressure on your head or neck.  If your bleeding returns or worsens, please return to the hospital.

## 2023-05-25 NOTE — ED Provider Notes (Signed)
 [place] EMERGENCY DEPARTMENT AT [facility] Provider Note   CSN: [id number] Arrival date & time: 05/25/23  1236     History  Chief Complaint  Patient presents with   Epistaxis    Darryl Mitchell is a 48 y.o. male presented to ED with nosebleed.  Patient ports he had a small amount of bleeding from his bilateral nares last night and then woke up this morning spontaneously began bleeding.  He says he was running out of both nose.  Denies finger picking or trauma to the nose.  Denies blood thinner use or prior history of nosebleeds.  He says he was swallowing a lot of blood.  And EMS was noted have significant bleeding and had bilateral Rhino Rocket's placed with some improvement of his bleeding, but continues to have oozing around it.  HPI     Home Medications Prior to Admission medications   Medication Sig Start Date End Date Taking? Authorizing Provider  HYDROcodone-acetaminophen (NORCO/VICODIN) 5-325 MG tablet Take 1 tablet by mouth every 6 (six) hours as needed for up to 12 doses for severe pain (pain score 7-10). 05/25/23  Yes Rosann Gorum, Kermit Balo, MD  amLODipine (NORVASC) 2.5 MG tablet Take 2.5 mg by mouth daily. 11/26/22   [provider]  empagliflozin (JARDIANCE) 10 MG TABS tablet Take 1 tablet (10 mg total) by mouth in the morning with or without food. 04/13/23     folic acid (FOLVITE) 1 MG tablet Take 1 tablet (1 mg total) by mouth daily. 09/10/20   Pennie Banter, DO  lactulose (CHRONULAC) 10 GM/15ML solution Take 30 mLs (20 g total) by mouth 3 (three) times daily. 03/15/23   Laurey Morale, MD  magnesium oxide (MAG-OX) 400 MG tablet Take 400 mg by mouth as needed.    [provider]  olmesartan (BENICAR) 20 MG tablet Take 20 mg by mouth daily. 03/03/23   [provider]  potassium chloride SA (KLOR-CON M) 20 MEQ tablet Take 2 tablets (40 mEq total) by mouth daily. 02/25/21   Laurey Morale, MD  spironolactone (ALDACTONE) 25 MG tablet Take 0.5  tablets (12.5 mg total) by mouth daily. 03/15/23   Laurey Morale, MD  thiamine 100 MG tablet Take 1 tablet (100 mg total) by mouth daily. 09/10/20   Pennie Banter, DO  torsemide (DEMADEX) 20 MG tablet Take 20 mg by mouth daily.    [provider]  traZODone (DESYREL) 50 MG tablet Take 50 mg by mouth at bedtime as needed for sleep.    [provider]      Allergies    Lisinopril    Review of Systems   Review of Systems  Physical Exam Updated Vital Signs BP 137/89   Pulse 68   Temp (!) 97.5 F (36.4 C)   Resp 12   SpO2 100%  Physical Exam Constitutional:      General: He is not in acute distress. HENT:     Head: Normocephalic and atraumatic.  Eyes:     Conjunctiva/sclera: Conjunctivae normal.     Pupils: Pupils are equal, round, and reactive to light.  Cardiovascular:     Rate and Rhythm: Normal rate and regular rhythm.  Pulmonary:     Effort: Pulmonary effort is normal. No respiratory distress.  Abdominal:     General: There is no distension.     Tenderness: There is no abdominal tenderness.  Skin:    General: Skin is warm and dry.  Neurological:  General: No focal deficit present.     Mental Status: He is alert and oriented to person, place, and time. Mental status is at baseline.  Psychiatric:        Mood and Affect: Mood normal.        Behavior: Behavior normal.     ED Results / Procedures / Treatments   Labs (all labs ordered are listed, but only abnormal results are displayed) Labs Reviewed  CBC - Abnormal; Notable for the following components:      Result Value   Hemoglobin 12.4 (*)    HCT 38.7 (*)    All other components within normal limits  BASIC METABOLIC PANEL - Abnormal; Notable for the following components:   CO2 21 (*)    Glucose, Bld 185 (*)    Calcium 8.6 (*)    All other components within normal limits  PROTIME-INR  TYPE AND SCREEN    EKG EKG Interpretation Date/Time:  Thursday May 25 2023 13:57:17  EST Ventricular Rate:  89 PR Interval:  188 QRS Duration:  92 QT Interval:  367 QTC Calculation: 447 R Axis:   -20  Text Interpretation: Sinus rhythm Inferior infarct, age indeterminate Confirmed by Alvester Chou [phone]) on 05/25/2023 2:07:54 PM  Radiology No results found.  Procedures Procedures    Medications Ordered in ED Medications  lidocaine-EPINEPHrine (XYLOCAINE W/EPI) 2 %-1:200000 (PF) injection 10 mL (10 mLs Infiltration Given by Other 05/25/23 1311)  HYDROmorphone (DILAUDID) injection 0.5 mg (0.5 mg Intravenous Given 05/25/23 1351)  ondansetron (ZOFRAN) injection 4 mg (4 mg Intravenous Given 05/25/23 1351)  tranexamic acid (CYKLOKAPRON) IVPB 1,000 mg (1,000 mg Intravenous New Bag/Given 05/25/23 1407)  sodium chloride 0.9 % bolus 1,000 mL (1,000 mLs Intravenous New Bag/Given 05/25/23 1441)    ED Course/ Medical Decision Making/ A&P Clinical Course as of 05/25/23 1545  Thu May 25, 2023  1348 Patient does report some nausea and feeling dizzy [MT]  1348 Bleeding controlled at this time [MT]  1440 Dr Jenne Pane - ENT - recommends observation, if persistent bleeding can admit to hospital, otherwise can follow up in ENT office in 4 days for packing removal [MT]  1527 Pt no longer bleeding - signed out to Dr Posey Rea EDP with plan to observe until 5 pm - if no further bleeding can be discharged with ENT folllow upon Monday for packing removal.  No antibiotics ppx recommended by Dr Jenne Pane. [MT]    Clinical Course User Index [MT] Kasean Denherder, Kermit Balo, MD                                 Medical Decision Making Amount and/or Complexity of Data Reviewed Labs: ordered. ECG/medicine tests: ordered.  Risk Prescription drug management.   Patient is here with atraumatic epistaxis.  Had bilateral Rhino Rocket's placed in triage due to concern for hemorrhage.  Heart rate and blood pressure have improved since bleeding control, patient was also given a bolus of fluid here as well as a ordered  IV TXA as he was continued to have some oozing around the right Rhino Rocket site.  On my subsequent reassessment the bleeding had stopped.  The case was discussed with ENT, see ED course, and we have opted to observe the patient in the ED for period of approximately 4 hours, ensuring that the bleeding is stopped prior to discharge home.  If the bleeding resumes he would likely require medical admission at that point.  No prophylactic antibiotics indicated according to ENT doctor.  Patient was given a small dose of IV Dilaudid for pain and oral pain medicine prescribed for home due to discomfort from bilateral nares packing.  He understands that if he goes home he will need to contact ENT office to have the packing removed within 4 to 5 days.  Signed out to Dr Glendora Score EDP at 3:40 pm pending final reassessment around 5 PM to ensure bleeding has remained hemostatic        Final Clinical Impression(s) / ED Diagnoses Final diagnoses:  Epistaxis    Rx / DC Orders ED Discharge Orders          Ordered    HYDROcodone-acetaminophen (NORCO/VICODIN) 5-325 MG tablet  Every 6 hours PRN        05/25/23 1529              Terald Sleeper, MD 05/25/23 1545

## 2023-05-25 NOTE — ED Triage Notes (Signed)
 Pt is here for nosebleed which began spontaneously at 11am, not associated with any trauma, no blood thinners. Pt is pinching his nose and has a trashcan.  Blood is running out of his nose in triage and pt states that he can feel it running down the back of his throat as well.

## 2023-05-25 NOTE — ED Provider Triage Note (Signed)
 Emergency Medicine Provider Triage Evaluation Note  Jigar Zielke , a 48 y.o. male  was evaluated in triage.  Pt complains of nosebleed.  Patient reports abrupt onset nosebleed an hour prior to arrival.  Denies any blood thinner use or trauma to nose.  States that he has been able to get nose to stop..  Review of Systems  Positive: See above Negative:   Physical Exam  BP (!) 154/108   Pulse 100   Temp (!) 97.5 F (36.4 C)   Resp 17   SpO2 100%  Gen:   Awake, no distress   Resp:  Normal effort MSK:   Moves extremities without difficulty  Other:  Patient bleeding from bilateral nare as well as spitting up blood.  Has trash can in front of him that has significant amount of blood in bottom.  Bleeding at a steady rate.  Bilateral Rhino Rocket's placed with improvement of bleeding.    Medical Decision Making  Medically screening exam initiated at 1:31 PM.  Appropriate orders placed.  [name] was informed that the remainder of the evaluation will be completed by another provider, this initial triage assessment does not replace that evaluation, and the importance of remaining in the ED until their evaluation is complete.  Room requested given amount of bleeding present.   Peter Garter, [place] 05/25/23 1332

## 2023-05-25 NOTE — ED Provider Notes (Signed)
  [place] EMERGENCY DEPARTMENT AT [facility] Provider Note   CSN: [id number] Arrival date & time: 05/25/23  1236     History  Chief Complaint  Patient presents with   Epistaxis    Darryl Mitchell is a 48 y.o. male.  Procedures Epistaxis Management  Date/Time: 05/25/2023 4:44 PM  Performed by: Peter Garter, PA Authorized by: Peter Garter, PA   Consent:    Consent obtained:  Verbal   Consent given by:  Patient   Risks, benefits, and alternatives were discussed: yes     Risks discussed:  Bleeding, infection, nasal injury and pain   Alternatives discussed:  No treatment, delayed treatment and alternative treatment Universal protocol:    Procedure explained and questions answered to patient or proxy's satisfaction: yes     Patient identity confirmed:  Verbally with patient and arm band Anesthesia:    Anesthesia method:  Topical application   Topical anesthesia: Lidocaine with epinephrine. Procedure details:    Treatment site:  L anterior and L posterior   Treatment method:  Nasal balloon   Treatment complexity:  Extensive   Treatment episode: recurring   Post-procedure details:    Assessment:  Bleeding stopped   Procedure completion:  Tolerated well, no immediate complications Epistaxis Management  Date/Time: 05/25/2023 4:44 PM  Performed by: Peter Garter, PA Authorized by: Peter Garter, PA   Consent:    Consent obtained:  Verbal   Consent given by:  Patient and parent   Risks, benefits, and alternatives were discussed: yes     Risks discussed:  Bleeding, infection, nasal injury and pain   Alternatives discussed:  No treatment and delayed treatment Universal protocol:    Procedure explained and questions answered to patient or proxy's satisfaction: yes     Patient identity confirmed:  Verbally with patient Anesthesia:    Anesthesia method:  Topical application   Topical anesthesia: Lidocaine with epinephrine. Procedure details:     Treatment site:  R anterior and R posterior   Treatment method:  Nasal balloon   Treatment complexity:  Limited   Treatment episode: recurring   Post-procedure details:    Assessment:  Bleeding decreased   Procedure completion:  Tolerated well, no immediate complications      Peter Garter, PA 05/25/23 1645    Pricilla Loveless, MD 05/26/23 [phone]

## 2023-05-25 NOTE — ED Notes (Signed)
 Taped tails of rhino rockets to pts cheek. Extra tape provided for home as well.

## 2023-05-25 NOTE — ED Notes (Signed)
 Rhino rocket placed bilaterally by PA in triage.  Pt continues to have bleeding

## 2023-05-27 ENCOUNTER — Encounter (HOSPITAL_COMMUNITY): Payer: Self-pay

## 2023-05-27 ENCOUNTER — Other Ambulatory Visit: Payer: Self-pay

## 2023-05-27 ENCOUNTER — Telehealth: Payer: Self-pay | Admitting: Home Health

## 2023-05-27 ENCOUNTER — Emergency Department (HOSPITAL_COMMUNITY)

## 2023-05-27 ENCOUNTER — Emergency Department (HOSPITAL_COMMUNITY)
Admission: EM | Admit: 2023-05-27 | Discharge: 2023-05-27 | Disposition: A | Discharge status: Home / Self Care | Attending: Emergency Medicine | Admitting: Emergency Medicine

## 2023-05-27 DIAGNOSIS — Z79899 Other long term (current) drug therapy: Secondary | ICD-10-CM | POA: Insufficient documentation

## 2023-05-27 DIAGNOSIS — R519 Headache, unspecified: Secondary | ICD-10-CM | POA: Diagnosis present

## 2023-05-27 DIAGNOSIS — D72829 Elevated white blood cell count, unspecified: Secondary | ICD-10-CM | POA: Diagnosis not present

## 2023-05-27 DIAGNOSIS — J0140 Acute pansinusitis, unspecified: Secondary | ICD-10-CM | POA: Insufficient documentation

## 2023-05-27 DIAGNOSIS — I509 Heart failure, unspecified: Secondary | ICD-10-CM | POA: Insufficient documentation

## 2023-05-27 LAB — URINALYSIS, ROUTINE W REFLEX MICROSCOPIC
Bacteria, UA: NONE SEEN
Bilirubin Urine: NEGATIVE
Glucose, UA: NEGATIVE mg/dL
Ketones, ur: NEGATIVE mg/dL
Leukocytes,Ua: NEGATIVE
Nitrite: NEGATIVE
Protein, ur: 100 mg/dL — AB
Specific Gravity, Urine: 1.031 — ABNORMAL HIGH (ref 1.005–1.030)
pH: 6 (ref 5.0–8.0)

## 2023-05-27 LAB — BASIC METABOLIC PANEL
Anion gap: 9 (ref 5–15)
BUN: 12 mg/dL (ref 6–20)
CO2: 23 mmol/L (ref 22–32)
Calcium: 8.5 mg/dL — ABNORMAL LOW (ref 8.9–10.3)
Chloride: 102 mmol/L (ref 98–111)
Creatinine, Ser: 0.9 mg/dL (ref 0.61–1.24)
GFR, Estimated: >60 mL/min (ref >60)
Glucose, Bld: 164 mg/dL — ABNORMAL HIGH (ref 70–99)
Potassium: 4.1 mmol/L (ref 3.5–5.1)
Sodium: 134 mmol/L — ABNORMAL LOW (ref 135–145)

## 2023-05-27 LAB — CBC
HCT: 36.6 % — ABNORMAL LOW (ref 39.0–52.0)
Hemoglobin: 12.2 g/dL — ABNORMAL LOW (ref 13.0–17.0)
MCH: 28 pg (ref 26.0–34.0)
MCHC: 33.3 g/dL (ref 30.0–36.0)
MCV: 84.1 fL (ref 80.0–100.0)
Platelets: 206 10*3/uL (ref 150–400)
RBC: 4.35 MIL/uL (ref 4.22–5.81)
RDW: 14.6 % (ref 11.5–15.5)
WBC: 16.4 10*3/uL — ABNORMAL HIGH (ref 4.0–10.5)
nRBC: 0 % (ref 0.0–0.2)

## 2023-05-27 LAB — CBG MONITORING, ED: Glucose-Capillary: 157 mg/dL — ABNORMAL HIGH (ref 70–99)

## 2023-05-27 MED ORDER — SODIUM CHLORIDE 0.9 % IV BOLUS
500.0000 mL | Freq: Once | INTRAVENOUS | Status: AC
  Administered 2023-05-27: 500 mL via INTRAVENOUS

## 2023-05-27 MED ORDER — AMOXICILLIN-POT CLAVULANATE 875-125 MG PO TABS: 1.0000 | ORAL_TABLET | Freq: Two times a day (BID) | ORAL | 0 refills | Status: AC

## 2023-05-27 MED ORDER — MORPHINE SULFATE (PF) 4 MG/ML IV SOLN
4.0000 mg | Freq: Once | INTRAVENOUS | Status: AC
  Administered 2023-05-27: 4 mg via INTRAVENOUS
  Filled 2023-05-27: qty 1

## 2023-05-27 MED ORDER — OXYCODONE-ACETAMINOPHEN 7.5-325 MG PO TABS: 1.0000 | ORAL_TABLET | ORAL | 0 refills | Status: AC | PRN

## 2023-05-27 MED ORDER — IOHEXOL 350 MG/ML SOLN
75.0000 mL | Freq: Once | INTRAVENOUS | Status: AC | PRN
  Administered 2023-05-27: 75 mL via INTRAVENOUS

## 2023-05-27 MED ORDER — SODIUM CHLORIDE 0.9 % IV SOLN
3.0000 g | Freq: Once | INTRAVENOUS | Status: AC
  Administered 2023-05-27: 3 g via INTRAVENOUS
  Filled 2023-05-27: qty 8

## 2023-05-27 NOTE — Discharge Instructions (Addendum)
 I spoke with the ears nose and throat, Dr., They recommended continuing the nasal tamponade's, as instructed.  I took some pressure off them do, help with the pain.  We are going to start you on antibiotics, bleed because I believe that you have an infection.  I have prescribed you some Augmentin, please take this as prescribed.  Have also prescribed you some nausea medicine, further pain medication, to help cope with your pain.  Return to the ER if you have worsening swelling of your face, difficulty breathing, or intractable nausea, vomiting

## 2023-05-27 NOTE — ED Provider Notes (Signed)
 [place] EMERGENCY DEPARTMENT AT [facility] Provider Note   CSN: [id number] Arrival date & time: 05/27/23  1332     History  Chief Complaint  Patient presents with   Headache    Darryl Mitchell is a 48 y.o. male, history of CHF, alcohol abuse, VT, liver cirrhosis, who presents to the ED secondary to facial swelling, this been going on for the past day.  He states few days ago, he had intractable nosebleeds, so he had placement of 2 posterior packs.  Since then, for the past day, he has had some swelling of his face, intractable headache.  Has had reduced appetite, and blood pressure has been elevated.  Was told to come to the ER secondary to request, from his cardiologist, given elevated blood pressure, and headache.  He denies any chest pain, shortness of breath, fevers or chills.  Packing has been in place and he has not had any breakthrough bleeding.  Home Medications Prior to Admission medications   Medication Sig Start Date End Date Taking? Authorizing Provider  amoxicillin-clavulanate (AUGMENTIN) 875-125 MG tablet Take 1 tablet by mouth every 12 (twelve) hours. 05/27/23  Yes Lona Six L, PA  oxyCODONE-acetaminophen (PERCOCET) 7.5-325 MG tablet Take 1 tablet by mouth every 4 (four) hours as needed for severe pain (pain score 7-10). 05/27/23  Yes Maryana Pittmon L, PA  amLODipine (NORVASC) 2.5 MG tablet Take 2.5 mg by mouth daily. 11/26/22   [provider]  empagliflozin (JARDIANCE) 10 MG TABS tablet Take 1 tablet (10 mg total) by mouth in the morning with or without food. 04/13/23     folic acid (FOLVITE) 1 MG tablet Take 1 tablet (1 mg total) by mouth daily. 09/10/20   Pennie Banter, DO  HYDROcodone-acetaminophen (NORCO/VICODIN) 5-325 MG tablet Take 1 tablet by mouth every 6 (six) hours as needed for up to 12 doses for severe pain (pain score 7-10). 05/25/23   Terald Sleeper, MD  lactulose (CHRONULAC) 10 GM/15ML solution Take 30 mLs (20 g total) by mouth 3 (three)  times daily. 03/15/23   Laurey Morale, MD  magnesium oxide (MAG-OX) 400 MG tablet Take 400 mg by mouth as needed.    [provider]  olmesartan (BENICAR) 20 MG tablet Take 20 mg by mouth daily. 03/03/23   [provider]  potassium chloride SA (KLOR-CON M) 20 MEQ tablet Take 2 tablets (40 mEq total) by mouth daily. 02/25/21   Laurey Morale, MD  spironolactone (ALDACTONE) 25 MG tablet Take 0.5 tablets (12.5 mg total) by mouth daily. 03/15/23   Laurey Morale, MD  thiamine 100 MG tablet Take 1 tablet (100 mg total) by mouth daily. 09/10/20   Pennie Banter, DO  torsemide (DEMADEX) 20 MG tablet Take 20 mg by mouth daily.    [provider]  traZODone (DESYREL) 50 MG tablet Take 50 mg by mouth at bedtime as needed for sleep.    [provider]      Allergies    Lisinopril    Review of Systems   Review of Systems  Constitutional:  Negative for fever.  HENT:  Positive for nosebleeds.   Neurological:  Positive for headaches.    Physical Exam Updated Vital Signs BP (!) 163/101 (BP Location: Right Arm)   Pulse (!) 110   Temp 98.6 F (37 C) (Oral)   Resp 18   Ht 6' (1.829 m)   Wt 89.8 kg   SpO2 97%   BMI 26.85 kg/m  Physical Exam Vitals and nursing note reviewed.  Constitutional:      General: He is not in acute distress.    Appearance: He is well-developed.  HENT:     Head: Normocephalic and atraumatic.  Eyes:     Conjunctiva/sclera: Conjunctivae normal.     Comments: Edema of the nose, as well as paranasal sinuses see picture for further detail.  Rhino rockets in place with crusting of bilateral naris  Cardiovascular:     Rate and Rhythm: Normal rate and regular rhythm.     Heart sounds: No murmur heard. Pulmonary:     Effort: Pulmonary effort is normal. No respiratory distress.     Breath sounds: Normal breath sounds.  Abdominal:     Palpations: Abdomen is soft.     Tenderness: There is no abdominal tenderness.  Musculoskeletal:         General: No swelling.     Cervical back: Neck supple.  Skin:    General: Skin is warm and dry.     Capillary Refill: Capillary refill takes less than 2 seconds.  Neurological:     Mental Status: He is alert.  Psychiatric:        Mood and Affect: Mood normal.     ED Results / Procedures / Treatments   Labs (all labs ordered are listed, but only abnormal results are displayed) Labs Reviewed  BASIC METABOLIC PANEL - Abnormal; Notable for the following components:      Result Value   Sodium 134 (*)    Glucose, Bld 164 (*)    Calcium 8.5 (*)    All other components within normal limits  CBC - Abnormal; Notable for the following components:   WBC 16.4 (*)    Hemoglobin 12.2 (*)    HCT 36.6 (*)    All other components within normal limits  URINALYSIS, ROUTINE W REFLEX MICROSCOPIC - Abnormal; Notable for the following components:   Specific Gravity, Urine 1.031 (*)    Hgb urine dipstick MODERATE (*)    Protein, ur 100 (*)    All other components within normal limits  CBG MONITORING, ED - Abnormal; Notable for the following components:   Glucose-Capillary 157 (*)    All other components within normal limits    EKG EKG Interpretation Date/Time:  Saturday May 27 2023 17:53:01 EST Ventricular Rate:  94 PR Interval:  183 QRS Duration:  97 QT Interval:  365 QTC Calculation: 457 R Axis:   -5  Text Interpretation: Sinus rhythm Abnormal inferior Q waves Anterior infarct, old No significant change since last tracing Confirmed by Gwyneth Sprout ([id number]) on 05/27/2023 6:58:28 PM  Radiology CT Maxillofacial W Contrast Result Date: 05/27/2023 CLINICAL DATA:  Initial evaluation for acute epistaxis, facial swelling. EXAM: CT MAXILLOFACIAL WITH CONTRAST TECHNIQUE: Multidetector CT imaging of the maxillofacial structures was performed with intravenous contrast. Multiplanar CT image reconstructions were also generated. RADIATION DOSE REDUCTION: This exam was performed according to  the departmental dose-optimization program which includes automated exposure control, adjustment of the mA and/or kV according to patient size and/or use of iterative reconstruction technique. CONTRAST:  75mL OMNIPAQUE IOHEXOL 350 MG/ML SOLN COMPARISON:  None Available. FINDINGS: Osseous: No acute osseous abnormality. No discrete or worrisome osseous lesions. Degenerative osteophytic endplate spurring noted within the visualized upper cervical spine. Orbits: Globes and orbital soft tissues within normal limits. Sinuses: A extensive mucosal thickening present throughout the fronto ethmoidal, sphenoid, and maxillary sinuses. Changes are largely chronic in appearance by CT. No visible discrete mass  or other structural abnormality. Soft tissues: Visualized facial soft tissues demonstrate no significant swelling or other inflammatory process. Salivary glands within normal limits. No significant adenopathy within the visualized upper neck. No visible acute abnormality about the dentition. Limited intracranial: Unremarkable. IMPRESSION: 1. Extensive pan sinusitis, largely chronic in appearance by CT, although correlation with physical exam for possible acute on chronic exacerbation is recommended. 2. No other acute abnormality within the face. No discrete mass, swelling, or other inflammatory process. Electronically Signed   By: Rise Mu M.D.   On: 05/27/2023 18:52    Procedures Procedures    Medications Ordered in ED Medications  Ampicillin-Sulbactam (UNASYN) 3 g in sodium chloride 0.9 % 100 mL IVPB (3 g Intravenous New Bag/Given 05/27/23 1926)  sodium chloride 0.9 % bolus 500 mL (0 mLs Intravenous Stopped 05/27/23 1648)  morphine (PF) 4 MG/ML injection 4 mg (4 mg Intravenous Given 05/27/23 1541)  iohexol (OMNIPAQUE) 350 MG/ML injection 75 mL (75 mLs Intravenous Contrast Given 05/27/23 1741)  morphine (PF) 4 MG/ML injection 4 mg (4 mg Intravenous Given 05/27/23 1923)    ED Course/ Medical Decision  Making/ A&P                                 Medical Decision Making Patient is a 48 year old male, here for facial swelling, headache, has been going on for the last 2 days, after having his nose packed, after 2 nosebleeds.  I read the notes, and he does not show any antibiotics.  He has diffuse swelling of his nose, and discomfort, we will obtain a CT max face, with contrast, to further evaluate.  Morphine, and fluids ordered  Amount and/or Complexity of Data Reviewed Labs: ordered.    Details: Leukocytosis of 16.4 K, Radiology: ordered.    Details: CT sinus, show diffuse pansinusitis Discussion of management or test interpretation with external provider(s): I spoke with [facility], on-call, Dr. Sharol Harness, he advises starting the patient on Augmentin, and having him follow-up in clinic, on Monday, states that the patient cannot get in clinic on Monday, to have them call, and let them know, he was in the ER, and he will take a note of this.  And they will squeeze him in, this pain is so bad.  His pain is slightly relieved, after reducing some of the pressure, on the nasal tamponade's, however he still having it.  ENT states not to remove, given excessive bleeding, initially, and has only had it placed for 2 days.  I recommended ice, to help with the swelling to his sister.  Also I sent him some Percocets for the pain, and when he was discharged with strict return precautions.  No neurodeficits on exam  Risk Prescription drug management.    Final Clinical Impression(s) / ED Diagnoses Final diagnoses:  Acute non-recurrent pansinusitis    Rx / DC Orders ED Discharge Orders          Ordered    oxyCODONE-acetaminophen (PERCOCET) 7.5-325 MG tablet  Every 4 hours PRN        05/27/23 1941    amoxicillin-clavulanate (AUGMENTIN) 875-125 MG tablet  Every 12 hours        05/27/23 1941              Kingstyn Deruiter, [place]ley [place], [place] 05/27/23 2031    Gwyneth Sprout, MD 05/28/23 1348

## 2023-05-27 NOTE — ED Triage Notes (Signed)
 Pt c/o headache and hypertension that started 2 days ago. Pt was seen on 2/27 for nosebleed, has epistaxis management device in place. Pt having swelling in nose and face that started yesterday. Pt's BP at home was 174/110-150/94. Pt c/o pain behind eyes and in upper mouth.

## 2023-05-27 NOTE — Telephone Encounter (Signed)
 Patient's sister Joni Reining called, states patient was at ER Thursday, received nasal packing for massive epistaxis. Nose bleeding had stopped. He had persistent headache since Thursday, BP has been elevated up to 170s systolic. He is having worse headache today. Advised the patient go to the ER for in person evaluation given worsening headache and uncontrolled HTN. Phone assessment is limited. Sister agreed.

## 2023-06-06 NOTE — Progress Notes (Unsigned)
 [facility]  PATIENT: Darryl Mitchell DOB: 1975-05-13  REFERRING DOCTOR OR PCP:   Deberah Pelton, MD; [facility] SOURCE: Patient, notes from primary care, imaging and lab reports, MRI images personally reviewed.  _________________________________   HISTORICAL  CHIEF COMPLAINT:  No chief complaint on file.   HISTORY OF PRESENT ILLNESS:  ***   Imaging: MRI of the brain 04/04/2023 showed single incompletely T2/FLAIR hyperintense foci predominantly in the subcortical and deep white matter.  Confluencies are noted in the periatrial white matter.  None of the foci appear to be acute.  Inflammatory changes are noted in the left frontal sinus, maxillary sinuses and some of the ethmoid air cells  MRI of the orbits 04/04/2023 showed normal optic nerves and orbits.  Incidental note of ethmoid and left frontal sinusitis.  REVIEW OF SYSTEMS: Constitutional: No fevers, chills, sweats, or change in appetite Eyes: No visual changes, double vision, eye pain Ear, nose and throat: No hearing loss, ear pain, nasal congestion, sore throat Cardiovascular: No chest pain, palpitations Respiratory:  No shortness of breath at rest or with exertion.   No wheezes GastrointestinaI: No nausea, vomiting, diarrhea, abdominal pain, fecal incontinence Genitourinary:  No dysuria, urinary retention or frequency.  No nocturia. Musculoskeletal:  No neck pain, back pain Integumentary: No rash, pruritus, skin lesions Neurological: as above Psychiatric: No depression at this time.  No anxiety Endocrine: No palpitations, diaphoresis, change in appetite, change in weigh or increased thirst Hematologic/Lymphatic:  No anemia, purpura, petechiae. Allergic/Immunologic: No itchy/runny eyes, nasal congestion, recent allergic reactions, rashes  ALLERGIES: Allergies  Allergen Reactions   Lisinopril Swelling    HOME MEDICATIONS:  Current Outpatient Medications:    amLODipine (NORVASC)  2.5 MG tablet, Take 2.5 mg by mouth daily., Disp: , Rfl:    amoxicillin-clavulanate (AUGMENTIN) 875-125 MG tablet, Take 1 tablet by mouth every 12 (twelve) hours., Disp: 14 tablet, Rfl: 0   empagliflozin (JARDIANCE) 10 MG TABS tablet, Take 1 tablet (10 mg total) by mouth in the morning with or without food., Disp: 30 tablet, Rfl: 3   folic acid (FOLVITE) 1 MG tablet, Take 1 tablet (1 mg total) by mouth daily., Disp: , Rfl:    HYDROcodone-acetaminophen (NORCO/VICODIN) 5-325 MG tablet, Take 1 tablet by mouth every 6 (six) hours as needed for up to 12 doses for severe pain (pain score 7-10)., Disp: 12 tablet, Rfl: 0   lactulose (CHRONULAC) 10 GM/15ML solution, Take 30 mLs (20 g total) by mouth 3 (three) times daily., Disp: , Rfl:    magnesium oxide (MAG-OX) 400 MG tablet, Take 400 mg by mouth as needed., Disp: , Rfl:    olmesartan (BENICAR) 20 MG tablet, Take 20 mg by mouth daily., Disp: , Rfl:    oxyCODONE-acetaminophen (PERCOCET) 7.5-325 MG tablet, Take 1 tablet by mouth every 4 (four) hours as needed for severe pain (pain score 7-10)., Disp: 8 tablet, Rfl: 0   potassium chloride SA (KLOR-CON M) 20 MEQ tablet, Take 2 tablets (40 mEq total) by mouth daily., Disp: 60 tablet, Rfl: 11   spironolactone (ALDACTONE) 25 MG tablet, Take 0.5 tablets (12.5 mg total) by mouth daily., Disp: 15 tablet, Rfl: 3   thiamine 100 MG tablet, Take 1 tablet (100 mg total) by mouth daily., Disp: , Rfl:    torsemide (DEMADEX) 20 MG tablet, Take 20 mg by mouth daily., Disp: , Rfl:    traZODone (DESYREL) 50 MG tablet, Take 50 mg by mouth at bedtime as needed for sleep., Disp: , Rfl:  PAST MEDICAL HISTORY: Past Medical History:  Diagnosis Date   Biventricular heart failure, NYHA class 2 (HCC)    CHF (congestive heart failure) (HCC)    ETOH abuse    Hypertension    Liver cirrhosis (HCC)    VT (ventricular tachycardia) (HCC)     PAST SURGICAL HISTORY: Past Surgical History:  Procedure Laterality Date   ICD IMPLANT   05/18/2015   [employer] A219 Emblem MRI S-ICD implanted by Dr Mancel Bale in [place]   SUBQ ICD [place] N/A 12/03/2020   Procedure: SUBQ ICD CHANGEOUT;  Surgeon: Marinus Maw, MD;  Location: [facility] INVASIVE CV LAB;  Service: Cardiovascular;  Laterality: N/A;    FAMILY HISTORY: No family history on file.  SOCIAL HISTORY: Social History   Socioeconomic History   Marital status: Divorced    Spouse name: Not on file   Number of children: Not on file   Years of education: Not on file   Highest education level: Not on file  Occupational History   Not on file  Tobacco Use   Smoking status: Every Day    Current packs/day: 3.00    Types: Cigarettes   Smokeless tobacco: Current  Substance and Sexual Activity   Alcohol use: Yes    Comment: 2 gallons of Vodka   Drug use: Not on file   Sexual activity: Not on file  Other Topics Concern   Not on file  Social History Narrative   Not on file   Social Drivers of Health   Financial Resource Strain: Medium Risk (09/07/2020)   Overall Financial Resource Strain (CARDIA)    Difficulty of Paying Living Expenses: Somewhat hard  Food Insecurity: No Food Insecurity (09/07/2020)   Hunger Vital Sign    Worried About Running Out of Food in the Last Year: Never true    Ran Out of Food in the Last Year: Never true  Transportation Needs: Unmet Transportation Needs (09/08/2020)   PRAPARE - Administrator, Civil Service (Medical): Yes    Lack of Transportation (Non-Medical): Yes  Physical Activity: Not on file  Stress: Not on file  Social Connections: Not on file  Intimate Partner Violence: Not on file       PHYSICAL EXAM  There were no vitals filed for this visit.  There is no height or weight on file to calculate BMI.   General: The patient is well-developed and well-nourished and in no acute distress  HEENT:  Head is [place]/AT.  Sclera are anicteric.  Funduscopic exam shows normal optic discs and retinal  vessels.  Neck: No carotid bruits are noted.  The neck is nontender.  Cardiovascular: The heart has a regular rate and rhythm with a normal S1 and S2. There were no murmurs, gallops or rubs.    Skin: Extremities are without rash or  edema.  Musculoskeletal:  Back is nontender  Neurologic Exam  Mental status: The patient is alert and oriented x 3 at the time of the examination. The patient has apparent normal recent and remote memory, with an apparently normal attention span and concentration ability.   Speech is normal.  Cranial nerves: Extraocular movements are full. Pupils are equal, round, and reactive to light and accomodation.  Visual fields are full.  Facial symmetry is present. There is good facial sensation to soft touch bilaterally.Facial strength is normal.  Trapezius and sternocleidomastoid strength is normal. No dysarthria is noted.  The tongue is midline, and the patient has symmetric elevation of the  soft palate. No obvious hearing deficits are noted.  Motor:  Muscle bulk is normal.   Tone is normal. Strength is  5 / 5 in all 4 extremities.   Sensory: Sensory testing is intact to pinprick, soft touch and vibration sensation in all 4 extremities.  Coordination: Cerebellar testing reveals good finger-nose-finger and heel-to-shin bilaterally.  Gait and station: Station is normal.   Gait is normal. Tandem gait is normal. Romberg is negative.   Reflexes: Deep tendon reflexes are symmetric and normal bilaterally.   Plantar responses are flexor.    DIAGNOSTIC DATA (LABS, IMAGING, TESTING) - I reviewed patient records, labs, notes, testing and imaging myself where available.  Lab Results  Component Value Date   WBC 16.4 (H) 05/27/2023   HGB 12.2 (L) 05/27/2023   HCT 36.6 (L) 05/27/2023   MCV 84.1 05/27/2023   PLT 206 05/27/2023      Component Value Date/Time   NA 134 (L) 05/27/2023 1403   NA 136 11/26/2020 1132   K 4.1 05/27/2023 1403   CL 102 05/27/2023 1403   CO2  23 05/27/2023 1403   GLUCOSE 164 (H) 05/27/2023 1403   BUN 12 05/27/2023 1403   BUN 27 (H) 11/26/2020 1132   CREATININE 0.90 05/27/2023 1403   CALCIUM 8.5 (L) 05/27/2023 1403   PROT 6.5 01/04/2023 1958   ALBUMIN 2.1 (L) 01/04/2023 1958   AST 32 01/04/2023 1958   ALT 13 01/04/2023 1958   ALKPHOS 126 01/04/2023 1958   BILITOT 0.7 01/04/2023 1958   GFRNONAA >60 05/27/2023 1403   Lab Results  Component Value Date   CHOL 153 03/15/2023   HDL 57 03/15/2023   LDLCALC 85 03/15/2023   TRIG 54 03/15/2023   CHOLHDL 2.7 03/15/2023   Lab Results  Component Value Date   HGBA1C 6.6 (H) 09/09/2020   No results found for: "VITAMINB12" Lab Results  Component Value Date   TSH 4.715 (H) 09/04/2020       ASSESSMENT AND PLAN  ***   Laurisa Sahakian A. Epimenio Foot, MD, [facility] 06/06/2023, 10:28 PM Certified in Neurology, Clinical Neurophysiology, Sleep Medicine and Neuroimaging  [facility] [address], [address] [place], [place] [address] [phone]

## 2023-06-07 ENCOUNTER — Encounter: Payer: Self-pay | Admitting: Neurology

## 2023-06-07 ENCOUNTER — Ambulatory Visit (INDEPENDENT_AMBULATORY_CARE_PROVIDER_SITE_OTHER): Payer: Medicare Other | Admitting: Neurology

## 2023-06-07 VITALS — BP 133/85 | HR 87 | Ht 72.0 in | Wt 192.0 lb

## 2023-06-07 DIAGNOSIS — H469 Unspecified optic neuritis: Secondary | ICD-10-CM | POA: Diagnosis not present

## 2023-06-07 DIAGNOSIS — F1011 Alcohol abuse, in remission: Secondary | ICD-10-CM | POA: Diagnosis not present

## 2023-06-07 DIAGNOSIS — I5082 Biventricular heart failure: Secondary | ICD-10-CM | POA: Diagnosis not present

## 2023-06-07 DIAGNOSIS — I428 Other cardiomyopathies: Secondary | ICD-10-CM

## 2023-06-09 ENCOUNTER — Encounter: Payer: Self-pay | Admitting: Neurology

## 2023-06-09 LAB — NEUROMYELITIS OPTICA AUTOAB, IGG

## 2023-06-09 LAB — ANTI-MOG, SERUM: MOG Antibody, Cell-based IFA: NEGATIVE

## 2023-06-09 LAB — ANGIOTENSIN CONVERTING ENZYME: Angio Convert Enzyme: 67 U/L (ref 14–82)

## 2023-06-11 ENCOUNTER — Other Ambulatory Visit (HOSPITAL_COMMUNITY): Payer: Self-pay | Admitting: Cardiology

## 2023-07-11 NOTE — Progress Notes (Signed)
 "    NEURO-OPHTHALMOLOGY CLINIC NOTE    Patient: Darryl Mitchell  MRN: [id number]  DOB: 1975-07-28   Referring physician: No referring provider defined for this encounter.     CHIEF COMPLAINT Follow Up Exam    HISTORY OF PRESENT ILLNESS: Abimelec Grochowski is a 48 y.o. old male who presents to the clinic today for neuro-ophthalmology consultation.  Pt reports that in the past 8 months, his vision has been blurry in both eyes, more centrally, and has been relying on peripheral vision. He has had some paresthesias in his fingers and toes. He has had some headaches. Reports good diet, not currently using alcohol or smoking or substances.   Hx of cirrhosis, alcohol use disorder, tobacco use, heart failure s/p ICD, iris colobomas  MRI brain/orbit in 03/2023 unrevealing   Saw Dr Cheree last month, normal retina   Today, the patient reports his vision has not changed. He does report that his diet and alcohol consumption was most likely worse in September 2024 when his vision changes started. He thinks the vision changes were acute, but no pain. No focal neurologic deficits otherwise. His sister has multiple sclerosis. He saw Dr Vear a few days after he saw me, who ordered NMO, MOG and ACE labs, which were all normal. He recommended continuing thiamine .   Normal labs: Vitamins B12, A, B1 (84, lower end of normal), folate, copper, zinc, heavy metal screening We do not have prior labs to compare to      MEDICATIONS No current outpatient medications on file prior to visit. (Ophthalmic Drugs)    Current Outpatient Medications on File Prior to Visit (Other)  Medication Sig   amLODIPine (NORVASC) 2.5 mg tablet Take 2.5 mg by mouth daily.   HYDROcodone -acetaminophen  (NORCO) 5-325 mg per tablet Take 1 tablet by mouth.   Jardiance  10 mg tab Take 10 mg by mouth daily.   lactulose  (CHRONULAC ) 10 g/15 mL soln solution Take 20 g by mouth daily.   magnesium  oxide 400 mg (241 mg magnesium ) tab Take 400  mg by mouth daily.   olmesartan (BENICAR) 20 mg tablet Take 20 mg by mouth daily.   oxyCODONE -acetaminophen  (PERCOCET) 7.5-325 mg per tablet Take 1 tablet by mouth.   spironolactone  (ALDACTONE ) 25 mg tablet Take 12.5 mg by mouth daily.   torsemide  (DEMADEX ) 20 mg tablet Take 20 mg by mouth daily.   traZODone (DESYREL) 50 mg tablet Take 50 mg by mouth at bedtime.    REVIEW OF SYSTEMS: Other than as described above, the review of system was negative in detail.  ALLERGIES Allergies  Allergen Reactions   Lisinopril Swelling    PAST MEDICAL HISTORY Past Medical History:  Diagnosis Date   Diabetes mellitus    (CMD)    Eye trauma    Hypertension    Optic atrophy    Optic neuritis    History reviewed. No pertinent surgical history.  FAMILY HISTORY Family History  Problem Relation Name Age of Onset   Hypertension Mother     Cancer Maternal Aunt      SOCIAL HISTORY Social History   Tobacco Use   Smoking status: Former    Current packs/day: 0.00    Types: Cigarettes    Quit date: 03/29/2023    Years since quitting: 0.2   Smokeless tobacco: Never         Neuro-Ophthalmological Examination     Base Eye Exam     Visual Acuity (Snellen - Linear)       Right  Left   Dist [place] 20/300 20/300   Dist ph [place] 20/70 +1 20/70 +1         Tonometry (Tonopen: Tetracaine OU, 2:14 PM)       Right Left   Pressure 11 10         Pupils       APD   Right None   Left None         Visual Fields       Left Right    Full Full         Neuro/Psych     Oriented x3: Yes   Mood/Affect: Normal           Additional Tests     Color       Right Left   [place] 13/14 12/14           Slit Lamp and Fundus Exam     External Exam       Right Left   External Normal Normal         Slit Lamp Exam       Right Left   Lids/Lashes Normal Normal   Conjunctiva/Sclera White and quiet White and quiet   Cornea Clear Clear   Anterior Chamber  Deep and quiet Deep and quiet   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 His nutritional labs are wnl now but we do not have prior testing. The acute onset that the patient reports is intriguing though, and makes me consider toxic optic neuropathy or Leber's Hereditary ON with triggering onset from alcohol. We will check syphilis and HIV to rule out these occult etiologies and I offered the option of genetic testing. He will think about it. Importantly, his exam and testing are stable and he would benefit greatly from an updated refraction as his BCVA is 20/70 (from 20/300 without pinhole). Follow up with me in 3 months with repeat testing.   We explained the diagnosis, plan, and importance of appropriate follow up with the patient and they expressed understanding. Mr. Partington understands that he can call us  with any questions about his evaluation, and understands the importance of calling his doctors with any change in symptoms.  We will endeavor to keep his doctors informed of any additional visits or test results.      ICD-10-CM   1. Optic neuropathy  H46.9 Humphrey Visual Field - OU - Both Eyes    OCT, Optic Nerve - OU - Both Eyes    HIV Screen with Reflex to Confirmation    Rapid Plasma Reagin (RPR), Qualitative Test with Reflex to Titer    HIV Screen with Reflex to Confirmation    Rapid  Plasma Reagin (RPR), Qualitative Test with Reflex to  Titer    CANCELED: HIV Screen with Reflex to Confirmation    CANCELED: Rapid Plasma Reagin (RPR), Qualitative Test with Reflex to Titer             On the day of the visit I spent 46 minutes preparing to see the patient, obtaining and/or reviewing separately obtained history, performing a medically appropriate examination and evaluation, counseling and educating the patient/caregiver, ordering medications, test, or procedures, referring and communicating with other health care professionals (not separately reported), documenting clinical information in the electronic medical record, and individually interpreting results (not separately reported) and communicating results to the patient/caregiver.This time does not include any time spent performing procedures or assesments that are separately billable.     Electronically signed by: Gelene Door, MD 07/11/2023 4:01 PM  *This note was dictated with voice recognition software.   "
